# Patient Record
Sex: Male | Born: 1940 | Race: White | Hispanic: No | Marital: Married | Smoking: Former smoker
Health system: Southern US, Community
[De-identification: ages and names within clinical notes are randomized; demographics above are authoritative.]

## PROBLEM LIST (undated history)

## (undated) DIAGNOSIS — I5042 Chronic combined systolic (congestive) and diastolic (congestive) heart failure: Secondary | ICD-10-CM

## (undated) DIAGNOSIS — I447 Left bundle-branch block, unspecified: Secondary | ICD-10-CM

## (undated) DIAGNOSIS — Z95810 Presence of automatic (implantable) cardiac defibrillator: Secondary | ICD-10-CM

## (undated) DIAGNOSIS — I428 Other cardiomyopathies: Secondary | ICD-10-CM

## (undated) DIAGNOSIS — E119 Type 2 diabetes mellitus without complications: Secondary | ICD-10-CM

---

## 2000-11-13 ENCOUNTER — Other Ambulatory Visit: Admission: RE | Admit: 2000-11-13 | Discharge: 2000-11-13 | Payer: Self-pay | Admitting: Internal Medicine

## 2003-11-23 ENCOUNTER — Ambulatory Visit (HOSPITAL_COMMUNITY): Admission: RE | Admit: 2003-11-23 | Discharge: 2003-11-23 | Payer: Self-pay | Admitting: Internal Medicine

## 2006-07-07 ENCOUNTER — Ambulatory Visit: Payer: Self-pay | Admitting: Gastroenterology

## 2006-07-31 ENCOUNTER — Ambulatory Visit: Payer: Self-pay | Admitting: Gastroenterology

## 2006-07-31 ENCOUNTER — Encounter: Payer: Self-pay | Admitting: Gastroenterology

## 2006-07-31 ENCOUNTER — Ambulatory Visit (HOSPITAL_COMMUNITY): Admission: RE | Admit: 2006-07-31 | Discharge: 2006-07-31 | Payer: Self-pay | Admitting: Gastroenterology

## 2009-08-23 ENCOUNTER — Encounter (INDEPENDENT_AMBULATORY_CARE_PROVIDER_SITE_OTHER): Payer: Self-pay

## 2010-04-02 NOTE — Letter (Signed)
Summary: Recall Colonoscopy/Endoscopy, Change to Office Visit  [facility]  [address]   [place], [place] [address]   Phone: [phone]  Fax: [phone]      August 23, 2009   Richard Martin [address] [place], [place]  [address] 14-Apr-1940   Dear [name],   According to our records, it is time for you to schedule a Colonoscopy/Endoscopy. However, after reviewing your medical record, we recommend an office visit in order to determine your need for a repeat procedure.  Please call [phone] at your convenience to schedule an office visit. If you have any questions or concerns, please feel free to contact our office.   Sincerely,   Cloria Spring LPN  [facility] Ph: [phone]   Fax: [phone]

## 2010-06-20 ENCOUNTER — Other Ambulatory Visit: Payer: Self-pay | Admitting: Dermatology

## 2010-07-16 NOTE — Op Note (Signed)
NAMEGERROD, MAULE              ACCOUNT NO.:  [id number]   MEDICAL RECORD NO.:  [id number]          PATIENT TYPE:  AMB   LOCATION:  DAY                           FACILITY:  [facility]   PHYSICIAN:  Kassie Mends, M.D.      DATE OF BIRTH:  1941-02-23   DATE OF PROCEDURE:  07/31/2006  DATE OF DISCHARGE:                               OPERATIVE REPORT   PROCEDURE:  Colonoscopy with cold forceps colonoscopy, cold snare  polypectomy and snare cautery polypectomy.   INDICATIONS FOR EXAM:  Richard Martin is a 70 year old male who presented  with rectal bleeding. He has never had a colonoscopy.   FINDINGS:  1. 3 mm rectal polyp removed via cold forceps.  2. 6 mm sessile rectal polyp removed via snare cautery.  3. 8 mm transverse colon polyp removed via snare cautery but not      retrieved.  4. 6 mm pedunculated ascending colon polyp removed via snare cautery.  5. 6 mm sessile ascending colon polyp removed via snare cautery.  6. 8 mm sessile descending colon polyp removed via snare cautery.  7. 1.5 cm sessile rectal polyp removed via snare cautery. This polyp      is the likely source of his rectal bleeding. The surrounding mucosa      was discolored and biopsies of the surrounding mucosa were obtained      via cold forceps.  The underlying mucosa was tattooed with 3 mL of      [place] ink.  8. Two additional 3-4 mm rectal polyps removed via cold snare.  No      internal hemorrhoids.   RECOMMENDATIONS:  1. Low residue diet for seven days, then high fiber diet.  [name]      was given information on low residue diet, high fiber diet and      polyps.  2. Will call Mr. Perfect with his biopsy results.  3. Screening colonoscopy in three years.  4. No aspirin, NSAIDs, or anticoagulation for seven days.   MEDICATIONS:  1. Demerol 50 mg IV.  2. Versed 3 mg IV.   PROCEDURE TECHNIQUE:  Physical exam was performed and informed consent  was obtained from the patient after explaining the benefits,  risks and  alternatives to the procedure. The patient was connected to the monitor  and placed in left lateral position. Continuous oxygen was provided via  nasal cannula and IV medicines administered through an indwelling  cannula.  After administration of sedation and rectal exam, the  patient's rectum was intubated  and the scope was advanced under direct visualization to the cecum. The  scope was subsequently removed slowly by carefully examining the color,  texture, anatomy and integrity of the mucosa on the way out.  The  patient was recovered from endoscopy and discharged home in satisfactory  condition.      Kassie Mends, M.D.  Electronically Signed     SM/MEDQ  D:  07/31/2006  T:  07/31/2006  Job:  [id number]   cc:   Catalina Pizza, M.D.  Fax: [phone]

## 2010-07-16 NOTE — Consult Note (Signed)
Richard Martin, Richard Martin              ACCOUNT NO.:  [id number]   MEDICAL RECORD NO.:  [id number]         PATIENT TYPE:  AMB   LOCATION:  DAY                           FACILITY:  [facility]   PHYSICIAN:  Kassie Mends, M.D.      DATE OF BIRTH:  10/22/1940   DATE OF CONSULTATION:  07/07/2006  DATE OF DISCHARGE:                                 CONSULTATION   REFERRING PHYSICIAN:  Catalina Pizza, M.D.   REASON FOR CONSULTATION:  Rectal bleeding.   HISTORY OF PRESENT ILLNESS:  Richard Martin is a 70 year old male who has a  history of hemorrhoids.  He complains of rare rectal bleeding which he  attributes to his hemorrhoids over the years.  He has never had a  colonoscopy.  He denies any abdominal pain, problems swallowing,  heartburn, indigestion, change in his bowel habits, or black tarry  stools.   PAST MEDICAL HISTORY:  1. Diabetes controlled with diet.  2. Hyperlipidemia.   PAST SURGICAL HISTORY:  Tonsillectomy.   ALLERGIES:  CRAB.   MEDICATIONS:  1. Crestor 5 mg daily.  2. Aspirin 81 mg daily.  3. Multivitamin.   FAMILY HISTORY:  He has no family history of colon cancer or colon  polyps.   SOCIAL HISTORY:  He is married and retired since age 1.  He used to  work for a tobacco company.  He used to smoke but not now.  He  occasionally uses alcohol.   REVIEW OF SYSTEMS:  Per the HPI otherwise all systems are negative.   PHYSICAL EXAM:  VITAL SIGNS:  Weight 155 pounds, height 5 feet 10  inches, temperature 97.7, blood pressure was 122/80, pulse 61.  GENERAL:  He is in no apparent distress, alert and oriented x4.  HEENT:  Exam is  atraumatic, normocephalic.  Pupils equal, react to light.  Mouth no oral  lesions.  Posterior pharynx without erythema or exudate.  NECK:  Full range of motion.  No lymphadenopathy.  LUNGS:  Clear to  auscultation bilaterally.  CARDIOVASCULAR:  Regular rhythm, no murmur,  normal S1 and S2.  ABDOMEN:  Bowel sounds present, soft, nontender, nondistended.  No  rebound or guarding.  EXTREMITIES:  Without cyanosis, clubbing or edema.  NEURO:  He is in no focal neurologic deficits.   LABS:  Labs were drawn April2008: Hemoglobin 14.5, platelets 261. BUN  15, creatinine 0.65, albumin 4.3.   ASSESSMENT:  Richard Martin is 70 year old male who has rare rectal  bleeding which likely secondary to hemorrhoids but the differential  diagnosis includes colorectal polyp and a low likelihood of colorectal  malignancy.  Thank you for allowing me to see Richard Martin in  consultation.  My recommendations follow.   RECOMMENDATIONS:  1. Richard Martin will be scheduled for colonoscopy after May19 with a      HalfLytely bowel prep.  2. He may follow up with me as needed.      Kassie Mends, M.D.  Electronically Signed     SM/MEDQ  D:  07/07/2006  T:  07/07/2006  Job:  [id number]   cc:   Catalina Pizza, M.D.  Fax: [phone]

## 2011-06-05 DIAGNOSIS — L570 Actinic keratosis: Secondary | ICD-10-CM | POA: Diagnosis not present

## 2011-07-29 DIAGNOSIS — Z87891 Personal history of nicotine dependence: Secondary | ICD-10-CM | POA: Diagnosis not present

## 2011-07-29 DIAGNOSIS — Z136 Encounter for screening for cardiovascular disorders: Secondary | ICD-10-CM | POA: Diagnosis not present

## 2011-07-29 DIAGNOSIS — E119 Type 2 diabetes mellitus without complications: Secondary | ICD-10-CM | POA: Diagnosis not present

## 2011-07-29 DIAGNOSIS — E785 Hyperlipidemia, unspecified: Secondary | ICD-10-CM | POA: Diagnosis not present

## 2011-07-29 DIAGNOSIS — I10 Essential (primary) hypertension: Secondary | ICD-10-CM | POA: Diagnosis not present

## 2011-09-03 DIAGNOSIS — S81809A Unspecified open wound, unspecified lower leg, initial encounter: Secondary | ICD-10-CM | POA: Diagnosis not present

## 2011-09-03 DIAGNOSIS — B361 Tinea nigra: Secondary | ICD-10-CM | POA: Diagnosis not present

## 2011-12-05 ENCOUNTER — Other Ambulatory Visit: Payer: Self-pay | Admitting: Dermatology

## 2011-12-05 DIAGNOSIS — D485 Neoplasm of uncertain behavior of skin: Secondary | ICD-10-CM | POA: Diagnosis not present

## 2011-12-05 DIAGNOSIS — Z85828 Personal history of other malignant neoplasm of skin: Secondary | ICD-10-CM | POA: Diagnosis not present

## 2011-12-05 DIAGNOSIS — D239 Other benign neoplasm of skin, unspecified: Secondary | ICD-10-CM | POA: Diagnosis not present

## 2011-12-05 DIAGNOSIS — L570 Actinic keratosis: Secondary | ICD-10-CM | POA: Diagnosis not present

## 2011-12-05 DIAGNOSIS — L919 Hypertrophic disorder of the skin, unspecified: Secondary | ICD-10-CM | POA: Diagnosis not present

## 2011-12-05 DIAGNOSIS — L909 Atrophic disorder of skin, unspecified: Secondary | ICD-10-CM | POA: Diagnosis not present

## 2011-12-05 DIAGNOSIS — L821 Other seborrheic keratosis: Secondary | ICD-10-CM | POA: Diagnosis not present

## 2011-12-31 DIAGNOSIS — Z23 Encounter for immunization: Secondary | ICD-10-CM | POA: Diagnosis not present

## 2012-01-27 DIAGNOSIS — G609 Hereditary and idiopathic neuropathy, unspecified: Secondary | ICD-10-CM | POA: Diagnosis not present

## 2012-01-27 DIAGNOSIS — G589 Mononeuropathy, unspecified: Secondary | ICD-10-CM | POA: Diagnosis not present

## 2012-01-27 DIAGNOSIS — I10 Essential (primary) hypertension: Secondary | ICD-10-CM | POA: Diagnosis not present

## 2012-01-27 DIAGNOSIS — E119 Type 2 diabetes mellitus without complications: Secondary | ICD-10-CM | POA: Diagnosis not present

## 2012-01-27 DIAGNOSIS — Z125 Encounter for screening for malignant neoplasm of prostate: Secondary | ICD-10-CM | POA: Diagnosis not present

## 2012-01-27 DIAGNOSIS — E782 Mixed hyperlipidemia: Secondary | ICD-10-CM | POA: Diagnosis not present

## 2012-09-17 DIAGNOSIS — E119 Type 2 diabetes mellitus without complications: Secondary | ICD-10-CM | POA: Diagnosis not present

## 2012-09-17 DIAGNOSIS — I10 Essential (primary) hypertension: Secondary | ICD-10-CM | POA: Diagnosis not present

## 2012-09-17 DIAGNOSIS — H35379 Puckering of macula, unspecified eye: Secondary | ICD-10-CM | POA: Diagnosis not present

## 2012-09-17 DIAGNOSIS — E785 Hyperlipidemia, unspecified: Secondary | ICD-10-CM | POA: Diagnosis not present

## 2012-09-29 DIAGNOSIS — H35379 Puckering of macula, unspecified eye: Secondary | ICD-10-CM | POA: Diagnosis not present

## 2012-09-29 DIAGNOSIS — H524 Presbyopia: Secondary | ICD-10-CM | POA: Diagnosis not present

## 2012-12-08 ENCOUNTER — Other Ambulatory Visit: Payer: Self-pay | Admitting: Dermatology

## 2012-12-08 DIAGNOSIS — L820 Inflamed seborrheic keratosis: Secondary | ICD-10-CM | POA: Diagnosis not present

## 2012-12-08 DIAGNOSIS — Z85828 Personal history of other malignant neoplasm of skin: Secondary | ICD-10-CM | POA: Diagnosis not present

## 2012-12-08 DIAGNOSIS — L570 Actinic keratosis: Secondary | ICD-10-CM | POA: Diagnosis not present

## 2012-12-08 DIAGNOSIS — D692 Other nonthrombocytopenic purpura: Secondary | ICD-10-CM | POA: Diagnosis not present

## 2012-12-08 DIAGNOSIS — D485 Neoplasm of uncertain behavior of skin: Secondary | ICD-10-CM | POA: Diagnosis not present

## 2012-12-08 DIAGNOSIS — C44320 Squamous cell carcinoma of skin of unspecified parts of face: Secondary | ICD-10-CM | POA: Diagnosis not present

## 2012-12-08 DIAGNOSIS — L821 Other seborrheic keratosis: Secondary | ICD-10-CM | POA: Diagnosis not present

## 2012-12-20 DIAGNOSIS — Z85828 Personal history of other malignant neoplasm of skin: Secondary | ICD-10-CM | POA: Diagnosis not present

## 2012-12-20 DIAGNOSIS — C44320 Squamous cell carcinoma of skin of unspecified parts of face: Secondary | ICD-10-CM | POA: Diagnosis not present

## 2013-02-16 DIAGNOSIS — Z23 Encounter for immunization: Secondary | ICD-10-CM | POA: Diagnosis not present

## 2013-03-23 DIAGNOSIS — E782 Mixed hyperlipidemia: Secondary | ICD-10-CM | POA: Diagnosis not present

## 2013-03-23 DIAGNOSIS — Z125 Encounter for screening for malignant neoplasm of prostate: Secondary | ICD-10-CM | POA: Diagnosis not present

## 2013-03-23 DIAGNOSIS — E119 Type 2 diabetes mellitus without complications: Secondary | ICD-10-CM | POA: Diagnosis not present

## 2013-03-30 DIAGNOSIS — E782 Mixed hyperlipidemia: Secondary | ICD-10-CM | POA: Diagnosis not present

## 2013-03-30 DIAGNOSIS — E119 Type 2 diabetes mellitus without complications: Secondary | ICD-10-CM | POA: Diagnosis not present

## 2013-09-05 DIAGNOSIS — E782 Mixed hyperlipidemia: Secondary | ICD-10-CM | POA: Diagnosis not present

## 2013-09-05 DIAGNOSIS — E119 Type 2 diabetes mellitus without complications: Secondary | ICD-10-CM | POA: Diagnosis not present

## 2013-09-07 DIAGNOSIS — E782 Mixed hyperlipidemia: Secondary | ICD-10-CM | POA: Diagnosis not present

## 2013-09-07 DIAGNOSIS — I10 Essential (primary) hypertension: Secondary | ICD-10-CM | POA: Diagnosis not present

## 2013-09-07 DIAGNOSIS — E119 Type 2 diabetes mellitus without complications: Secondary | ICD-10-CM | POA: Diagnosis not present

## 2013-11-28 DIAGNOSIS — Z23 Encounter for immunization: Secondary | ICD-10-CM | POA: Diagnosis not present

## 2013-11-28 DIAGNOSIS — E119 Type 2 diabetes mellitus without complications: Secondary | ICD-10-CM | POA: Diagnosis not present

## 2013-12-16 DIAGNOSIS — J029 Acute pharyngitis, unspecified: Secondary | ICD-10-CM | POA: Diagnosis not present

## 2013-12-16 DIAGNOSIS — J069 Acute upper respiratory infection, unspecified: Secondary | ICD-10-CM | POA: Diagnosis not present

## 2014-02-02 ENCOUNTER — Other Ambulatory Visit: Payer: Self-pay | Admitting: Dermatology

## 2014-02-02 DIAGNOSIS — D1801 Hemangioma of skin and subcutaneous tissue: Secondary | ICD-10-CM | POA: Diagnosis not present

## 2014-02-02 DIAGNOSIS — D485 Neoplasm of uncertain behavior of skin: Secondary | ICD-10-CM | POA: Diagnosis not present

## 2014-02-02 DIAGNOSIS — Z85828 Personal history of other malignant neoplasm of skin: Secondary | ICD-10-CM | POA: Diagnosis not present

## 2014-02-02 DIAGNOSIS — L821 Other seborrheic keratosis: Secondary | ICD-10-CM | POA: Diagnosis not present

## 2014-02-02 DIAGNOSIS — L820 Inflamed seborrheic keratosis: Secondary | ICD-10-CM | POA: Diagnosis not present

## 2014-02-02 DIAGNOSIS — L570 Actinic keratosis: Secondary | ICD-10-CM | POA: Diagnosis not present

## 2014-03-08 DIAGNOSIS — E119 Type 2 diabetes mellitus without complications: Secondary | ICD-10-CM | POA: Diagnosis not present

## 2014-03-08 DIAGNOSIS — E785 Hyperlipidemia, unspecified: Secondary | ICD-10-CM | POA: Diagnosis not present

## 2014-03-15 DIAGNOSIS — E1165 Type 2 diabetes mellitus with hyperglycemia: Secondary | ICD-10-CM | POA: Diagnosis not present

## 2014-03-15 DIAGNOSIS — E782 Mixed hyperlipidemia: Secondary | ICD-10-CM | POA: Diagnosis not present

## 2014-06-21 DIAGNOSIS — E1165 Type 2 diabetes mellitus with hyperglycemia: Secondary | ICD-10-CM | POA: Diagnosis not present

## 2014-06-21 DIAGNOSIS — E782 Mixed hyperlipidemia: Secondary | ICD-10-CM | POA: Diagnosis not present

## 2014-06-28 DIAGNOSIS — E1165 Type 2 diabetes mellitus with hyperglycemia: Secondary | ICD-10-CM | POA: Diagnosis not present

## 2014-06-28 DIAGNOSIS — E782 Mixed hyperlipidemia: Secondary | ICD-10-CM | POA: Diagnosis not present

## 2014-10-25 DIAGNOSIS — E1165 Type 2 diabetes mellitus with hyperglycemia: Secondary | ICD-10-CM | POA: Diagnosis not present

## 2014-10-25 DIAGNOSIS — E782 Mixed hyperlipidemia: Secondary | ICD-10-CM | POA: Diagnosis not present

## 2014-10-25 DIAGNOSIS — E559 Vitamin D deficiency, unspecified: Secondary | ICD-10-CM | POA: Diagnosis not present

## 2014-11-01 DIAGNOSIS — E119 Type 2 diabetes mellitus without complications: Secondary | ICD-10-CM | POA: Diagnosis not present

## 2014-11-01 DIAGNOSIS — E782 Mixed hyperlipidemia: Secondary | ICD-10-CM | POA: Diagnosis not present

## 2014-12-04 ENCOUNTER — Emergency Department (HOSPITAL_COMMUNITY)
Admission: EM | Admit: 2014-12-04 | Discharge: 2014-12-04 | Disposition: A | Discharge status: Home / Self Care | Payer: Medicare Other | Attending: Emergency Medicine | Admitting: Emergency Medicine

## 2014-12-04 ENCOUNTER — Emergency Department (HOSPITAL_COMMUNITY)
Admission: EM | Admit: 2014-12-04 | Discharge: 2014-12-04 | Discharge status: Absent without leave | Payer: Self-pay | Source: Home / Self Care

## 2014-12-04 ENCOUNTER — Encounter (HOSPITAL_COMMUNITY): Payer: Self-pay | Admitting: Emergency Medicine

## 2014-12-04 DIAGNOSIS — Z87891 Personal history of nicotine dependence: Secondary | ICD-10-CM | POA: Insufficient documentation

## 2014-12-04 DIAGNOSIS — W228XXA Striking against or struck by other objects, initial encounter: Secondary | ICD-10-CM | POA: Insufficient documentation

## 2014-12-04 DIAGNOSIS — Y998 Other external cause status: Secondary | ICD-10-CM | POA: Diagnosis not present

## 2014-12-04 DIAGNOSIS — Y9289 Other specified places as the place of occurrence of the external cause: Secondary | ICD-10-CM | POA: Insufficient documentation

## 2014-12-04 DIAGNOSIS — S0591XA Unspecified injury of right eye and orbit, initial encounter: Secondary | ICD-10-CM | POA: Diagnosis present

## 2014-12-04 DIAGNOSIS — E119 Type 2 diabetes mellitus without complications: Secondary | ICD-10-CM | POA: Insufficient documentation

## 2014-12-04 DIAGNOSIS — Y9389 Activity, other specified: Secondary | ICD-10-CM | POA: Insufficient documentation

## 2014-12-04 DIAGNOSIS — Z23 Encounter for immunization: Secondary | ICD-10-CM | POA: Diagnosis not present

## 2014-12-04 DIAGNOSIS — H1131 Conjunctival hemorrhage, right eye: Secondary | ICD-10-CM | POA: Diagnosis not present

## 2014-12-04 DIAGNOSIS — Z791 Long term (current) use of non-steroidal anti-inflammatories (NSAID): Secondary | ICD-10-CM | POA: Insufficient documentation

## 2014-12-04 HISTORY — DX: Type 2 diabetes mellitus without complications: E11.9

## 2014-12-04 MED ORDER — TETANUS-DIPHTH-ACELL PERTUSSIS 5-2.5-18.5 LF-MCG/0.5 IM SUSP
0.5000 mL | Freq: Once | INTRAMUSCULAR | Status: AC
  Administered 2014-12-04: 0.5 mL via INTRAMUSCULAR
  Filled 2014-12-04: qty 0.5

## 2014-12-04 MED ORDER — FLUORESCEIN SODIUM 1 MG OP STRP
1.0000 | ORAL_STRIP | Freq: Once | OPHTHALMIC | Status: AC
  Administered 2014-12-04: 1 via OPHTHALMIC
  Filled 2014-12-04: qty 1

## 2014-12-04 MED ORDER — FLUORESCEIN SODIUM 1 MG OP STRP
ORAL_STRIP | OPHTHALMIC | Status: AC
  Filled 2014-12-04: qty 1

## 2014-12-04 MED ORDER — MOXIFLOXACIN HCL 0.5 % OP SOLN: 1.0000 [drp] | Freq: Three times a day (TID) | OPHTHALMIC | Status: DC

## 2014-12-04 NOTE — ED Provider Notes (Signed)
CSN: [id number]     Arrival date & time 12/04/14  1810 History  By signing my name below, I, Erling Conte, attest that this documentation has been prepared under the direction and in the presence of Ottie Glazier PA-C Electronically Signed: Erling Conte, ED Scribe. 12/04/2014. 9:55 PM.    Chief Complaint  Patient presents with  . Eye Injury    The history is provided by the patient. No language interpreter was used.    HPI Comments: Richard Martin is a 74 y.o. male with a h/o DM who presents to the Emergency Department complaining of sudden onset right eye injury onset just PTA. He states he hit his eye with with a possible branch or thorn from a plant that he was picking up. He reports associated eye redness. Pt does not wear glasses or contacts on a regular basis. He has not had any meds PTA. He endorses that the pain is exacerbated with eye movement. Pt states nothing makes the pain better. He denies any sensation of foreign body in right eye. Pt's tetanus status is not UTD. Pt takes 81 mg of aspirin daily. He denies any vision changes or facial swelling.  Past Medical History  Diagnosis Date  . Diabetes mellitus without complication ([place])    History reviewed. No pertinent past surgical history. No family history on file. Social History  Substance Use Topics  . Smoking status: Former Smoker    Types: Cigarettes  . Smokeless tobacco: None  . Alcohol Use: No    Review of Systems  HENT: Negative for facial swelling.   Eyes: Positive for pain and redness. Negative for visual disturbance.      Allergies  Crab  Home Medications   Prior to Admission medications   Medication Sig Start Date End Date Taking? Authorizing Provider  moxifloxacin (VIGAMOX) 0.5 % ophthalmic solution Place 1 drop into the right eye 3 (three) times daily. 12/04/14   Ottie Glazier, PA-C   Triage Vitals: BP 122/73 mmHg  Pulse 77  Temp(Src) 97.7 F (36.5 C) (Oral)  Resp 18  Ht 5\' 10"   (1.778 m)  Wt 140 lb 6.4 oz (63.685 kg)  BMI 20.15 kg/m2  SpO2 99%  Physical Exam  Constitutional: He is oriented to person, place, and time. He appears well-developed and well-nourished. No distress.  HENT:  Head: Normocephalic and atraumatic.  Eyes: EOM and lids are normal. Pupils are equal, round, and reactive to light. Right eye exhibits no discharge. No foreign body present in the right eye. Right conjunctiva has a hemorrhage. Left conjunctiva has no hemorrhage. Right eye exhibits normal extraocular motion. Left eye exhibits normal extraocular motion. Right pupil is round and reactive. Left pupil is round and reactive. Pupils are equal.  Slit lamp exam:      The right eye shows no hyphema.  Right eye: Conjunctiva is erythematous with hematoma on the lateral side but no corneal or pupil involvement. No hyphema. No globe injury. No foreign body could be seen.  The eye was stained with fluorescein after tetracaine was applied. There was no corneal abrasion or uptake noted.    Neck: Neck supple. No tracheal deviation present.  Cardiovascular: Normal rate.   Pulmonary/Chest: Effort normal. No respiratory distress.  Musculoskeletal: Normal range of motion.  Neurological: He is alert and oriented to person, place, and time.  Skin: Skin is warm and dry.  Psychiatric: He has a normal mood and affect. His behavior is normal.  Nursing note and vitals reviewed.  ED Course  Procedures (including critical care time) Labs Review Labs Reviewed - No data to display  Imaging Review No results found.   EKG Interpretation None       Visual Acuity  Right Eye Distance: 20/30 Left Eye Distance: 20/30 Bilateral Distance:    Right Eye Near:   Left Eye Near:    Bilateral Near:     MDM   Final diagnoses:  Conjunctival hemorrhage of right eye  Patient present for right injury while lifting plants today. There is no obvious globe injury or hyphema. He has a subconjunctival hemorrhage  with possible foreign body. I discussed this patient with Dr. Mingo Amber who has seen and evaluated the patient. The patient is not in pain now. He has no vision changes. He has no corneal or iris involvement. I spoke to Dr. Ellie Lunch, with ophthalmology, who recommended putting the patient on moxifloxacin and having him follow-up in the office tomorrow at 8:30 AM. I discussed the plan with the patient and he verbally agrees. I personally performed the services described in this documentation, which was scribed in my presence. The recorded information has been reviewed and is accurate.   Ottie Glazier, PA-C 12/04/14 2155  Evelina Bucy, MD 12/05/14 [phone]

## 2014-12-04 NOTE — ED Notes (Signed)
Pt left with all belongings and ambulated out of treatment area with wife.

## 2014-12-04 NOTE — ED Notes (Signed)
Pt reports he hit his eye with a stick. Sclera is dark red and inflamed. Pt is diabetic. Denies vision changes.

## 2014-12-04 NOTE — Discharge Instructions (Signed)
Subconjunctival Hemorrhage Follow-up with Dr. Ellie Lunch at 8:30 AM tomorrow morning. Take anabiotic says prescribed. Return for vision changes. A subconjunctival hemorrhage is a bright red patch covering a portion of the white of the eye. The white part of the eye is called the sclera, and it is covered by a thin membrane called the conjunctiva. This membrane is clear, except for tiny blood vessels that you can see with the naked eye. When your eye is irritated or inflamed and becomes red, it is because the vessels in the conjunctiva are swollen. Sometimes, a blood vessel in the conjunctiva can break and bleed. When this occurs, the blood builds up between the conjunctiva and the sclera, and spreads out to create a red area. The red spot may be very small at first. It may then spread to cover a larger part of the surface of the eye, or even all of the visible white part of the eye. In almost all cases, the blood will go away and the eye will become white again. Before completely dissolving, however, the red area may spread. It may also become brownish-yellow in color before going away. If a lot of blood collects under the conjunctiva, it may look like a bulge on the surface of the eye. This looks scary, but it will also eventually flatten out and go away. Subconjunctival hemorrhages do not cause pain, but if swollen, may cause a feeling of irritation. There is no effect on vision.  CAUSES   The most common cause is mild trauma (rubbing the eye, irritation).  Subconjunctival hemorrhages can happen because of coughing or straining (lifting heavy objects), vomiting, or sneezing.  In some cases, your doctor may want to check your blood pressure. High blood pressure can also cause a subconjunctival hemorrhage.  Severe trauma or blunt injuries.  Diseases that affect blood clotting (hemophilia, leukemia).  Abnormalities of blood vessels behind the eye (carotid cavernous sinus fistula).  Tumors behind the  eye.  Certain drugs (aspirin, Coumadin, heparin).  Recent eye surgery. HOME CARE INSTRUCTIONS   Do not worry about the appearance of your eye. You may continue your usual activities.  Often, follow-up is not necessary. SEEK MEDICAL CARE IF:   Your eye becomes painful.  The bleeding does not disappear within 3 weeks.  Bleeding occurs elsewhere, for example, under the skin, in the mouth, or in the other eye.  You have recurring subconjunctival hemorrhages. SEEK IMMEDIATE MEDICAL CARE IF:   Your vision changes or you have difficulty seeing.  You develop a severe headache, persistent vomiting, confusion, or abnormal drowsiness (lethargy).  Your eye seems to bulge or protrude from the eye socket.  You notice the sudden appearance of bruises or have spontaneous bleeding elsewhere on your body. Document Released: 02/17/2005 Document Revised: 07/04/2013 Document Reviewed: 01/15/2009 [facility] Patient Information 2015 [place], [place]. This information is not intended to replace advice given to you by your health care provider. Make sure you discuss any questions you have with your health care provider.

## 2014-12-05 DIAGNOSIS — T1501XA Foreign body in cornea, right eye, initial encounter: Secondary | ICD-10-CM | POA: Diagnosis not present

## 2014-12-05 DIAGNOSIS — H1131 Conjunctival hemorrhage, right eye: Secondary | ICD-10-CM | POA: Diagnosis not present

## 2014-12-05 DIAGNOSIS — S0501XA Injury of conjunctiva and corneal abrasion without foreign body, right eye, initial encounter: Secondary | ICD-10-CM | POA: Diagnosis not present

## 2014-12-08 DIAGNOSIS — S0501XD Injury of conjunctiva and corneal abrasion without foreign body, right eye, subsequent encounter: Secondary | ICD-10-CM | POA: Diagnosis not present

## 2014-12-12 DIAGNOSIS — S0501XD Injury of conjunctiva and corneal abrasion without foreign body, right eye, subsequent encounter: Secondary | ICD-10-CM | POA: Diagnosis not present

## 2014-12-15 DIAGNOSIS — S0501XD Injury of conjunctiva and corneal abrasion without foreign body, right eye, subsequent encounter: Secondary | ICD-10-CM | POA: Diagnosis not present

## 2014-12-15 DIAGNOSIS — H1131 Conjunctival hemorrhage, right eye: Secondary | ICD-10-CM | POA: Diagnosis not present

## 2014-12-22 DIAGNOSIS — S0501XD Injury of conjunctiva and corneal abrasion without foreign body, right eye, subsequent encounter: Secondary | ICD-10-CM | POA: Diagnosis not present

## 2015-02-07 DIAGNOSIS — Z85828 Personal history of other malignant neoplasm of skin: Secondary | ICD-10-CM | POA: Diagnosis not present

## 2015-02-07 DIAGNOSIS — L570 Actinic keratosis: Secondary | ICD-10-CM | POA: Diagnosis not present

## 2015-02-07 DIAGNOSIS — D1801 Hemangioma of skin and subcutaneous tissue: Secondary | ICD-10-CM | POA: Diagnosis not present

## 2015-02-07 DIAGNOSIS — L821 Other seborrheic keratosis: Secondary | ICD-10-CM | POA: Diagnosis not present

## 2015-02-07 DIAGNOSIS — L308 Other specified dermatitis: Secondary | ICD-10-CM | POA: Diagnosis not present

## 2015-03-20 DIAGNOSIS — E1165 Type 2 diabetes mellitus with hyperglycemia: Secondary | ICD-10-CM | POA: Diagnosis not present

## 2015-03-20 DIAGNOSIS — E782 Mixed hyperlipidemia: Secondary | ICD-10-CM | POA: Diagnosis not present

## 2015-03-22 DIAGNOSIS — E782 Mixed hyperlipidemia: Secondary | ICD-10-CM | POA: Diagnosis not present

## 2015-03-22 DIAGNOSIS — E119 Type 2 diabetes mellitus without complications: Secondary | ICD-10-CM | POA: Diagnosis not present

## 2015-04-05 DIAGNOSIS — J029 Acute pharyngitis, unspecified: Secondary | ICD-10-CM | POA: Diagnosis not present

## 2015-04-05 DIAGNOSIS — J40 Bronchitis, not specified as acute or chronic: Secondary | ICD-10-CM | POA: Diagnosis not present

## 2015-07-25 DIAGNOSIS — E119 Type 2 diabetes mellitus without complications: Secondary | ICD-10-CM | POA: Diagnosis not present

## 2015-07-25 DIAGNOSIS — E782 Mixed hyperlipidemia: Secondary | ICD-10-CM | POA: Diagnosis not present

## 2015-07-25 DIAGNOSIS — E559 Vitamin D deficiency, unspecified: Secondary | ICD-10-CM | POA: Diagnosis not present

## 2015-08-01 DIAGNOSIS — E782 Mixed hyperlipidemia: Secondary | ICD-10-CM | POA: Diagnosis not present

## 2015-08-01 DIAGNOSIS — E119 Type 2 diabetes mellitus without complications: Secondary | ICD-10-CM | POA: Diagnosis not present

## 2015-10-26 ENCOUNTER — Other Ambulatory Visit: Payer: Self-pay

## 2015-12-27 DIAGNOSIS — Z23 Encounter for immunization: Secondary | ICD-10-CM | POA: Diagnosis not present

## 2015-12-27 DIAGNOSIS — E782 Mixed hyperlipidemia: Secondary | ICD-10-CM | POA: Diagnosis not present

## 2015-12-27 DIAGNOSIS — E039 Hypothyroidism, unspecified: Secondary | ICD-10-CM | POA: Diagnosis not present

## 2015-12-27 DIAGNOSIS — E119 Type 2 diabetes mellitus without complications: Secondary | ICD-10-CM | POA: Diagnosis not present

## 2015-12-27 DIAGNOSIS — E559 Vitamin D deficiency, unspecified: Secondary | ICD-10-CM | POA: Diagnosis not present

## 2015-12-27 DIAGNOSIS — E785 Hyperlipidemia, unspecified: Secondary | ICD-10-CM | POA: Diagnosis not present

## 2015-12-27 DIAGNOSIS — N529 Male erectile dysfunction, unspecified: Secondary | ICD-10-CM | POA: Diagnosis not present

## 2015-12-27 DIAGNOSIS — R7301 Impaired fasting glucose: Secondary | ICD-10-CM | POA: Diagnosis not present

## 2015-12-27 DIAGNOSIS — I10 Essential (primary) hypertension: Secondary | ICD-10-CM | POA: Diagnosis not present

## 2016-01-01 DIAGNOSIS — E782 Mixed hyperlipidemia: Secondary | ICD-10-CM | POA: Diagnosis not present

## 2016-01-01 DIAGNOSIS — Z6820 Body mass index (BMI) 20.0-20.9, adult: Secondary | ICD-10-CM | POA: Diagnosis not present

## 2016-01-01 DIAGNOSIS — E119 Type 2 diabetes mellitus without complications: Secondary | ICD-10-CM | POA: Diagnosis not present

## 2016-01-16 DIAGNOSIS — T161XXA Foreign body in right ear, initial encounter: Secondary | ICD-10-CM | POA: Diagnosis not present

## 2016-03-05 DIAGNOSIS — J069 Acute upper respiratory infection, unspecified: Secondary | ICD-10-CM | POA: Diagnosis not present

## 2016-03-05 DIAGNOSIS — J329 Chronic sinusitis, unspecified: Secondary | ICD-10-CM | POA: Diagnosis not present

## 2016-04-16 DIAGNOSIS — H9209 Otalgia, unspecified ear: Secondary | ICD-10-CM | POA: Diagnosis not present

## 2016-04-23 DIAGNOSIS — L820 Inflamed seborrheic keratosis: Secondary | ICD-10-CM | POA: Diagnosis not present

## 2016-04-23 DIAGNOSIS — L821 Other seborrheic keratosis: Secondary | ICD-10-CM | POA: Diagnosis not present

## 2016-04-23 DIAGNOSIS — D1801 Hemangioma of skin and subcutaneous tissue: Secondary | ICD-10-CM | POA: Diagnosis not present

## 2016-04-23 DIAGNOSIS — L570 Actinic keratosis: Secondary | ICD-10-CM | POA: Diagnosis not present

## 2016-04-23 DIAGNOSIS — Z85828 Personal history of other malignant neoplasm of skin: Secondary | ICD-10-CM | POA: Diagnosis not present

## 2016-07-09 DIAGNOSIS — E119 Type 2 diabetes mellitus without complications: Secondary | ICD-10-CM | POA: Diagnosis not present

## 2016-07-09 DIAGNOSIS — E559 Vitamin D deficiency, unspecified: Secondary | ICD-10-CM | POA: Diagnosis not present

## 2016-07-09 DIAGNOSIS — I10 Essential (primary) hypertension: Secondary | ICD-10-CM | POA: Diagnosis not present

## 2016-07-11 DIAGNOSIS — Z6820 Body mass index (BMI) 20.0-20.9, adult: Secondary | ICD-10-CM | POA: Diagnosis not present

## 2016-07-11 DIAGNOSIS — Z0000 Encounter for general adult medical examination without abnormal findings: Secondary | ICD-10-CM | POA: Diagnosis not present

## 2016-07-11 DIAGNOSIS — E782 Mixed hyperlipidemia: Secondary | ICD-10-CM | POA: Diagnosis not present

## 2016-07-11 DIAGNOSIS — E119 Type 2 diabetes mellitus without complications: Secondary | ICD-10-CM | POA: Diagnosis not present

## 2016-08-29 DIAGNOSIS — H2513 Age-related nuclear cataract, bilateral: Secondary | ICD-10-CM | POA: Diagnosis not present

## 2016-08-29 DIAGNOSIS — E119 Type 2 diabetes mellitus without complications: Secondary | ICD-10-CM | POA: Diagnosis not present

## 2017-01-16 DIAGNOSIS — E782 Mixed hyperlipidemia: Secondary | ICD-10-CM | POA: Diagnosis not present

## 2017-01-16 DIAGNOSIS — E119 Type 2 diabetes mellitus without complications: Secondary | ICD-10-CM | POA: Diagnosis not present

## 2017-01-16 DIAGNOSIS — I10 Essential (primary) hypertension: Secondary | ICD-10-CM | POA: Diagnosis not present

## 2017-01-20 DIAGNOSIS — E782 Mixed hyperlipidemia: Secondary | ICD-10-CM | POA: Diagnosis not present

## 2017-01-20 DIAGNOSIS — Z23 Encounter for immunization: Secondary | ICD-10-CM | POA: Diagnosis not present

## 2017-01-20 DIAGNOSIS — E119 Type 2 diabetes mellitus without complications: Secondary | ICD-10-CM | POA: Diagnosis not present

## 2017-01-20 DIAGNOSIS — Z0000 Encounter for general adult medical examination without abnormal findings: Secondary | ICD-10-CM | POA: Diagnosis not present

## 2017-01-20 DIAGNOSIS — Z6820 Body mass index (BMI) 20.0-20.9, adult: Secondary | ICD-10-CM | POA: Diagnosis not present

## 2017-03-26 DIAGNOSIS — L821 Other seborrheic keratosis: Secondary | ICD-10-CM | POA: Diagnosis not present

## 2017-03-26 DIAGNOSIS — D485 Neoplasm of uncertain behavior of skin: Secondary | ICD-10-CM | POA: Diagnosis not present

## 2017-03-26 DIAGNOSIS — L570 Actinic keratosis: Secondary | ICD-10-CM | POA: Diagnosis not present

## 2017-03-26 DIAGNOSIS — Z85828 Personal history of other malignant neoplasm of skin: Secondary | ICD-10-CM | POA: Diagnosis not present

## 2017-07-24 DIAGNOSIS — E119 Type 2 diabetes mellitus without complications: Secondary | ICD-10-CM | POA: Diagnosis not present

## 2017-07-28 DIAGNOSIS — G589 Mononeuropathy, unspecified: Secondary | ICD-10-CM | POA: Diagnosis not present

## 2017-07-28 DIAGNOSIS — I10 Essential (primary) hypertension: Secondary | ICD-10-CM | POA: Diagnosis not present

## 2017-07-28 DIAGNOSIS — Z6820 Body mass index (BMI) 20.0-20.9, adult: Secondary | ICD-10-CM | POA: Diagnosis not present

## 2017-07-28 DIAGNOSIS — E119 Type 2 diabetes mellitus without complications: Secondary | ICD-10-CM | POA: Diagnosis not present

## 2017-07-28 DIAGNOSIS — E782 Mixed hyperlipidemia: Secondary | ICD-10-CM | POA: Diagnosis not present

## 2017-07-28 DIAGNOSIS — E559 Vitamin D deficiency, unspecified: Secondary | ICD-10-CM | POA: Diagnosis not present

## 2017-10-01 ENCOUNTER — Other Ambulatory Visit: Payer: Self-pay

## 2017-12-09 DIAGNOSIS — D2239 Melanocytic nevi of other parts of face: Secondary | ICD-10-CM | POA: Diagnosis not present

## 2017-12-09 DIAGNOSIS — Z85828 Personal history of other malignant neoplasm of skin: Secondary | ICD-10-CM | POA: Diagnosis not present

## 2017-12-09 DIAGNOSIS — L570 Actinic keratosis: Secondary | ICD-10-CM | POA: Diagnosis not present

## 2017-12-25 DIAGNOSIS — Z23 Encounter for immunization: Secondary | ICD-10-CM | POA: Diagnosis not present

## 2018-01-11 DIAGNOSIS — J069 Acute upper respiratory infection, unspecified: Secondary | ICD-10-CM | POA: Diagnosis not present

## 2018-01-11 DIAGNOSIS — J329 Chronic sinusitis, unspecified: Secondary | ICD-10-CM | POA: Diagnosis not present

## 2018-01-20 ENCOUNTER — Other Ambulatory Visit: Payer: Self-pay

## 2018-01-26 DIAGNOSIS — E119 Type 2 diabetes mellitus without complications: Secondary | ICD-10-CM | POA: Diagnosis not present

## 2018-01-26 DIAGNOSIS — I10 Essential (primary) hypertension: Secondary | ICD-10-CM | POA: Diagnosis not present

## 2018-01-26 DIAGNOSIS — E782 Mixed hyperlipidemia: Secondary | ICD-10-CM | POA: Diagnosis not present

## 2018-01-26 DIAGNOSIS — E559 Vitamin D deficiency, unspecified: Secondary | ICD-10-CM | POA: Diagnosis not present

## 2018-02-05 DIAGNOSIS — E559 Vitamin D deficiency, unspecified: Secondary | ICD-10-CM | POA: Diagnosis not present

## 2018-02-05 DIAGNOSIS — E782 Mixed hyperlipidemia: Secondary | ICD-10-CM | POA: Diagnosis not present

## 2018-02-05 DIAGNOSIS — E109 Type 1 diabetes mellitus without complications: Secondary | ICD-10-CM | POA: Diagnosis not present

## 2018-02-05 DIAGNOSIS — Z0000 Encounter for general adult medical examination without abnormal findings: Secondary | ICD-10-CM | POA: Diagnosis not present

## 2018-04-14 DIAGNOSIS — L570 Actinic keratosis: Secondary | ICD-10-CM | POA: Diagnosis not present

## 2018-04-14 DIAGNOSIS — Z85828 Personal history of other malignant neoplasm of skin: Secondary | ICD-10-CM | POA: Diagnosis not present

## 2018-08-03 DIAGNOSIS — E119 Type 2 diabetes mellitus without complications: Secondary | ICD-10-CM | POA: Diagnosis not present

## 2018-08-03 DIAGNOSIS — E782 Mixed hyperlipidemia: Secondary | ICD-10-CM | POA: Diagnosis not present

## 2018-08-03 DIAGNOSIS — I10 Essential (primary) hypertension: Secondary | ICD-10-CM | POA: Diagnosis not present

## 2018-08-03 DIAGNOSIS — E559 Vitamin D deficiency, unspecified: Secondary | ICD-10-CM | POA: Diagnosis not present

## 2018-08-10 DIAGNOSIS — E119 Type 2 diabetes mellitus without complications: Secondary | ICD-10-CM | POA: Diagnosis not present

## 2018-08-10 DIAGNOSIS — E782 Mixed hyperlipidemia: Secondary | ICD-10-CM | POA: Diagnosis not present

## 2018-08-10 DIAGNOSIS — E559 Vitamin D deficiency, unspecified: Secondary | ICD-10-CM | POA: Diagnosis not present

## 2018-10-04 ENCOUNTER — Other Ambulatory Visit: Payer: Self-pay

## 2018-12-28 DIAGNOSIS — E119 Type 2 diabetes mellitus without complications: Secondary | ICD-10-CM | POA: Diagnosis not present

## 2018-12-28 DIAGNOSIS — R9431 Abnormal electrocardiogram [ECG] [EKG]: Secondary | ICD-10-CM | POA: Diagnosis not present

## 2018-12-28 DIAGNOSIS — R55 Syncope and collapse: Secondary | ICD-10-CM | POA: Diagnosis not present

## 2018-12-31 ENCOUNTER — Ambulatory Visit (INDEPENDENT_AMBULATORY_CARE_PROVIDER_SITE_OTHER): Payer: Medicare Other | Admitting: Cardiology

## 2018-12-31 ENCOUNTER — Other Ambulatory Visit: Payer: Self-pay

## 2018-12-31 ENCOUNTER — Encounter: Payer: Self-pay | Admitting: Cardiology

## 2018-12-31 VITALS — BP 136/77 | HR 71 | Temp 97.1°F | Ht 70.0 in | Wt 138.0 lb

## 2018-12-31 DIAGNOSIS — E119 Type 2 diabetes mellitus without complications: Secondary | ICD-10-CM | POA: Diagnosis not present

## 2018-12-31 DIAGNOSIS — R55 Syncope and collapse: Secondary | ICD-10-CM | POA: Diagnosis not present

## 2018-12-31 DIAGNOSIS — E785 Hyperlipidemia, unspecified: Secondary | ICD-10-CM | POA: Diagnosis not present

## 2018-12-31 NOTE — Progress Notes (Signed)
Cardiology Office Note:    Date:  12/31/2018   ID:  [name], DOB 24-Jan-1941, MRN [id number]  PCP:  Celene Squibb, MD  Cardiologist:  No primary care provider on file.  Electrophysiologist:  None   Referring MD: Celene Squibb, MD   Chief Complaint  Patient presents with  . Loss of Consciousness    History of Present Illness:    Richard Martin is a 78 y.o. male with a hx of diabetes who is referred by Dr. Nevada Crane for initial evaluation of abnormal EKG and syncopal episode.  He reports that he was walking to his kitchen when he had sudden loss of consciousness.  States that he fell to the ground, was only unconscious for a few seconds.  No confusion afterwards.  Had been sitting in his chair prior to walking to the kitchen, states that position change happened about 30 seconds prior to syncopal episode.  He checked his blood pressure and it was elevated (SBP 170s), and pulse was normal (70s).  He did not report any other symptoms prior to to losing consciousness.  Denies any chest pain, dyspnea, or palpitations.  Has not had any episodes of syncope before or since the single episode.  He is on metformin for diabetes.  A1c 6.4%.  Takes rosuvastatin 10 mg daily.  Checks BP at home, reports has been 130s/60s.   Smoked for 40 years, quit 20 years ago.  Past Medical History:  Diagnosis Date  . Diabetes mellitus without complication ([place])     No past surgical history on file.  Current Medications: Current Meds  Medication Sig  . aspirin EC 81 MG tablet Take by mouth.  . cholecalciferol (VITAMIN D3) 25 MCG (1000 UT) tablet Take 1,000 Units by mouth daily.  . metFORMIN (GLUCOPHAGE) 500 MG tablet Take 500 mg by mouth 2 (two) times daily.  Marland Kitchen moxifloxacin (VIGAMOX) 0.5 % ophthalmic solution Place 1 drop into the right eye 3 (three) times daily.  . rosuvastatin (CRESTOR) 10 MG tablet   . zinc gluconate 50 MG tablet Take 50 mg by mouth daily.     Allergies:   Otho Darner allergy]    Social History   Socioeconomic History  . Marital status: Married    Spouse name: Not on file  . Number of children: Not on file  . Years of education: Not on file  . Highest education level: Not on file  Occupational History  . Not on file  Social Needs  . Financial resource strain: Not on file  . Food insecurity    Worry: Not on file    Inability: Not on file  . Transportation needs    Medical: Not on file    Non-medical: Not on file  Tobacco Use  . Smoking status: Former Smoker    Types: Cigarettes  . Smokeless tobacco: Never Used  Substance and Sexual Activity  . Alcohol use: No  . Drug use: No  . Sexual activity: Not on file  Lifestyle  . Physical activity    Days per week: Not on file    Minutes per session: Not on file  . Stress: Not on file  Relationships  . Social Herbalist on phone: Not on file    Gets together: Not on file    Attends religious service: Not on file    Active member of club or organization: Not on file    Attends meetings of clubs or organizations: Not on file  Relationship status: Not on file  Other Topics Concern  . Not on file  Social History Narrative  . Not on file     Family History: Father died of MI at age 27  ROS:   Please see the history of present illness.    All other systems reviewed and are negative.  EKGs/Labs/Other Studies Reviewed:    The following studies were reviewed today:   EKG:  EKG is ordered today.  The ekg ordered today demonstrates normal sinus rhythm, rate 80 bpm, right bundle branch block, left anterior fascicular block.  Rhythm strip demonstrates periods of 2:1 AV block  Recent Labs: No results found for requested labs within last 8760 hours.  Recent Lipid Panel No results found for: CHOL, TRIG, HDL, CHOLHDL, VLDL, LDLCALC, LDLDIRECT  Physical Exam:    VS:  BP 136/77 (BP Location: Left Wrist, Patient Position: Standing, Cuff Size: Normal)   Pulse 71   Temp (!) 97.1 F (36.2 C)    Ht 5\' 10"  (1.778 m)   Wt 138 lb (62.6 kg)   SpO2 96%   BMI 19.80 kg/m     Wt Readings from Last 3 Encounters:  12/31/18 138 lb (62.6 kg)  12/04/14 140 lb 6.4 oz (63.7 kg)     GEN:  Well nourished, well developed in no acute distress HEENT: Normal NECK: No JVD LYMPHATICS: No lymphadenopathy CARDIAC: irregular, bradycardic, no murmurs, rubs, gallops RESPIRATORY:  Clear to auscultation without rales, wheezing or rhonchi  ABDOMEN: Soft, non-tender, non-distended MUSCULOSKELETAL:  No edema; No deformity  SKIN: Warm and dry NEUROLOGIC:  Alert and oriented x 3 PSYCHIATRIC:  Normal affect   ASSESSMENT:    1. Syncope and collapse   2. Hyperlipidemia, unspecified hyperlipidemia type   3. Type 2 diabetes mellitus without complication, without long-term current use of insulin (HCC)    PLAN:    In order of problems listed above:  Syncope: Given sudden loss of consciousness with no prodrome, concerning for arrhythmia.  Normal orthostatics in clinic today.  Rhythm strip today demonstrates that he is having periods of 2:1 AV block.  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  On Metformin  RTC in 6 weeks   Medication Adjustments/Labs and Tests Ordered: Current medicines are reviewed at length with the patient today.  Concerns regarding medicines are outlined above.  Orders Placed This Encounter  Procedures  . LONG TERM MONITOR-LIVE TELEMETRY (3-14 DAYS)  . EKG 12-Lead  . ECHOCARDIOGRAM COMPLETE   No orders of the defined types were placed in this encounter.   Patient Instructions  Medication Instructions:  Continue same medications   Lab Work: None ordered   Testing/Procedures: Schedule Echo Schedule 14 day Live Zio Monitor  Follow-Up: At  [facility], you and your health needs are our priority.  As part of our continuing mission to provide you with exceptional heart care, we have created designated Provider Care Teams.  These Care Teams include your primary Cardiologist (physician) and Advanced Practice Providers (APPs -  Physician Assistants and Nurse Practitioners) who all work together to provide you with the care you need, when you need it.  Your next appointment:  6weeks   The format for your next appointment:  Office   Provider:  Dr.Alysse Rathe  Schedule appointment with EP for possible pacemaker     Signed, Donato Heinz, MD  12/31/2018 11:03 AM    [place]

## 2018-12-31 NOTE — Patient Instructions (Signed)
Medication Instructions:  Continue same medications   Lab Work: None ordered   Testing/Procedures: Schedule Echo Schedule 14 day Live Zio Monitor  Follow-Up: At [facility], you and your health needs are our priority.  As part of our continuing mission to provide you with exceptional heart care, we have created designated Provider Care Teams.  These Care Teams include your primary Cardiologist (physician) and Advanced Practice Providers (APPs -  Physician Assistants and Nurse Practitioners) who all work together to provide you with the care you need, when you need it.  Your next appointment:  6weeks   The format for your next appointment:  Office   Provider:  Dr.Schumann   Schedule appointment with EP for possible pacemaker

## 2019-01-01 DIAGNOSIS — Z23 Encounter for immunization: Secondary | ICD-10-CM | POA: Diagnosis not present

## 2019-01-04 ENCOUNTER — Other Ambulatory Visit: Payer: Self-pay

## 2019-01-04 ENCOUNTER — Ambulatory Visit (HOSPITAL_COMMUNITY): Payer: Medicare Other | Attending: Cardiology

## 2019-01-04 DIAGNOSIS — R55 Syncope and collapse: Secondary | ICD-10-CM | POA: Insufficient documentation

## 2019-01-06 ENCOUNTER — Other Ambulatory Visit: Payer: Self-pay

## 2019-01-06 ENCOUNTER — Ambulatory Visit (INDEPENDENT_AMBULATORY_CARE_PROVIDER_SITE_OTHER): Payer: Medicare Other | Admitting: Cardiology

## 2019-01-06 ENCOUNTER — Telehealth: Payer: Self-pay | Admitting: *Deleted

## 2019-01-06 VITALS — BP 148/84 | HR 84 | Ht 70.0 in | Wt 136.0 lb

## 2019-01-06 DIAGNOSIS — R55 Syncope and collapse: Secondary | ICD-10-CM

## 2019-01-06 DIAGNOSIS — I5021 Acute systolic (congestive) heart failure: Secondary | ICD-10-CM | POA: Diagnosis not present

## 2019-01-06 DIAGNOSIS — E785 Hyperlipidemia, unspecified: Secondary | ICD-10-CM | POA: Diagnosis not present

## 2019-01-06 MED ORDER — SODIUM CHLORIDE 0.9% FLUSH: 3.0000 mL | Freq: Two times a day (BID) | INTRAVENOUS | Status: DC

## 2019-01-06 MED ORDER — LISINOPRIL 5 MG PO TABS: 5.0000 mg | ORAL_TABLET | Freq: Every day | ORAL | 3 refills | Status: DC

## 2019-01-06 NOTE — Telephone Encounter (Signed)
14 day ZIO AT long term monitor -Live Telemetry mailed to the patients home.  Instructions reviewed briefly as they are included in the monitor kit.

## 2019-01-06 NOTE — H&P (View-Only) (Signed)
Cardiology Office Note:    Date:  01/06/2019   ID:  Richard Martin, DOB 03-Sep-1940, MRN [id number]  PCP:  Celene Squibb, MD  Cardiologist:  No primary care provider on file.  Electrophysiologist:  None   Referring MD: Celene Squibb, MD   Chief Complaint  Patient presents with  . Congestive Heart Failure    History of Present Illness:    Richard Martin is a 77 y.o. 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  He was initially seen on 12/31/2018 after being referred by Dr. Nevada Crane for evaluation of EKG abnormalities and syncopal episode.  He had a episode of syncope where he was walking to his kitchen and had sudden loss of consciousness.  Feels he was only unconscious for a few seconds and had no confusion afterwards.  Had been sitting in his chair prior to walk to the kitchen, states the position change happened about 30 seconds prior to syncopal episode.  He checked his blood pressure and it was elevated after the episode.  He did not report any symptoms prior to losing consciousness.  He was seen in clinic on 12/31/2018, and orthostatics were checked which were normal.  Rhythm strip demonstrated intermittent 2:1 AV block, with a wide QRS.  He was referred to EP for evaluation for pacemaker, and an echocardiogram was ordered.  TTE was done on 01/04/2019, which showed EF 40 to 45%, with aneurysmal basal inferior segment.  TTE was also notable for grade 2 diastolic dysfunction, normal RV function, no significant valvular disease.  Patient reports that he continues to remain very active.  States that he can walk for miles without stopping.  He denies any chest pain or dyspnea.    Past Medical History:  Diagnosis Date  . Diabetes mellitus without complication ([place])     No past surgical history on file.  Current Medications: Current Meds  Medication Sig  . aspirin EC 81 MG tablet Take 81 mg by mouth daily.   .  cholecalciferol (VITAMIN D3) 25 MCG (1000 UT) tablet Take 1,000 Units by mouth daily.  . metFORMIN (GLUCOPHAGE) 500 MG tablet Take 500 mg by mouth 2 (two) times daily.  . rosuvastatin (CRESTOR) 10 MG tablet Take 10 mg by mouth daily.   Marland Kitchen zinc gluconate 50 MG tablet Take 50 mg by mouth daily.  . [DISCONTINUED] moxifloxacin (VIGAMOX) 0.5 % ophthalmic solution Place 1 drop into the right eye 3 (three) times daily.   Current Facility-Administered Medications for the 01/06/19 encounter (Office Visit) with Donato Heinz, MD  Medication  . sodium chloride flush (NS) 0.9 % injection 3 mL     Allergies:   Crab [shellfish allergy]   Social History   Socioeconomic History  . Marital status: Married    Spouse name: Not on file  . Number of children: Not on file  . Years of education: Not on file  . Highest education level: Not on file  Occupational History  . Not on file  Social Needs  . Financial resource strain: Not on file  . Food insecurity    Worry: Not on file    Inability: Not on file  . Transportation needs    Medical: Not on file    Non-medical: Not on file  Tobacco Use  . Smoking status: Former Smoker    Types: Cigarettes  . Smokeless tobacco: Never Used  Substance and Sexual Activity  . Alcohol  use: No  . Drug use: No  . Sexual activity: Not on file  Lifestyle  . Physical activity    Days per week: Not on file    Minutes per session: Not on file  . Stress: Not on file  Relationships  . Social Herbalist on phone: Not on file    Gets together: Not on file    Attends religious service: Not on file    Active member of club or organization: Not on file    Attends meetings of clubs or organizations: Not on file    Relationship status: Not on file  Other Topics Concern  . Not on file  Social History Narrative  . Not on file     Family History: Father died of MI at age 64  ROS:   Please see the history of present illness.    All other systems  reviewed and are negative.  EKGs/Labs/Other Studies Reviewed:    The following studies were reviewed today:   EKG:  EKG is not ordered today.  The ekg ordered 12/31/18 demonstrates normal sinus rhythm, rate 80 bpm, right bundle branch block, left anterior fascicular block.  Rhythm strip demonstrates periods of 2:1 AV block.  Rhythm strip:   Recent Labs: No results found for requested labs within last 8760 hours.  Recent Lipid Panel No results found for: CHOL, TRIG, HDL, CHOLHDL, VLDL, LDLCALC, LDLDIRECT  Physical Exam:    VS:  BP (!) 148/84   Pulse 84   Ht 5\' 10"  (1.778 m)   Wt 136 lb (61.7 kg)   SpO2 99%   BMI 19.51 kg/m     Wt Readings from Last 3 Encounters:  01/06/19 136 lb (61.7 kg)  12/31/18 138 lb (62.6 kg)  12/04/14 140 lb 6.4 oz (63.7 kg)     GEN:  Well nourished, well developed in no acute distress HEENT: Normal NECK: No JVD LYMPHATICS: No lymphadenopathy CARDIAC: RRR, no murmurs, rubs, gallops RESPIRATORY:  Clear to auscultation without rales, wheezing or rhonchi  ABDOMEN: Soft, non-tender, non-distended MUSCULOSKELETAL:  No edema SKIN: Warm and dry NEUROLOGIC:  Alert and oriented x 3 PSYCHIATRIC:  Normal affect   ASSESSMENT:    1. Syncope and collapse   2. Hyperlipidemia, unspecified hyperlipidemia type   3. Acute systolic heart failure (HCC)    PLAN:     Systolic heart failure: new diagnosis.  EF 40-45%, with inferior akinesis and aneurysmal basal inferior segment.  Class I symptoms.  Denies any anginal symptoms. - Given new heart failure with regional wall motion abnormalities, will evaluate with coronary angiography - Likely not an entresto candidate given class I symptoms.  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  Normal orthostatics in clinic 10/30.  Rhythm strip demonstrated  that he is having periods of 2:1 AV block.  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  No recent lipids, will check lipid panel  Type 2 diabetes: Well-controlled, reports A1c 6.4%.  On Metformin  RTC in 1 month   Medication Adjustments/Labs and Tests Ordered: Current medicines are reviewed at length with the patient today.  Concerns regarding medicines are outlined above.  Orders Placed This Encounter  Procedures  . Basic metabolic panel  . CBC w/Diff/Platelet  . PT and PTT  . Lipid Profile   Meds ordered this encounter  Medications  . lisinopril (ZESTRIL) 5 MG tablet    Sig: Take 1 tablet (5 mg total) by mouth daily.    Dispense:  90 tablet    Refill:  3  . sodium chloride flush (NS) 0.9 % injection 3 mL    Patient Instructions  Medication Instructions:   Start taking Lisinopril 5 mg daily  Continue other medications  *If you need a refill on your cardiac medications before your next appointment, please call your pharmacy*  Lab Work: Bmet,cbc,pt,lipid panel to be done Monday 01/10/19 at office [place] enclosed   Testing/Procedures: Cardiac Cath scheduled Thursday 01/13/19   Follow instructions below  Follow-Up: At [facility], you and your health needs are our priority.  As part of our continuing mission to provide you with exceptional heart care, we have created designated Provider Care Teams.  These Care Teams include your primary Cardiologist (physician) and Advanced Practice Providers (APPs -  Physician Assistants and Nurse Practitioners) who all work together to provide you with the care you need, when you need it.  Your next appointment: Keep appointment already scheduled Monday 02/14/19 at 3:00 pm    The format for your next appointment:  Office  Provider:  [place] [address] Dept: [phone] Loc: [place]  01/06/2019  You are scheduled for a Cardiac Cath on Thursday 01/13/19, with Dr.Jordan.  1. Please arrive at the [facility] (Main Entrance A) at [facility]: [address] [place], [place] [address] at 8:30 am (This time is two hours before your procedure to ensure your preparation). Free valet parking service is available.   Special note: Every effort is made to have your procedure done on time. Please understand that emergencies sometimes delay scheduled procedures.  2. Diet: Do not eat solid foods after midnight.  The patient may have clear liquids until 5am upon the day of the procedure.  3. Labs: You will need to have blood drawn on  Monday 01/10/19 at Dr.Maha Fischel's office [facility] 10:30 or 11:00 am. You do not need to be fasting.Then you will need Covid test Monday 11/9 at 11:55 am.After Covid test you will need to quarantine until after Cath.  4. Medication instructions in preparation for your procedure:      Hold Metformin day before cath and morning of and Hold 2 days after Cath.       On the morning of your procedure, take your Aspirin 81 mg and any morning medicines NOT listed above.  You may use sips of water.  5. Plan for one night stay--bring personal belongings. 6. Bring a current list of your medications and current insurance cards. 7. You MUST have a responsible person to drive you home. 8. Someone MUST be with you the first 24 hours after you arrive home or your discharge will be delayed. 9. Please wear clothes that are easy to get on and off and wear slip-on shoes.  Thank you for allowing [place] to care for you!   -- [facility] Invasive Cardiovascular services      Signed, Donato Heinz, MD  01/06/2019 8:59 PM    [place]  [facility]

## 2019-01-06 NOTE — Progress Notes (Signed)
Cardiology Office Note:    Date:  01/06/2019   ID:  Richard Martin, DOB [birth year], MRN [id number]  PCP:  Celene Squibb, MD  Cardiologist:  No primary care provider on file.  Electrophysiologist:  None   Referring MD: Celene Squibb, MD   Chief Complaint  Patient presents with  . Congestive Heart Failure    History of Present Illness:    Richard Martin is a 78 y.o. 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  He was initially seen on 12/31/2018 after being referred by Dr. Nevada Crane for evaluation of EKG abnormalities and syncopal episode.  He had a episode of syncope where he was walking to his kitchen and had sudden loss of consciousness.  Feels he was only unconscious for a few seconds and had no confusion afterwards.  Had been sitting in his chair prior to walk to the kitchen, states the position change happened about 30 seconds prior to syncopal episode.  He checked his blood pressure and it was elevated after the episode.  He did not report any symptoms prior to losing consciousness.  He was seen in clinic on 12/31/2018, and orthostatics were checked which were normal.  Rhythm strip demonstrated intermittent 2:1 AV block, with a wide QRS.  He was referred to EP for evaluation for pacemaker, and an echocardiogram was ordered.  TTE was done on 01/04/2019, which showed EF 40 to 45%, with aneurysmal basal inferior segment.  TTE was also notable for grade 2 diastolic dysfunction, normal RV function, no significant valvular disease.  Patient reports that he continues to remain very active.  States that he can walk for miles without stopping.  He denies any chest pain or dyspnea.    Past Medical History:  Diagnosis Date  . Diabetes mellitus without complication ([place])     No past surgical history on file.  Current Medications: Current Meds  Medication Sig  . aspirin EC 81 MG tablet Take 81 mg by mouth daily.   .  cholecalciferol (VITAMIN D3) 25 MCG (1000 UT) tablet Take 1,000 Units by mouth daily.  . metFORMIN (GLUCOPHAGE) 500 MG tablet Take 500 mg by mouth 2 (two) times daily.  . rosuvastatin (CRESTOR) 10 MG tablet Take 10 mg by mouth daily.   Marland Kitchen zinc gluconate 50 MG tablet Take 50 mg by mouth daily.  . [DISCONTINUED] moxifloxacin (VIGAMOX) 0.5 % ophthalmic solution Place 1 drop into the right eye 3 (three) times daily.   Current Facility-Administered Medications for the 01/06/19 encounter (Office Visit) with Donato Heinz, MD  Medication  . sodium chloride flush (NS) 0.9 % injection 3 mL     Allergies:   Crab [shellfish allergy]   Social History   Socioeconomic History  . Marital status: Married    Spouse name: Not on file  . Number of children: Not on file  . Years of education: Not on file  . Highest education level: Not on file  Occupational History  . Not on file  Social Needs  . Financial resource strain: Not on file  . Food insecurity    Worry: Not on file    Inability: Not on file  . Transportation needs    Medical: Not on file    Non-medical: Not on file  Tobacco Use  . Smoking status: Former Smoker    Types: Cigarettes  . Smokeless tobacco: Never Used  Substance and Sexual Activity  . Alcohol  use: No  . Drug use: No  . Sexual activity: Not on file  Lifestyle  . Physical activity    Days per week: Not on file    Minutes per session: Not on file  . Stress: Not on file  Relationships  . Social Herbalist on phone: Not on file    Gets together: Not on file    Attends religious service: Not on file    Active member of club or organization: Not on file    Attends meetings of clubs or organizations: Not on file    Relationship status: Not on file  Other Topics Concern  . Not on file  Social History Narrative  . Not on file     Family History: Father died of MI at age 88  ROS:   Please see the history of present illness.    All other systems  reviewed and are negative.  EKGs/Labs/Other Studies Reviewed:    The following studies were reviewed today:   EKG:  EKG is not ordered today.  The ekg ordered 12/31/18 demonstrates normal sinus rhythm, rate 80 bpm, right bundle branch block, left anterior fascicular block.  Rhythm strip demonstrates periods of 2:1 AV block.  Rhythm strip:   Recent Labs: No results found for requested labs within last 8760 hours.  Recent Lipid Panel No results found for: CHOL, TRIG, HDL, CHOLHDL, VLDL, LDLCALC, LDLDIRECT  Physical Exam:    VS:  BP (!) 148/84   Pulse 84   Ht 5\' 10"  (1.778 m)   Wt 136 lb (61.7 kg)   SpO2 99%   BMI 19.51 kg/m     Wt Readings from Last 3 Encounters:  01/06/19 136 lb (61.7 kg)  12/31/18 138 lb (62.6 kg)  12/04/14 140 lb 6.4 oz (63.7 kg)     GEN:  Well nourished, well developed in no acute distress HEENT: Normal NECK: No JVD LYMPHATICS: No lymphadenopathy CARDIAC: RRR, no murmurs, rubs, gallops RESPIRATORY:  Clear to auscultation without rales, wheezing or rhonchi  ABDOMEN: Soft, non-tender, non-distended MUSCULOSKELETAL:  No edema SKIN: Warm and dry NEUROLOGIC:  Alert and oriented x 3 PSYCHIATRIC:  Normal affect   ASSESSMENT:    1. Syncope and collapse   2. Hyperlipidemia, unspecified hyperlipidemia type   3. Acute systolic heart failure (HCC)    PLAN:     Systolic heart failure: new diagnosis.  EF 40-45%, with inferior akinesis and aneurysmal basal inferior segment.  Class I symptoms.  Denies any anginal symptoms. - Given new heart failure with regional wall motion abnormalities, will evaluate with coronary angiography - Likely not an entresto candidate given class I symptoms.  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  Normal orthostatics in clinic 10/30.  Rhythm strip demonstrated  that he is having periods of 2:1 AV block.  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  No recent lipids, will check lipid panel  Type 2 diabetes: Well-controlled, reports A1c 6.4%.  On Metformin  RTC in 1 month   Medication Adjustments/Labs and Tests Ordered: Current medicines are reviewed at length with the patient today.  Concerns regarding medicines are outlined above.  Orders Placed This Encounter  Procedures  . Basic metabolic panel  . CBC w/Diff/Platelet  . PT and PTT  . Lipid Profile   Meds ordered this encounter  Medications  . lisinopril (ZESTRIL) 5 MG tablet    Sig: Take 1 tablet (5 mg total) by mouth daily.    Dispense:  90 tablet    Refill:  3  . sodium chloride flush (NS) 0.9 % injection 3 mL    Patient Instructions  Medication Instructions:   Start taking Lisinopril 5 mg daily  Continue other medications  *If you need a refill on your cardiac medications before your next appointment, please call your pharmacy*  Lab Work: Bmet,cbc,pt,lipid panel to be done Monday 01/10/19 at office [place] enclosed   Testing/Procedures: Cardiac Cath scheduled Thursday 01/13/19   Follow instructions below  Follow-Up: At [facility], you and your health needs are our priority.  As part of our continuing mission to provide you with exceptional heart care, we have created designated Provider Care Teams.  These Care Teams include your primary Cardiologist (physician) and Advanced Practice Providers (APPs -  Physician Assistants and Nurse Practitioners) who all work together to provide you with the care you need, when you need it.  Your next appointment: Keep appointment already scheduled Monday 02/14/19 at 3:00 pm    The format for your next appointment:  Office  Provider:  [place] [address] Dept: [phone] Loc: [place]  01/06/2019  You are scheduled for a Cardiac Cath on Thursday 01/13/19, with Dr.Jordan.  1. Please arrive at the [facility] (Main Entrance A) at [facility]: [address] [place], [place] [address] at 8:30 am (This time is two hours before your procedure to ensure your preparation). Free valet parking service is available.   Special note: Every effort is made to have your procedure done on time. Please understand that emergencies sometimes delay scheduled procedures.  2. Diet: Do not eat solid foods after midnight.  The patient may have clear liquids until 5am upon the day of the procedure.  3. Labs: You will need to have blood drawn on  Monday 01/10/19 at Dr.Tenessa Marsee's office [facility] 10:30 or 11:00 am. You do not need to be fasting.Then you will need Covid test Monday 11/9 at 11:55 am.After Covid test you will need to quarantine until after Cath.  4. Medication instructions in preparation for your procedure:      Hold Metformin day before cath and morning of and Hold 2 days after Cath.       On the morning of your procedure, take your Aspirin 81 mg and any morning medicines NOT listed above.  You may use sips of water.  5. Plan for one night stay--bring personal belongings. 6. Bring a current list of your medications and current insurance cards. 7. You MUST have a responsible person to drive you home. 8. Someone MUST be with you the first 24 hours after you arrive home or your discharge will be delayed. 9. Please wear clothes that are easy to get on and off and wear slip-on shoes.  Thank you for allowing [place] to care for you!   -- [facility] Invasive Cardiovascular services      Signed, Donato Heinz, MD  01/06/2019 8:59 PM    [place]  [facility]

## 2019-01-06 NOTE — Patient Instructions (Addendum)
Medication Instructions:   Start taking Lisinopril 5 mg daily  Continue other medications  *If you need a refill on your cardiac medications before your next appointment, please call your pharmacy*  Lab Work: Bmet,cbc,pt,lipid panel to be done Monday 01/10/19 at office [place] enclosed   Testing/Procedures: Cardiac Cath scheduled Thursday 01/13/19   Follow instructions below  Follow-Up: At [facility], you and your health needs are our priority.  As part of our continuing mission to provide you with exceptional heart care, we have created designated Provider Care Teams.  These Care Teams include your primary Cardiologist (physician) and Advanced Practice Providers (APPs -  Physician Assistants and Nurse Practitioners) who all work together to provide you with the care you need, when you need it.  Your next appointment: Keep appointment already scheduled Monday 02/14/19 at 3:00 pm    The format for your next appointment:  Office  Provider:  [place] [address] Dept: [phone] Loc: [place]  01/06/2019  You are scheduled for a Cardiac Cath on Thursday 01/13/19, with Dr.Jordan.  1. Please arrive at the [facility] (Main Entrance A) at [facility]: [address] [place], [place] [address] at 8:30 am (This time is two hours before your procedure to ensure your preparation). Free valet parking service is available.   Special note: Every effort is made to have your procedure done on time. Please understand that emergencies sometimes delay scheduled procedures.  2. Diet: Do not eat solid foods after midnight.  The patient may have clear liquids until 5am upon the day of the procedure.  3. Labs: You will need to have blood drawn on  Monday 01/10/19 at Dr.Schumann's office [facility] 10:30 or 11:00 am. You  do not need to be fasting.Then you will need Covid test Monday 11/9 at 11:55 am.After Covid test you will need to quarantine until after Cath.  4. Medication instructions in preparation for your procedure:      Hold Metformin day before cath and morning of and Hold 2 days after Cath.       On the morning of your procedure, take your Aspirin 81 mg and any morning medicines NOT listed above.  You may use sips of water.  5. Plan for one night stay--bring personal belongings. 6. Bring a current list of your medications and current insurance cards. 7. You MUST have a responsible person to drive you home. 8. Someone MUST be with you the first 24 hours after you arrive home or your discharge will be delayed. 9. Please wear clothes that are easy to get on and off and wear slip-on shoes.  Thank you for allowing [place] to care for you!   -- [place]  Invasive Cardiovascular services

## 2019-01-10 ENCOUNTER — Other Ambulatory Visit (HOSPITAL_COMMUNITY)
Admission: RE | Admit: 2019-01-10 | Discharge: 2019-01-10 | Disposition: A | Discharge status: Home / Self Care | Payer: Medicare Other | Source: Ambulatory Visit | Attending: Cardiology | Admitting: Cardiology

## 2019-01-10 DIAGNOSIS — Z01812 Encounter for preprocedural laboratory examination: Secondary | ICD-10-CM | POA: Diagnosis not present

## 2019-01-10 DIAGNOSIS — I5021 Acute systolic (congestive) heart failure: Secondary | ICD-10-CM | POA: Diagnosis not present

## 2019-01-10 DIAGNOSIS — Z20828 Contact with and (suspected) exposure to other viral communicable diseases: Secondary | ICD-10-CM | POA: Diagnosis not present

## 2019-01-10 DIAGNOSIS — R55 Syncope and collapse: Secondary | ICD-10-CM | POA: Diagnosis not present

## 2019-01-10 DIAGNOSIS — E785 Hyperlipidemia, unspecified: Secondary | ICD-10-CM | POA: Diagnosis not present

## 2019-01-11 LAB — CBC WITH DIFFERENTIAL/PLATELET
Basophils Absolute: 0.1 10*3/uL (ref 0.0–0.2)
Basos: 1 %
EOS (ABSOLUTE): 0 10*3/uL (ref 0.0–0.4)
Eos: 1 %
Hematocrit: 41.9 % (ref 37.5–51.0)
Hemoglobin: 14.4 g/dL (ref 13.0–17.7)
Immature Grans (Abs): 0 10*3/uL (ref 0.0–0.1)
Immature Granulocytes: 0 %
Lymphocytes Absolute: 1 10*3/uL (ref 0.7–3.1)
Lymphs: 21 %
MCH: 31.2 pg (ref 26.6–33.0)
MCHC: 34.4 g/dL (ref 31.5–35.7)
MCV: 91 fL (ref 79–97)
Monocytes Absolute: 0.4 10*3/uL (ref 0.1–0.9)
Monocytes: 9 %
Neutrophils Absolute: 3.2 10*3/uL (ref 1.4–7.0)
Neutrophils: 68 %
Platelets: 254 10*3/uL (ref 150–450)
RBC: 4.62 x10E6/uL (ref 4.14–5.80)
RDW: 12.1 % (ref 11.6–15.4)
WBC: 4.8 10*3/uL (ref 3.4–10.8)

## 2019-01-11 LAB — PT AND PTT
INR: 1 (ref 0.9–1.2)
Prothrombin Time: 10.8 s (ref 9.1–12.0)
aPTT: 28 s (ref 24–33)

## 2019-01-11 LAB — BASIC METABOLIC PANEL
BUN/Creatinine Ratio: 29 — ABNORMAL HIGH (ref 10–24)
BUN: 18 mg/dL (ref 8–27)
CO2: 26 mmol/L (ref 20–29)
Calcium: 9.9 mg/dL (ref 8.6–10.2)
Chloride: 98 mmol/L (ref 96–106)
Creatinine, Ser: 0.63 mg/dL — ABNORMAL LOW (ref 0.76–1.27)
GFR calc Af Amer: 110 mL/min/{1.73_m2} (ref >59)
GFR calc non Af Amer: 95 mL/min/{1.73_m2} (ref >59)
Glucose: 130 mg/dL — ABNORMAL HIGH (ref 65–99)
Potassium: 4.8 mmol/L (ref 3.5–5.2)
Sodium: 138 mmol/L (ref 134–144)

## 2019-01-11 LAB — LIPID PANEL
Chol/HDL Ratio: 2.4 ratio (ref 0.0–5.0)
Cholesterol, Total: 123 mg/dL (ref 100–199)
HDL: 52 mg/dL (ref >39)
LDL Chol Calc (NIH): 57 mg/dL (ref 0–99)
Triglycerides: 66 mg/dL (ref 0–149)
VLDL Cholesterol Cal: 14 mg/dL (ref 5–40)

## 2019-01-11 LAB — NOVEL CORONAVIRUS, NAA (HOSP ORDER, SEND-OUT TO REF LAB; TAT 18-24 HRS): SARS-CoV-2, NAA: NOT DETECTED

## 2019-01-12 ENCOUNTER — Telehealth: Payer: Self-pay | Admitting: *Deleted

## 2019-01-12 NOTE — Telephone Encounter (Signed)
Pt contacted pre-catheterization scheduled at [facility] for: Thursday January 13, 2019 10:30 AM Verified arrival time and place: [place] [facility]) at: 8:30 AM    No solid food after midnight prior to cath, clear liquids until 5 AM day of procedure. Contrast allergy:-pt denies contrast allergy  Hold: Metformin-day of procedure and 48 hours post procedure.  Except hold medications AM meds can be  taken pre-cath with sip of water including: ASA 81 mg   Confirmed patient has responsible adult to drive home post procedure and observe 24 hours after arriving home: yes  Currently, due to Covid-19 pandemic, only one support person will be allowed with patient. Must be the same support person for that patient's entire stay, will be screened and required to wear a mask. They will be asked to wait in the waiting room for the duration of the patient's stay.  Patients are required to wear a mask when they enter the hospital.     COVID-19 Pre-Screening Questions:  . In the past 7 to 10 days have you had a cough,  shortness of breath, headache, congestion, fever (100 or greater) body aches, chills, sore throat, or sudden loss of taste or sense of smell? Shortness of breath-not new . Have you been around anyone with known Covid 19? no . Have you been around anyone who is awaiting Covid 19 test results in the past 7 to 10 days? no . Have you been around anyone who has been exposed to Covid 19, or has mentioned symptoms of Covid 19 within the past 7 to 10 days? no   I reviewed procedure/mask/visitor instructions, Covid-19 screening questions with patient, he verbalized understanding, thanked me for call.

## 2019-01-13 ENCOUNTER — Ambulatory Visit (HOSPITAL_COMMUNITY)
Admission: RE | Admit: 2019-01-13 | Discharge: 2019-01-13 | Disposition: A | Discharge status: Home / Self Care | Payer: Medicare Other | Attending: Cardiology | Admitting: Cardiology

## 2019-01-13 ENCOUNTER — Encounter (HOSPITAL_COMMUNITY): Payer: Self-pay | Admitting: Cardiology

## 2019-01-13 ENCOUNTER — Encounter (HOSPITAL_COMMUNITY)
Admission: RE | Disposition: A | Discharge status: Home / Self Care | Payer: Self-pay | Source: Home / Self Care | Attending: Cardiology

## 2019-01-13 ENCOUNTER — Other Ambulatory Visit: Payer: Self-pay

## 2019-01-13 DIAGNOSIS — Z7984 Long term (current) use of oral hypoglycemic drugs: Secondary | ICD-10-CM | POA: Insufficient documentation

## 2019-01-13 DIAGNOSIS — Z79899 Other long term (current) drug therapy: Secondary | ICD-10-CM | POA: Diagnosis not present

## 2019-01-13 DIAGNOSIS — E118 Type 2 diabetes mellitus with unspecified complications: Secondary | ICD-10-CM | POA: Diagnosis present

## 2019-01-13 DIAGNOSIS — E119 Type 2 diabetes mellitus without complications: Secondary | ICD-10-CM | POA: Insufficient documentation

## 2019-01-13 DIAGNOSIS — Z7982 Long term (current) use of aspirin: Secondary | ICD-10-CM | POA: Insufficient documentation

## 2019-01-13 DIAGNOSIS — R55 Syncope and collapse: Secondary | ICD-10-CM | POA: Diagnosis not present

## 2019-01-13 DIAGNOSIS — Z87891 Personal history of nicotine dependence: Secondary | ICD-10-CM | POA: Diagnosis not present

## 2019-01-13 DIAGNOSIS — E785 Hyperlipidemia, unspecified: Secondary | ICD-10-CM | POA: Insufficient documentation

## 2019-01-13 DIAGNOSIS — I5021 Acute systolic (congestive) heart failure: Secondary | ICD-10-CM | POA: Diagnosis not present

## 2019-01-13 DIAGNOSIS — I255 Ischemic cardiomyopathy: Secondary | ICD-10-CM | POA: Diagnosis present

## 2019-01-13 HISTORY — PX: LEFT HEART CATH AND CORONARY ANGIOGRAPHY: CATH118249

## 2019-01-13 LAB — GLUCOSE, CAPILLARY: Glucose-Capillary: 122 mg/dL — ABNORMAL HIGH (ref 70–99)

## 2019-01-13 SURGERY — LEFT HEART CATH AND CORONARY ANGIOGRAPHY
Anesthesia: LOCAL

## 2019-01-13 MED ORDER — VERAPAMIL HCL 2.5 MG/ML IV SOLN
INTRAVENOUS | Status: DC | PRN
  Administered 2019-01-13: 10:00:00 10 mL via INTRA_ARTERIAL

## 2019-01-13 MED ORDER — HEPARIN SODIUM (PORCINE) 1000 UNIT/ML IJ SOLN
INTRAMUSCULAR | Status: AC
  Filled 2019-01-13: qty 1

## 2019-01-13 MED ORDER — LIDOCAINE HCL (PF) 1 % IJ SOLN
INTRAMUSCULAR | Status: DC | PRN
  Administered 2019-01-13: 2 mL

## 2019-01-13 MED ORDER — MIDAZOLAM HCL 2 MG/2ML IJ SOLN
INTRAMUSCULAR | Status: DC | PRN
  Administered 2019-01-13: 1 mg via INTRAVENOUS

## 2019-01-13 MED ORDER — MIDAZOLAM HCL 2 MG/2ML IJ SOLN
INTRAMUSCULAR | Status: AC
  Filled 2019-01-13: qty 2

## 2019-01-13 MED ORDER — LIDOCAINE HCL (PF) 1 % IJ SOLN
INTRAMUSCULAR | Status: AC
  Filled 2019-01-13: qty 30

## 2019-01-13 MED ORDER — SODIUM CHLORIDE 0.9 % IV SOLN: 250.0000 mL | INTRAVENOUS | Status: DC | PRN

## 2019-01-13 MED ORDER — METFORMIN HCL 500 MG PO TABS: 500.0000 mg | ORAL_TABLET | Freq: Two times a day (BID) | ORAL | Status: AC

## 2019-01-13 MED ORDER — FENTANYL CITRATE (PF) 100 MCG/2ML IJ SOLN
INTRAMUSCULAR | Status: AC
  Filled 2019-01-13: qty 2

## 2019-01-13 MED ORDER — SODIUM CHLORIDE 0.9 % IV SOLN
INTRAVENOUS | Status: DC
  Administered 2019-01-13: 09:00:00 via INTRAVENOUS

## 2019-01-13 MED ORDER — SODIUM CHLORIDE 0.9 % WEIGHT BASED INFUSION: 1.0000 mL/kg/h | INTRAVENOUS | Status: DC

## 2019-01-13 MED ORDER — ONDANSETRON HCL 4 MG/2ML IJ SOLN: 4.0000 mg | Freq: Four times a day (QID) | INTRAMUSCULAR | Status: DC | PRN

## 2019-01-13 MED ORDER — ACETAMINOPHEN 325 MG PO TABS: 650.0000 mg | ORAL_TABLET | ORAL | Status: DC | PRN

## 2019-01-13 MED ORDER — HEPARIN (PORCINE) IN NACL 1000-0.9 UT/500ML-% IV SOLN
INTRAVENOUS | Status: AC
  Filled 2019-01-13: qty 1000

## 2019-01-13 MED ORDER — FENTANYL CITRATE (PF) 100 MCG/2ML IJ SOLN
INTRAMUSCULAR | Status: DC | PRN
  Administered 2019-01-13: 25 ug via INTRAVENOUS

## 2019-01-13 MED ORDER — IOHEXOL 350 MG/ML SOLN
INTRAVENOUS | Status: DC | PRN
  Administered 2019-01-13: 11:00:00 50 mL

## 2019-01-13 MED ORDER — HEPARIN SODIUM (PORCINE) 1000 UNIT/ML IJ SOLN
INTRAMUSCULAR | Status: DC | PRN
  Administered 2019-01-13: 3000 [IU] via INTRAVENOUS

## 2019-01-13 MED ORDER — SODIUM CHLORIDE 0.9% FLUSH: 3.0000 mL | Freq: Two times a day (BID) | INTRAVENOUS | Status: DC

## 2019-01-13 MED ORDER — VERAPAMIL HCL 2.5 MG/ML IV SOLN
INTRAVENOUS | Status: AC
  Filled 2019-01-13: qty 2

## 2019-01-13 MED ORDER — HEPARIN (PORCINE) IN NACL 1000-0.9 UT/500ML-% IV SOLN
INTRAVENOUS | Status: DC | PRN
  Administered 2019-01-13 (×2): 500 mL

## 2019-01-13 MED ORDER — SODIUM CHLORIDE 0.9% FLUSH: 3.0000 mL | INTRAVENOUS | Status: DC | PRN

## 2019-01-13 SURGICAL SUPPLY — 10 items
CATH 5FR JL3.5 JR4 ANG PIG MP (CATHETERS) ×2 IMPLANT
DEVICE RAD COMP TR BAND LRG (VASCULAR PRODUCTS) ×2 IMPLANT
GLIDESHEATH SLEND SS 6F .021 (SHEATH) ×2 IMPLANT
GUIDEWIRE INQWIRE 1.5J.035X260 (WIRE) ×1 IMPLANT
INQWIRE 1.5J .035X260CM (WIRE) ×2
KIT HEART LEFT (KITS) ×2 IMPLANT
PACK CARDIAC CATHETERIZATION (CUSTOM PROCEDURE TRAY) ×2 IMPLANT
SYR MEDRAD MARK 7 150ML (SYRINGE) ×2 IMPLANT
TRANSDUCER W/STOPCOCK (MISCELLANEOUS) ×2 IMPLANT
TUBING CIL FLEX 10 FLL-RA (TUBING) ×2 IMPLANT

## 2019-01-13 NOTE — Discharge Instructions (Signed)
Radial  Radial Site Care  This sheet gives you information about how to care for yourself after your procedure. Your health care provider may also give you more specific instructions. If you have problems or questions, contact your health care provider. What can I expect after the procedure? After the procedure, it is common to have:  Bruising and tenderness at the catheter insertion area. Follow these instructions at home: Medicines  Take over-the-counter and prescription medicines only as told by your health care provider. Insertion site care  Follow instructions from your health care provider about how to take care of your insertion site. Make sure you: ? Wash your hands with soap and water before you change your bandage (dressing). If soap and water are not available, use hand sanitizer. ? Change your dressing as told by your health care provider. ? Leave stitches (sutures), skin glue, or adhesive strips in place. These skin closures may need to stay in place for 2 weeks or longer. If adhesive strip edges start to loosen and curl up, you may trim the loose edges. Do not remove adhesive strips completely unless your health care provider tells you to do that.  Check your insertion site every day for signs of infection. Check for: ? Redness, swelling, or pain. ? Fluid or blood. ? Pus or a bad smell. ? Warmth.  Do not take baths, swim, or use a hot tub until your health care provider approves.  You may shower 24-48 hours after the procedure, or as directed by your health care provider. ? Remove the dressing and gently wash the site with plain soap and water. ? Pat the area dry with a clean towel. ? Do not rub the site. That could cause bleeding.  Do not apply powder or lotion to the site. Activity   For 24 hours after the procedure, or as directed by your health care provider: ? Do not flex or bend the affected arm. ? Do not push or pull heavy objects with the affected  arm. ? Do not drive yourself home from the hospital or clinic. You may drive 24 hours after the procedure unless your health care provider tells you not to. ? Do not operate machinery or power tools.  Do not lift anything that is heavier than 10 lb (4.5 kg), or the limit that you are told, until your health care provider says that it is safe.  Ask your health care provider when it is okay to: ? Return to work or school. ? Resume usual physical activities or sports. ? Resume sexual activity. General instructions  If the catheter site starts to bleed, raise your arm and put firm pressure on the site. If the bleeding does not stop, get help right away. This is a medical emergency.  If you went home on the same day as your procedure, a responsible adult should be with you for the first 24 hours after you arrive home.  Keep all follow-up visits as told by your health care provider. This is important. Contact a health care provider if:  You have a fever.  You have redness, swelling, or yellow drainage around your insertion site. Get help right away if:  You have unusual pain at the radial site.  The catheter insertion area swells very fast.  The insertion area is bleeding, and the bleeding does not stop when you hold steady pressure on the area.  Your arm or hand becomes pale, cool, tingly, or numb. These symptoms may represent a  serious problem that is an emergency. Do not wait to see if the symptoms will go away. Get medical help right away. Call your local emergency services (911 in the [place] Do not drive yourself to the hospital. Summary  After the procedure, it is common to have bruising and tenderness at the site.  Follow instructions from your health care provider about how to take care of your radial site wound. Check the wound every day for signs of infection.  Do not lift anything that is heavier than 10 lb (4.5 kg), or the limit that you are told, until your health care  provider says that it is safe. This information is not intended to replace advice given to you by your health care provider. Make sure you discuss any questions you have with your health care provider. Document Released: 03/22/2010 Document Revised: 03/25/2017 Document Reviewed: 03/25/2017 Elsevier Patient Education  2020 [employer].

## 2019-01-13 NOTE — Interval H&P Note (Signed)
History and Physical Interval Note:  01/13/2019 10:07 AM  Richard Martin  has presented today for surgery, with the diagnosis of heart failure.  The various methods of treatment have been discussed with the patient and family. After consideration of risks, benefits and other options for treatment, the patient has consented to  Procedure(s): LEFT HEART CATH AND CORONARY ANGIOGRAPHY (N/A) as a surgical intervention.  The patient's history has been reviewed, patient examined, no change in status, stable for surgery.  I have reviewed the patient's chart and labs.  Questions were answered to the patient's satisfaction.    Cath Lab Visit (complete for each Cath Lab visit)  Clinical Evaluation Leading to the Procedure:   ACS: No.  Non-ACS:    Anginal Classification: CCS II  Anti-ischemic medical therapy: No Therapy  Non-Invasive Test Results: No non-invasive testing performed  Prior CABG: No previous CABG       [name] [facility] 01/13/2019 10:07 AM

## 2019-01-14 ENCOUNTER — Ambulatory Visit (INDEPENDENT_AMBULATORY_CARE_PROVIDER_SITE_OTHER): Payer: Medicare Other

## 2019-01-14 ENCOUNTER — Telehealth: Payer: Self-pay | Admitting: Cardiology

## 2019-01-14 ENCOUNTER — Encounter (HOSPITAL_COMMUNITY): Payer: Self-pay | Admitting: Cardiology

## 2019-01-14 DIAGNOSIS — R55 Syncope and collapse: Secondary | ICD-10-CM | POA: Diagnosis not present

## 2019-01-14 NOTE — Telephone Encounter (Signed)
Spoke with patient.  Discussed cath results showed no significant coronary artery disease.  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  Recommend cardiac MRI for further evaluation.

## 2019-01-16 NOTE — Progress Notes (Signed)
Electrophysiology Office Note   Date:  01/17/2019   ID:  Benoit, Pociask 20-Feb-1941, MRN [id number]  PCP:  Celene Squibb, MD  Cardiologist: Gardiner Rhyme Primary Electrophysiologist:   [name], MD    Chief Complaint: bradycardia   History of Present Illness: Richard Martin is a 78 y.o. male who is being seen today for the evaluation of 2:1 AV block at the request of Celene Squibb, MD. Presenting today for electrophysiology evaluation.  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  He had a syncopal episode when he was walking in his kitchen.  He had sudden loss of consciousness.  He was only unconscious for a few seconds.  He checks his blood pressure which was elevated after the episode.  He had no symptoms prior to losing consciousness.  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  TTE showed an ejection fraction of 40 to 45% with an aneurysmal basal inferior segment.  Left heart catheterization showed no significant coronary artery disease.  Today, he denies symptoms of palpitations, chest pain, shortness of breath, orthopnea, PND, lower extremity edema, claudication, dizziness, presyncope, syncope, bleeding, or neurologic sequela. The patient is tolerating medications without difficulties.  He says that his episode of syncope occurred when he stood up.  He has not had any further episodes.  Since being started on lisinopril, he has felt much improved with less shortness of breath.   Past Medical History:  Diagnosis Date  . Diabetes mellitus without complication [facility])    Past Surgical History:  Procedure Laterality Date  . LEFT HEART CATH AND CORONARY ANGIOGRAPHY N/A 01/13/2019   Procedure: LEFT HEART CATH AND CORONARY ANGIOGRAPHY;  Surgeon: Martinique, Peter M, MD;  Location: [place] [facility];  Service: Cardiovascular;  Laterality: N/A;     Current Outpatient Medications  Medication Sig Dispense Refill  .  aspirin EC 81 MG tablet Take 81 mg by mouth daily.     . cholecalciferol (VITAMIN D3) 25 MCG (1000 UT) tablet Take 1,000 Units by mouth daily.    Marland Kitchen lisinopril (ZESTRIL) 5 MG tablet Take 1 tablet (5 mg total) by mouth daily. 90 tablet 3  . metFORMIN (GLUCOPHAGE) 500 MG tablet Take 1 tablet (500 mg total) by mouth 2 (two) times daily.    . rosuvastatin (CRESTOR) 10 MG tablet Take 10 mg by mouth daily.     Marland Kitchen zinc gluconate 50 MG tablet Take 50 mg by mouth daily.     Current Facility-Administered Medications  Medication Dose Route Frequency Provider Last Rate Last Dose  . sodium chloride flush (NS) 0.9 % injection 3 mL  3 mL Intravenous Q12H Donato Heinz, MD        Allergies:   Otho Darner allergy]   Social History:  The patient  reports that he has quit smoking. His smoking use included cigarettes. He has never used smokeless tobacco. He reports that he does not drink alcohol or use drugs.   Family History:  The patient's family history includes Heart attack in his father.    ROS:  Please see the history of present illness.   Otherwise, review of systems is positive for none.   All other systems are reviewed and negative.    PHYSICAL EXAM: VS:  BP 128/66   Pulse 71   Ht 5\' 10"  (1.778 m)   Wt 138 lb (62.6 kg)   SpO2 98%   BMI 19.80 kg/m  ,  BMI Body mass index is 19.8 kg/m. GEN: Well nourished, well developed, in no acute distress  HEENT: normal  Neck: no JVD, carotid bruits, or masses Cardiac: RRR; no murmurs, rubs, or gallops,no edema  Respiratory:  clear to auscultation bilaterally, normal work of breathing GI: soft, nontender, nondistended, + BS MS: no deformity or atrophy  Skin: warm and dry Neuro:  Strength and sensation are intact Psych: euthymic mood, full affect  EKG:  EKG is ordered today. Personal review of the ekg ordered shows sinus rhythm, right bundle branch block, left anterior fascicular block, rate 71  Recent Labs: 01/10/2019: BUN 18;  Creatinine, Ser 0.63; Hemoglobin 14.4; Platelets 254; Potassium 4.8; Sodium 138    Lipid Panel     Component Value Date/Time   CHOL 123 01/10/2019 1027   TRIG 66 01/10/2019 1027   HDL 52 01/10/2019 1027   CHOLHDL 2.4 01/10/2019 1027   LDLCALC 57 01/10/2019 1027     Wt Readings from Last 3 Encounters:  01/17/19 138 lb (62.6 kg)  01/13/19 137 lb (62.1 kg)  01/06/19 136 lb (61.7 kg)      Other studies Reviewed: Additional studies/ records that were reviewed today include: TTE 01/04/19  Review of the above records today demonstrates:   1. Left ventricular ejection fraction, by visual estimation, is 40 to 45%. The left ventricle has normal function. There is no left ventricular hypertrophy.  2. Basal inferior segment is abnormal.  3. Left ventricular diastolic parameters are consistent with Grade II diastolic dysfunction (pseudonormalization).  4. Global right ventricle has normal systolic function.The right ventricular size is mildly enlarged. No increase in right ventricular wall thickness.  5. Left atrial size was normal.  6. Right atrial size was mildly dilated.  7. The mitral valve is normal in structure. No evidence of mitral valve regurgitation. No evidence of mitral stenosis.  8. The tricuspid valve is normal in structure. Tricuspid valve regurgitation moderate.  9. The aortic valve is normal in structure. Aortic valve regurgitation is not visualized. No evidence of aortic valve sclerosis or stenosis. 10. The pulmonic valve was normal in structure. Pulmonic valve regurgitation is trivial. 11. Mildly elevated pulmonary artery systolic pressure. 12. The inferior vena cava is normal in size with greater than 50% respiratory variability, suggesting right atrial pressure of 3 mmHg. 13. Focal basal inferior aneursymal region/infarct. 14. There is redundancy of the interatrial septum.  LHC 01/13/19 1. Normal coronary anatomy 2. Moderate LV dysfunction 3. inferobasal aneurysm 4.  Normal LVEDP  ASSESSMENT AND PLAN:  1.  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  He does have systolic heart failure with an ejection fraction of 40 to 45% and thus would likely benefit from CRT.  Risks and benefits were discussed which include bleeding, tamponade, infection, pneumothorax.  He is currently wearing a monitor.  We  continue to assess his rhythm abnormalities via the monitor to determine if he needs a pacemaker.  2.  Systolic heart failure: New diagnosis.  Coronary arteries without abnormality.  Has class I symptoms.  Currently on lisinopril.  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    Current medicines are reviewed at length with the patient today.   The patient does not have concerns regarding his medicines.  The following changes were made today:  none  Labs/ tests ordered today include:  Orders Placed This Encounter  Procedures  . EKG 12-Lead   Case discussed with referring cardiologist  Disposition:  FU with   6 months  Signed,  [name], MD  01/17/2019 10:21 AM     [facility] [address] [place] [address] [phone] (office) [phone] (fax)

## 2019-01-17 ENCOUNTER — Other Ambulatory Visit: Payer: Self-pay

## 2019-01-17 ENCOUNTER — Encounter: Payer: Self-pay | Admitting: Cardiology

## 2019-01-17 ENCOUNTER — Ambulatory Visit (INDEPENDENT_AMBULATORY_CARE_PROVIDER_SITE_OTHER): Payer: Medicare Other | Admitting: Cardiology

## 2019-01-17 VITALS — BP 128/66 | HR 71 | Ht 70.0 in | Wt 138.0 lb

## 2019-01-17 DIAGNOSIS — I428 Other cardiomyopathies: Secondary | ICD-10-CM

## 2019-01-17 NOTE — Patient Instructions (Signed)
Medication Instructions:  Your physician recommends that you continue on your current medications as directed. Please refer to the Current Medication list given to you today.  * If you need a refill on your cardiac medications before your next appointment, please call your pharmacy.   Labwork: None ordered  Testing/Procedures: None ordered  Follow-Up: To be determined after monitor completed and reviewed.   Thank you for choosing [facility]!!   Trinidad Curet, RN [phone]  Any Other Special Instructions Will Be Listed Below (If Applicable).

## 2019-02-02 DIAGNOSIS — L570 Actinic keratosis: Secondary | ICD-10-CM | POA: Diagnosis not present

## 2019-02-02 DIAGNOSIS — D485 Neoplasm of uncertain behavior of skin: Secondary | ICD-10-CM | POA: Diagnosis not present

## 2019-02-02 DIAGNOSIS — Z85828 Personal history of other malignant neoplasm of skin: Secondary | ICD-10-CM | POA: Diagnosis not present

## 2019-02-02 DIAGNOSIS — L218 Other seborrheic dermatitis: Secondary | ICD-10-CM | POA: Diagnosis not present

## 2019-02-02 DIAGNOSIS — D0422 Carcinoma in situ of skin of left ear and external auricular canal: Secondary | ICD-10-CM | POA: Diagnosis not present

## 2019-02-02 DIAGNOSIS — C44519 Basal cell carcinoma of skin of other part of trunk: Secondary | ICD-10-CM | POA: Diagnosis not present

## 2019-02-04 ENCOUNTER — Encounter: Payer: Self-pay | Admitting: Cardiology

## 2019-02-04 ENCOUNTER — Telehealth: Payer: Self-pay | Admitting: *Deleted

## 2019-02-04 NOTE — Telephone Encounter (Signed)
Spoke with patient regarding Cardiac MRI appointment scheduled 03/02/19 at 11:00 am at [facility]---arrival time 10:15 am 1st floor admissions office----information will be mailed to patient and it is also in My Chart

## 2019-02-08 ENCOUNTER — Telehealth: Payer: Self-pay | Admitting: Cardiology

## 2019-02-08 DIAGNOSIS — E109 Type 1 diabetes mellitus without complications: Secondary | ICD-10-CM | POA: Diagnosis not present

## 2019-02-08 DIAGNOSIS — E119 Type 2 diabetes mellitus without complications: Secondary | ICD-10-CM | POA: Diagnosis not present

## 2019-02-08 DIAGNOSIS — I10 Essential (primary) hypertension: Secondary | ICD-10-CM | POA: Diagnosis not present

## 2019-02-08 DIAGNOSIS — E559 Vitamin D deficiency, unspecified: Secondary | ICD-10-CM | POA: Diagnosis not present

## 2019-02-08 DIAGNOSIS — E782 Mixed hyperlipidemia: Secondary | ICD-10-CM | POA: Diagnosis not present

## 2019-02-08 NOTE — Telephone Encounter (Signed)
[facility] Final report printed and provided to Dr. Gardiner Rhyme for review along with 10/30 and 11/5 progress notes and Dr. Curt Bears 11/16 progress notes

## 2019-02-08 NOTE — Telephone Encounter (Signed)
New Message  Clair Gulling from [facility] is calling in to report the findings of 3 episodes of second degree AV blocks and symptomatic bradycardia that were identified during the final report. States that results will be posted soon.

## 2019-02-09 NOTE — Telephone Encounter (Addendum)
I spoke with patient, he has not had any more syncopal episodes and reports that his dizziness is improved.  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  His MRI unfortunately is not scheduled until 12/30.  I will call to see if we can get that moved up to this week and can then f/u with Dr Curt Bears for device

## 2019-02-10 ENCOUNTER — Ambulatory Visit (HOSPITAL_COMMUNITY)
Admission: RE | Admit: 2019-02-10 | Discharge: 2019-02-10 | Disposition: A | Discharge status: Home / Self Care | Payer: Medicare Other | Source: Ambulatory Visit | Attending: Cardiology | Admitting: Cardiology

## 2019-02-10 ENCOUNTER — Other Ambulatory Visit: Payer: Self-pay

## 2019-02-10 DIAGNOSIS — R55 Syncope and collapse: Secondary | ICD-10-CM | POA: Diagnosis not present

## 2019-02-10 MED ORDER — GADOBUTROL 1 MMOL/ML IV SOLN
6.0000 mL | Freq: Once | INTRAVENOUS | Status: AC | PRN
  Administered 2019-02-10: 6 mL via INTRAVENOUS

## 2019-02-13 NOTE — Progress Notes (Signed)
Cardiology Office Note:    Date:  02/14/2019   ID:  [name], DOB March 23, 1940, MRN [id number]  PCP:  Celene Squibb, MD  Cardiologist:  No primary care provider on file.  Electrophysiologist:  None   Referring MD: Celene Squibb, MD   Chief Complaint  Patient presents with  . Congestive Heart Failure    History of Present Illness:    Richard Martin is a 78 y.o. male with a hx of diabetes, newly diagnosed systolic heart failure and second-degree Mobitz 2 AV block presents for follow-up.  He was initially seen on 12/31/2018 after being referred by Dr. Nevada Crane for evaluation of EKG abnormalities and syncopal episode.  He had a episode of syncope where he was walking to his kitchen and had sudden loss of consciousness.  Feels he was only unconscious for a few seconds and had no confusion afterwards.  Had been sitting in his chair prior to walk to the kitchen, states the position change happened about 30 seconds prior to syncopal episode.  He checked his blood pressure and it was elevated after the episode.  He did not report any symptoms prior to losing consciousness.  He was seen in clinic on 12/31/2018, and orthostatics were checked which were normal.  Rhythm strip demonstrated intermittent 2:1 AV block, with a wide QRS.  He was referred to EP for evaluation for pacemaker, and an echocardiogram was ordered.  TTE was done on 01/04/2019, which showed EF 40 to 45%, with aneurysmal basal inferior segment.  TTE was also notable for grade 2 diastolic dysfunction, normal RV function, no significant valvular disease.  Given new systolic dysfunction, cardiac catheterization was done on 01/13/2019, which showed normal coronary arteries.  Cardiac MRI was done on 02/11/2019, which showed EF 35%, basal inferior aneurysm, transmural basal inferior LGE, basal inferoseptal mid wall LGE.  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  Zio patch x14 days was completed, which showed frequent second-degree Mobitz 2 AV  block, with symptoms of dizziness corresponding to Mobitz 2 AV block.  Patient denies any further syncopal episodes.  States that dizziness has improved.  No lower extremity edema went for 1 mile walk this week, denies any exertional chest pain/dyspnea.  Reports BP has been 130-140s/70s.    Past Medical History:  Diagnosis Date  . Diabetes mellitus without complication [facility])     Past Surgical History:  Procedure Laterality Date  . LEFT HEART CATH AND CORONARY ANGIOGRAPHY N/A 01/13/2019   Procedure: LEFT HEART CATH AND CORONARY ANGIOGRAPHY;  Surgeon: Martinique, Peter M, MD;  Location: [place] [facility];  Service: Cardiovascular;  Laterality: N/A;    Current Medications: Current Meds  Medication Sig  . aspirin EC 81 MG tablet Take 81 mg by mouth daily.   . cholecalciferol (VITAMIN D3) 25 MCG (1000 UT) tablet Take 1,000 Units by mouth daily.  . metFORMIN (GLUCOPHAGE) 500 MG tablet Take 1 tablet (500 mg total) by mouth 2 (two) times daily.  . rosuvastatin (CRESTOR) 10 MG tablet Take 10 mg by mouth daily.   Marland Kitchen zinc gluconate 50 MG tablet Take 50 mg by mouth daily.  . [DISCONTINUED] lisinopril (ZESTRIL) 5 MG tablet Take 1 tablet (5 mg total) by mouth daily.   Current Facility-Administered Medications for the 02/14/19 encounter (Office Visit) with Donato Heinz, MD  Medication  . sodium chloride flush (NS) 0.9 % injection 3 mL     Allergies:   Crab [shellfish allergy]   Social History   Socioeconomic History  .  Marital status: Married    Spouse name: Not on file  . Number of children: Not on file  . Years of education: Not on file  . Highest education level: Not on file  Occupational History  . Not on file  Tobacco Use  . Smoking status: Former Smoker    Types: Cigarettes  . Smokeless tobacco: Never Used  Substance and Sexual Activity  . Alcohol use: No  . Drug use: No  . Sexual activity: Not on file  Other Topics Concern  . Not on file  Social History Narrative    . Not on file   Social Determinants of Health   Financial Resource Strain:   . Difficulty of Paying Living Expenses: Not on file  Food Insecurity:   . Worried About Charity fundraiser in the Last Year: Not on file  . Ran Out of Food in the Last Year: Not on file  Transportation Needs:   . Lack of Transportation (Medical): Not on file  . Lack of Transportation (Non-Medical): Not on file  Physical Activity:   . Days of Exercise per Week: Not on file  . Minutes of Exercise per Session: Not on file  Stress:   . Feeling of Stress : Not on file  Social Connections:   . Frequency of Communication with Friends and Family: Not on file  . Frequency of Social Gatherings with Friends and Family: Not on file  . Attends Religious Services: Not on file  . Active Member of Clubs or Organizations: Not on file  . Attends Archivist Meetings: Not on file  . Marital Status: Not on file     Family History: Father died of MI at age 7  ROS:   Please see the history of present illness.    All other systems reviewed and are negative.  EKGs/Labs/Other Studies Reviewed:    The following studies were reviewed today:   EKG:  EKG is not ordered today.  The ekg ordered 12/31/18 demonstrates normal sinus rhythm, rate 80 bpm, right bundle branch block, left anterior fascicular block.  Rhythm strip demonstrates periods of 2:1 AV block.  TTE 01/04/19:  1. Left ventricular ejection fraction, by visual estimation, is 40 to 45%. The left ventricle has normal function. There is no left ventricular hypertrophy.  2. Basal inferior segment is abnormal.  3. Left ventricular diastolic parameters are consistent with Grade II diastolic dysfunction (pseudonormalization).  4. Global right ventricle has normal systolic function.The right ventricular size is mildly enlarged. No increase in right ventricular wall thickness.  5. Left atrial size was normal.  6. Right atrial size was mildly dilated.  7. The  mitral valve is normal in structure. No evidence of mitral valve regurgitation. No evidence of mitral stenosis.  8. The tricuspid valve is normal in structure. Tricuspid valve regurgitation moderate.  9. The aortic valve is normal in structure. Aortic valve regurgitation is not visualized. No evidence of aortic valve sclerosis or stenosis. 10. The pulmonic valve was normal in structure. Pulmonic valve regurgitation is trivial. 11. Mildly elevated pulmonary artery systolic pressure. 12. The inferior vena cava is normal in size with greater than 50% respiratory variability, suggesting right atrial pressure of 3 mmHg. 13. Focal basal inferior aneursymal region/infarct. 14. There is redundancy of the interatrial septum.  LHC 01/13/19:  There is moderate left ventricular systolic dysfunction.  LV end diastolic pressure is normal.  The left ventricular ejection fraction is 35-45% by visual estimate.   1. Normal coronary  anatomy 2. Moderate LV dysfunction 3. inferobasal aneurysm 4. Normal LVEDP  CMR 02/11/19: 1. 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 While transmural LGE suggests ischemic etiology, it is not in a coronary distribution and patient had normal coronaries on recent cath. 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 2. Mild LV dilatation with moderate systolic dysfunction ([id number]). Basal inferior aneurysm 3.  Normal RV size and systolic function (EF [id number])  Cardiac monitor 02/13/19:  Frequent second degree Mobitz II AV block. Symptoms of dizziness corresponded to Mobitz II AV block   14 days of data recorded on Zio monitor. Patient had a min HR of 29 bpm, max HR of 123 bpm, and avg HR of 69 bpm. Predominant underlying rhythm was Sinus Rhythm.  No VT, SVT, atrial fibrillation, or pauses noted. Isolated atrial and ventricular ectopy  was rare (<1%).  8523 episodes of second degree Mobitz II AV block.  Patient triggered events, with symptoms reported of dizziness, corresponded to Mobitz II AV block.     Recent Labs: 01/10/2019: BUN 18; Creatinine, Ser 0.63; Hemoglobin 14.4; Platelets 254; Potassium 4.8; Sodium 138  Recent Lipid Panel    Component Value Date/Time   CHOL 123 01/10/2019 1027   TRIG 66 01/10/2019 1027   HDL 52 01/10/2019 1027   CHOLHDL 2.4 01/10/2019 1027   LDLCALC 57 01/10/2019 1027    Physical Exam:    VS:  BP 130/78   Pulse 77   Temp 97.6 F (36.4 C)   Ht 5\' 10"  (1.778 m)   Wt 138 lb 9.6 oz (62.9 kg)   BMI 19.89 kg/m     Wt Readings from Last 3 Encounters:  02/14/19 138 lb 9.6 oz (62.9 kg)  01/17/19 138 lb (62.6 kg)  01/13/19 137 lb (62.1 kg)     GEN:  Well nourished, well developed in no acute distress HEENT: Normal NECK: No JVD LYMPHATICS: No lymphadenopathy CARDIAC: RRR, no murmurs, rubs, gallops RESPIRATORY:  Clear to auscultation without rales, wheezing or rhonchi  ABDOMEN: Soft, non-tender, non-distended MUSCULOSKELETAL:  No edema SKIN: Warm and dry NEUROLOGIC:  Alert and oriented x 3 PSYCHIATRIC:  Normal affect   ASSESSMENT:    1. Ischemic cardiomyopathy   2. Acute systolic heart failure ([place])   3. Second degree Mobitz II AV block   4. Hyperlipidemia, unspecified hyperlipidemia type    PLAN:     Systolic heart failure: new diagnosis.  Normal coronary arteries on cath.  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  Class I symptoms.   - Likely not an entresto candidate given class I symptoms.  Will increase lisinopril to 10 mg daily.  Check BMET in 1 week - Not a candidate for beta-blocker given Mobitz 2 heart block as below - Given CMR concerning for sarcoid, will send for cardiac PET at [facility] to evaluate for evidence of active myocardial inflammation   Second-degree Mobitz 2 AV block: Presented with syncope.  Zio  patch confirmed frequent episodes of second-degree Mobitz 2 AV block with symptoms of dizziness corresponding to periods of Mobitz 2. -Seen by Dr. Curt Bears for consideration of pacemaker.  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  LDL 57 on 01/10/2019  Type 2 diabetes: Well-controlled, reports A1c 6.4%.  On  Metformin  RTC in 1 month   Medication Adjustments/Labs and Tests Ordered: Current medicines are reviewed at length with the patient today.  Concerns regarding medicines are outlined above.  Orders Placed This Encounter  Procedures  . Basic metabolic panel   Meds ordered this encounter  Medications  . lisinopril (ZESTRIL) 10 MG tablet    Sig: Take 1 tablet (10 mg total) by mouth daily.    Dispense:  90 tablet    Refill:  3    Patient Instructions  Medication Instructions:  Increase Lisinopril to 10 mg daily   Lab Work: Bmet in 1 week Lab order enclosed   Testing/Procedures: Cardiac Pet Scan to be done at [facility]   We will call back with appointment   Follow-Up: At [facility], you and your health needs are our priority.  As part of our continuing mission to provide you with exceptional heart care, we have created designated Provider Care Teams.  These Care Teams include your primary Cardiologist (physician) and Advanced Practice Providers (APPs -  Physician Assistants and Nurse Practitioners) who all work together to provide you with the care you need, when you need it.  Your next appointment:  1 month  Thursday 03/17/19 at 9:40 am  The format for your next appointment:  Office   Provider: Dr.Blu Mcglaun       Signed, Donato Heinz, MD  02/14/2019 9:13 PM    [place]

## 2019-02-14 ENCOUNTER — Ambulatory Visit (INDEPENDENT_AMBULATORY_CARE_PROVIDER_SITE_OTHER): Payer: Medicare Other | Admitting: Cardiology

## 2019-02-14 ENCOUNTER — Other Ambulatory Visit: Payer: Self-pay

## 2019-02-14 VITALS — BP 130/78 | HR 77 | Temp 97.6°F | Ht 70.0 in | Wt 138.6 lb

## 2019-02-14 DIAGNOSIS — E785 Hyperlipidemia, unspecified: Secondary | ICD-10-CM | POA: Diagnosis not present

## 2019-02-14 DIAGNOSIS — I441 Atrioventricular block, second degree: Secondary | ICD-10-CM | POA: Diagnosis not present

## 2019-02-14 DIAGNOSIS — I429 Cardiomyopathy, unspecified: Secondary | ICD-10-CM

## 2019-02-14 DIAGNOSIS — I5021 Acute systolic (congestive) heart failure: Secondary | ICD-10-CM | POA: Diagnosis not present

## 2019-02-14 MED ORDER — LISINOPRIL 10 MG PO TABS: 10.0000 mg | ORAL_TABLET | Freq: Every day | ORAL | 3 refills | Status: DC

## 2019-02-14 NOTE — Patient Instructions (Addendum)
Medication Instructions:  Increase Lisinopril to 10 mg daily   Lab Work: Bmet in 1 week Lab order enclosed   Testing/Procedures: Cardiac Pet Scan to be done at [facility]   We will call back with appointment   Follow-Up: At [facility], you and your health needs are our priority.  As part of our continuing mission to provide you with exceptional heart care, we have created designated Provider Care Teams.  These Care Teams include your primary Cardiologist (physician) and Advanced Practice Providers (APPs -  Physician Assistants and Nurse Practitioners) who all work together to provide you with the care you need, when you need it.  Your next appointment:  1 month  Thursday 03/17/19 at 9:40 am  The format for your next appointment:  Office   Provider: Dr.Schumann

## 2019-02-15 DIAGNOSIS — I429 Cardiomyopathy, unspecified: Secondary | ICD-10-CM | POA: Diagnosis not present

## 2019-02-15 DIAGNOSIS — E559 Vitamin D deficiency, unspecified: Secondary | ICD-10-CM | POA: Diagnosis not present

## 2019-02-15 DIAGNOSIS — Z0001 Encounter for general adult medical examination with abnormal findings: Secondary | ICD-10-CM | POA: Diagnosis not present

## 2019-02-15 DIAGNOSIS — E782 Mixed hyperlipidemia: Secondary | ICD-10-CM | POA: Diagnosis not present

## 2019-02-15 DIAGNOSIS — I253 Aneurysm of heart: Secondary | ICD-10-CM | POA: Diagnosis not present

## 2019-02-15 DIAGNOSIS — I5021 Acute systolic (congestive) heart failure: Secondary | ICD-10-CM | POA: Diagnosis not present

## 2019-02-15 DIAGNOSIS — I441 Atrioventricular block, second degree: Secondary | ICD-10-CM | POA: Diagnosis not present

## 2019-02-15 DIAGNOSIS — E119 Type 2 diabetes mellitus without complications: Secondary | ICD-10-CM | POA: Diagnosis not present

## 2019-02-22 LAB — BASIC METABOLIC PANEL
BUN/Creatinine Ratio: 22 (ref 10–24)
BUN: 15 mg/dL (ref 8–27)
CO2: 25 mmol/L (ref 20–29)
Calcium: 9.8 mg/dL (ref 8.6–10.2)
Chloride: 99 mmol/L (ref 96–106)
Creatinine, Ser: 0.68 mg/dL — ABNORMAL LOW (ref 0.76–1.27)
GFR calc Af Amer: 106 mL/min/{1.73_m2} (ref >59)
GFR calc non Af Amer: 92 mL/min/{1.73_m2} (ref >59)
Glucose: 139 mg/dL — ABNORMAL HIGH (ref 65–99)
Potassium: 5.1 mmol/L (ref 3.5–5.2)
Sodium: 138 mmol/L (ref 134–144)

## 2019-03-02 ENCOUNTER — Ambulatory Visit (HOSPITAL_COMMUNITY): Admission: RE | Admit: 2019-03-02 | Payer: Medicare Other | Source: Ambulatory Visit

## 2019-03-14 ENCOUNTER — Ambulatory Visit (INDEPENDENT_AMBULATORY_CARE_PROVIDER_SITE_OTHER): Payer: Medicare Other | Admitting: Cardiology

## 2019-03-14 ENCOUNTER — Encounter: Payer: Self-pay | Admitting: Cardiology

## 2019-03-14 ENCOUNTER — Other Ambulatory Visit: Payer: Self-pay

## 2019-03-14 VITALS — BP 148/72 | HR 72 | Ht 70.0 in | Wt 138.6 lb

## 2019-03-14 DIAGNOSIS — I428 Other cardiomyopathies: Secondary | ICD-10-CM

## 2019-03-14 NOTE — Progress Notes (Signed)
Electrophysiology Office Note   Date:  03/14/2019   ID:  Richard Martin, Richard Martin 1940/04/02, MRN [id number]  PCP:  Celene Squibb, MD  Cardiologist: Gardiner Rhyme Primary Electrophysiologist:  Markeisha Mancias [name], MD    Chief Complaint: bradycardia   History of Present Illness: Richard Martin is a 79 y.o. male who is being seen today for the evaluation of 2:1 AV block at the request of Celene Squibb, MD. Presenting today for electrophysiology evaluation.  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  He had a syncopal episode when he was walking in his kitchen.  He had sudden loss of consciousness.  He was only unconscious for a few seconds.  He checks his blood pressure which was elevated after the episode.  He had no symptoms prior to losing consciousness.  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  TTE showed an ejection fraction of 40 to 45% with an aneurysmal basal inferior segment.  Left heart catheterization showed no significant coronary artery disease.  Today, denies symptoms of palpitations, chest pain, shortness of breath, orthopnea, PND, lower extremity edema, claudication, dizziness, presyncope, syncope, bleeding, or neurologic sequela. The patient is tolerating medications without difficulties.  Overall he is doing well.  He has no chest pain or shortness of breath.  He has slowed down a little bit over the last few months but has no major acute complaints today.   Past Medical History:  Diagnosis Date  . Diabetes mellitus without complication [facility])    Past Surgical History:  Procedure Laterality Date  . LEFT HEART CATH AND CORONARY ANGIOGRAPHY N/A 01/13/2019   Procedure: LEFT HEART CATH AND CORONARY ANGIOGRAPHY;  Surgeon: Martinique, Peter M, MD;  Location: [place] [facility];  Service: Cardiovascular;  Laterality: N/A;     Current Outpatient Medications  Medication Sig Dispense Refill  . aspirin EC 81 MG tablet  Take 81 mg by mouth daily.     . cholecalciferol (VITAMIN D3) 25 MCG (1000 UT) tablet Take 1,000 Units by mouth daily.    Marland Kitchen lisinopril (ZESTRIL) 10 MG tablet Take 1 tablet (10 mg total) by mouth daily. 90 tablet 3  . metFORMIN (GLUCOPHAGE) 500 MG tablet Take 1 tablet (500 mg total) by mouth 2 (two) times daily.    . rosuvastatin (CRESTOR) 10 MG tablet Take 10 mg by mouth daily.     Marland Kitchen zinc gluconate 50 MG tablet Take 50 mg by mouth daily.     Current Facility-Administered Medications  Medication Dose Route Frequency Provider Last Rate Last Admin  . sodium chloride flush (NS) 0.9 % injection 3 mL  3 mL Intravenous Q12H Donato Heinz, MD        Allergies:   Otho Darner allergy]   Social History:  The patient  reports that he has quit smoking. His smoking use included cigarettes. He has never used smokeless tobacco. He reports that he does not drink alcohol or use drugs.   Family History:  The patient's family history includes Aneurysm in his mother; Heart attack in his father.   ROS:  Please see the history of present illness.   Otherwise, review of systems is positive for none.   All other systems are reviewed and negative.   PHYSICAL EXAM: VS:  BP (!) 148/72   Pulse 72   Ht 5\' 10"  (1.778 m)   Wt 138 lb 9.6 oz (62.9 kg)   BMI 19.89 kg/m  ,  BMI Body mass index is 19.89 kg/m. GEN: Well nourished, well developed, in no acute distress  HEENT: normal  Neck: no JVD, carotid bruits, or masses Cardiac: RRR; no murmurs, rubs, or gallops,no edema  Respiratory:  clear to auscultation bilaterally, normal work of breathing GI: soft, nontender, nondistended, + BS MS: no deformity or atrophy  Skin: warm and dry Neuro:  Strength and sensation are intact Psych: euthymic mood, full affect  EKG:  EKG is ordered today. Personal review of the ekg ordered shows sinus rhythm, left bundle branch block, rate 72  Recent Labs: 01/10/2019: Hemoglobin 14.4; Platelets 254 02/21/2019: BUN 15;  Creatinine, Ser 0.68; Potassium 5.1; Sodium 138    Lipid Panel     Component Value Date/Time   CHOL 123 01/10/2019 1027   TRIG 66 01/10/2019 1027   HDL 52 01/10/2019 1027   CHOLHDL 2.4 01/10/2019 1027   LDLCALC 57 01/10/2019 1027     Wt Readings from Last 3 Encounters:  03/14/19 138 lb 9.6 oz (62.9 kg)  02/14/19 138 lb 9.6 oz (62.9 kg)  01/17/19 138 lb (62.6 kg)      Other studies Reviewed: Additional studies/ records that were reviewed today include: CMRI 02/10/19  Review of the above records today demonstrates:  1. 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 While transmural LGE suggests ischemic etiology, it is not in a coronary distribution and patient had normal coronaries on recent cath. 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  2. Mild LV dilatation with moderate systolic dysfunction (EF [id number]). Basal inferior aneurysm  3.  Normal RV size and systolic function (EF [id number]) LHC 01/13/19 1. Normal coronary anatomy 2. Moderate LV dysfunction 3. inferobasal aneurysm 4. Normal LVEDP  ASSESSMENT AND PLAN:  1.  Intermittent 2-1 AV block as well as syncope: We Ashten Prats plan for CRT-D.  He does have a low ejection fraction and thus would benefit.  He also has left bundle branch block.  Risks and benefits were discussed include bleeding, tamponade, infection, pneumothorax.  He understand these risks and is agreed to the procedure.  2.  Systolic heart failure: Currently on lisinopril.  Holding beta-blocker due to 2-1 AV block.  We Kestrel Mis plan for CRT-D as above.   Current medicines are reviewed at length with the patient today.   The patient does not have concerns regarding his medicines.  The following changes were made today: None  Labs/ tests ordered today include:  No orders of the defined types were placed in this  encounter.   Disposition:   FU with Naria Abbey 3 months  Signed, Darivs Lunden [name], MD  03/14/2019 3:44 PM     [place] [address] [place] [address] [phone] (office) [phone] (fax)

## 2019-03-17 ENCOUNTER — Encounter: Payer: Self-pay | Admitting: Cardiology

## 2019-03-17 ENCOUNTER — Ambulatory Visit (INDEPENDENT_AMBULATORY_CARE_PROVIDER_SITE_OTHER): Payer: Medicare Other | Admitting: Cardiology

## 2019-03-17 ENCOUNTER — Other Ambulatory Visit: Payer: Self-pay

## 2019-03-17 VITALS — BP 138/72 | HR 67 | Temp 97.0°F | Ht 70.0 in | Wt 140.0 lb

## 2019-03-17 DIAGNOSIS — I441 Atrioventricular block, second degree: Secondary | ICD-10-CM | POA: Diagnosis not present

## 2019-03-17 DIAGNOSIS — E785 Hyperlipidemia, unspecified: Secondary | ICD-10-CM

## 2019-03-17 DIAGNOSIS — I5021 Acute systolic (congestive) heart failure: Secondary | ICD-10-CM | POA: Diagnosis not present

## 2019-03-17 DIAGNOSIS — R55 Syncope and collapse: Secondary | ICD-10-CM | POA: Diagnosis not present

## 2019-03-17 DIAGNOSIS — I428 Other cardiomyopathies: Secondary | ICD-10-CM

## 2019-03-17 NOTE — Patient Instructions (Addendum)
Medication Instructions:  Continue same medications *If you need a refill on your cardiac medications before your next appointment, please call your pharmacy*  Lab Work: None ordered   Testing/Procedures: None ordered  Follow-Up: At [facility], you and your health needs are our priority.  As part of our continuing mission to provide you with exceptional heart care, we have created designated Provider Care Teams.  These Care Teams include your primary Cardiologist (physician) and Advanced Practice Providers (APPs -  Physician Assistants and Nurse Practitioners) who all work together to provide you with the care you need, when you need it.  Your next appointment:  1 month  The format for your next appointment: Office     Provider:  Dr.Schumann

## 2019-03-17 NOTE — Progress Notes (Signed)
Cardiology Office Note:    Date:  03/17/2019   ID:  [name], DOB 1940/08/29, MRN [id number]  PCP:  Celene Squibb, MD  Cardiologist:  No primary care provider on file.  Electrophysiologist:  None   Referring MD: Celene Squibb, MD   Chief Complaint  Patient presents with  . Follow-up    History of Present Illness:    Richard Martin is a 79 y.o. male with a hx of diabetes, newly diagnosed systolic heart failure and second-degree Mobitz 2 AV block presents for follow-up.  He was initially seen on 12/31/2018 after being referred by Dr. Nevada Crane for evaluation of EKG abnormalities and syncopal episode.  He had a episode of syncope where he was walking to his kitchen and had sudden loss of consciousness.  Feels he was only unconscious for a few seconds and had no confusion afterwards.  Had been sitting in his chair prior to walk to the kitchen, states the position change happened about 30 seconds prior to syncopal episode.  He checked his blood pressure and it was elevated after the episode.  He did not report any symptoms prior to losing consciousness.  He was seen in clinic on 12/31/2018, and orthostatics were checked which were normal.  Rhythm strip demonstrated intermittent 2:1 AV block, with a wide QRS.  He was referred to EP for evaluation for pacemaker, and an echocardiogram was ordered.  TTE was done on 01/04/2019, which showed EF 40 to 45%, with aneurysmal basal inferior segment.  TTE was also notable for grade 2 diastolic dysfunction, normal RV function, no significant valvular disease.  Given new systolic dysfunction, cardiac catheterization was done on 01/13/2019, which showed normal coronary arteries.  Cardiac MRI was done on 02/11/2019, which showed EF 35%, basal inferior aneurysm, transmural basal inferior LGE, basal inferoseptal mid wall LGE.  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  Zio patch x14 days was completed, which showed frequent second-degree Mobitz 2 AV block, with  symptoms of dizziness corresponding to Mobitz 2 AV block.  Since last clinic visit he reports that he is feeling more fatigued.  No further syncopal episodes.  Occasional chest tightness on left side.  Went for 0.5 mile walk last week, felt more tired than usual.  Reports BP has been 130/70s.     Past Medical History:  Diagnosis Date  . Diabetes mellitus without complication [facility])     Past Surgical History:  Procedure Laterality Date  . LEFT HEART CATH AND CORONARY ANGIOGRAPHY N/A 01/13/2019   Procedure: LEFT HEART CATH AND CORONARY ANGIOGRAPHY;  Surgeon: Martinique, Peter M, MD;  Location: [place] [facility];  Service: Cardiovascular;  Laterality: N/A;    Current Medications: Current Meds  Medication Sig  . aspirin EC 81 MG tablet Take 81 mg by mouth daily.   . cholecalciferol (VITAMIN D3) 25 MCG (1000 UT) tablet Take 1,000 Units by mouth daily.  Marland Kitchen lisinopril (ZESTRIL) 10 MG tablet Take 1 tablet (10 mg total) by mouth daily.  . metFORMIN (GLUCOPHAGE) 500 MG tablet Take 1 tablet (500 mg total) by mouth 2 (two) times daily.  . rosuvastatin (CRESTOR) 10 MG tablet Take 10 mg by mouth daily.   Marland Kitchen zinc gluconate 50 MG tablet Take 50 mg by mouth daily.   Current Facility-Administered Medications for the 03/17/19 encounter (Office Visit) with Donato Heinz, MD  Medication  . sodium chloride flush (NS) 0.9 % injection 3 mL     Allergies:   Crab [shellfish allergy]  Social History   Socioeconomic History  . Marital status: Married    Spouse name: Not on file  . Number of children: Not on file  . Years of education: Not on file  . Highest education level: Not on file  Occupational History  . Not on file  Tobacco Use  . Smoking status: Former Smoker    Types: Cigarettes  . Smokeless tobacco: Never Used  Substance and Sexual Activity  . Alcohol use: No  . Drug use: No  . Sexual activity: Not on file  Other Topics Concern  . Not on file  Social History Narrative  . Not  on file   Social Determinants of Health   Financial Resource Strain:   . Difficulty of Paying Living Expenses: Not on file  Food Insecurity:   . Worried About Charity fundraiser in the Last Year: Not on file  . Ran Out of Food in the Last Year: Not on file  Transportation Needs:   . Lack of Transportation (Medical): Not on file  . Lack of Transportation (Non-Medical): Not on file  Physical Activity:   . Days of Exercise per Week: Not on file  . Minutes of Exercise per Session: Not on file  Stress:   . Feeling of Stress : Not on file  Social Connections:   . Frequency of Communication with Friends and Family: Not on file  . Frequency of Social Gatherings with Friends and Family: Not on file  . Attends Religious Services: Not on file  . Active Member of Clubs or Organizations: Not on file  . Attends Archivist Meetings: Not on file  . Marital Status: Not on file     Family History: Father died of MI at age 63  ROS:   Please see the history of present illness.    All other systems reviewed and are negative.  EKGs/Labs/Other Studies Reviewed:    The following studies were reviewed today:   EKG:  EKG is not ordered today.  The ekg ordered 12/31/18 demonstrates normal sinus rhythm, rate 80 bpm, right bundle branch block, left anterior fascicular block.  Rhythm strip demonstrates periods of 2:1 AV block.  TTE 01/04/19:  1. Left ventricular ejection fraction, by visual estimation, is 40 to 45%. The left ventricle has normal function. There is no left ventricular hypertrophy.  2. Basal inferior segment is abnormal.  3. Left ventricular diastolic parameters are consistent with Grade II diastolic dysfunction (pseudonormalization).  4. Global right ventricle has normal systolic function.The right ventricular size is mildly enlarged. No increase in right ventricular wall thickness.  5. Left atrial size was normal.  6. Right atrial size was mildly dilated.  7. The mitral  valve is normal in structure. No evidence of mitral valve regurgitation. No evidence of mitral stenosis.  8. The tricuspid valve is normal in structure. Tricuspid valve regurgitation moderate.  9. The aortic valve is normal in structure. Aortic valve regurgitation is not visualized. No evidence of aortic valve sclerosis or stenosis. 10. The pulmonic valve was normal in structure. Pulmonic valve regurgitation is trivial. 11. Mildly elevated pulmonary artery systolic pressure. 12. The inferior vena cava is normal in size with greater than 50% respiratory variability, suggesting right atrial pressure of 3 mmHg. 13. Focal basal inferior aneursymal region/infarct. 14. There is redundancy of the interatrial septum.  LHC 01/13/19:  There is moderate left ventricular systolic dysfunction.  LV end diastolic pressure is normal.  The left ventricular ejection fraction is 35-45% by  visual estimate.   1. Normal coronary anatomy 2. Moderate LV dysfunction 3. inferobasal aneurysm 4. Normal LVEDP  CMR 02/11/19: 1. 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 While transmural LGE suggests ischemic etiology, it is not in a coronary distribution and patient had normal coronaries on recent cath. 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 2. Mild LV dilatation with moderate systolic dysfunction (EF [id number]). Basal inferior aneurysm 3.  Normal RV size and systolic function (EF [id number])  Cardiac monitor 02/13/19:  Frequent second degree Mobitz II AV block. Symptoms of dizziness corresponded to Mobitz II AV block   14 days of data recorded on Zio monitor. Patient had a min HR of 29 bpm, max HR of 123 bpm, and avg HR of 69 bpm. Predominant underlying rhythm was Sinus Rhythm.  No VT, SVT, atrial fibrillation, or pauses noted. Isolated atrial and ventricular ectopy was rare  (<1%).  8523 episodes of second degree Mobitz II AV block.  Patient triggered events, with symptoms reported of dizziness, corresponded to Mobitz II AV block.     Recent Labs: 01/10/2019: Hemoglobin 14.4; Platelets 254 02/21/2019: BUN 15; Creatinine, Ser 0.68; Potassium 5.1; Sodium 138  Recent Lipid Panel    Component Value Date/Time   CHOL 123 01/10/2019 1027   TRIG 66 01/10/2019 1027   HDL 52 01/10/2019 1027   CHOLHDL 2.4 01/10/2019 1027   LDLCALC 57 01/10/2019 1027    Physical Exam:    VS:  BP 138/72   Pulse 67   Temp (!) 97 F (36.1 C)   Ht 5\' 10"  (1.778 m)   Wt 140 lb (63.5 kg)   SpO2 99%   BMI 20.09 kg/m     Wt Readings from Last 3 Encounters:  03/17/19 140 lb (63.5 kg)  03/14/19 138 lb 9.6 oz (62.9 kg)  02/14/19 138 lb 9.6 oz (62.9 kg)     GEN:  Well nourished, well developed in no acute distress HEENT: Normal NECK: No JVD LYMPHATICS: No lymphadenopathy CARDIAC: RRR, no murmurs, rubs, gallops RESPIRATORY:  Clear to auscultation without rales, wheezing or rhonchi  ABDOMEN: Soft, non-tender, non-distended MUSCULOSKELETAL:  No edema SKIN: Warm and dry NEUROLOGIC:  Alert and oriented x 3 PSYCHIATRIC:  Normal affect   ASSESSMENT:    1. Acute systolic heart failure ([place])   2. Second degree Mobitz II AV block   3. Hyperlipidemia, unspecified hyperlipidemia type   4. Syncope and collapse   5. Nonischemic cardiomyopathy (HCC)    PLAN:     Systolic heart failure: new diagnosis.  Normal coronary arteries on cath.  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   - Continue lisinopril 10 mg daily - Not a candidate for beta-blocker given Mobitz 2 heart block as below - Given CMR concerning for sarcoid, will send for cardiac PET at [facility] to evaluate for evidence of active myocardial inflammation.  PET scheduled for 03/31/19   Second-degree Mobitz 2 AV block: Presented with syncope.  Zio patch confirmed frequent  episodes of second-degree Mobitz 2 AV block with symptoms of dizziness corresponding to periods of Mobitz 2. -Seen by Dr. Curt Bears, given EF 35% and LBBB, planning for CRT-D  Hyperlipidemia: On rosuvastatin 10 mg daily.  LDL 57 on 01/10/2019  Type 2 diabetes: Well-controlled, reports A1c 6.4%.  On Metformin  RTC in 1 month following PET at [facility]   Medication Adjustments/Labs  and Tests Ordered: Current medicines are reviewed at length with the patient today.  Concerns regarding medicines are outlined above.  No orders of the defined types were placed in this encounter.  No orders of the defined types were placed in this encounter.   Patient Instructions  Medication Instructions:  Continue same medications *If you need a refill on your cardiac medications before your next appointment, please call your pharmacy*  Lab Work: None ordered   Testing/Procedures: None ordered  Follow-Up: At [facility], you and your health needs are our priority.  As part of our continuing mission to provide you with exceptional heart care, we have created designated Provider Care Teams.  These Care Teams include your primary Cardiologist (physician) and Advanced Practice Providers (APPs -  Physician Assistants and Nurse Practitioners) who all work together to provide you with the care you need, when you need it.  Your next appointment:  1 month  The format for your next appointment: Office     Provider:  Dr.Shumann        Signed, Donato Heinz, MD  03/17/2019 10:20 AM    [place]

## 2019-03-18 ENCOUNTER — Telehealth: Payer: Self-pay

## 2019-03-18 NOTE — Telephone Encounter (Signed)
Called [facility] PET facility to confirm address for pt. Was informed a scheduler will contact pt 1-2 day prior to test to go over instructions and provide address.  Pt updated and verbalized understanding.

## 2019-03-29 ENCOUNTER — Telehealth: Payer: Self-pay | Admitting: Cardiology

## 2019-03-29 NOTE — Telephone Encounter (Signed)
Received call from patient stating his HR has been consistently running in the 20s-30s for 2 days, occasionally increasing into the 60s with exertion.   He states he noticed this starting on Sunday.  Usually HR is in the 60s per patient report.   He has 1 blood pressure cuff that is reading 140/80 HR 28 and his pulse ox is also reading HR 28.   Patient took pulse manually while on phone with nurse, HR 30.    Patient denies dizziness, SOB, CP, lightheadedness.   Patient asymptomatic.    Per chart review hx of Mobitz II AV block,  Monitor 01/14/19- HR min 45m with frequent AV block.  Sees Dr. Curt Bears for bradycardia, AV block and CM.   Last OV 03/14/19:  1.  Intermittent 2-1 AV block as well as syncope: We will plan for CRT-D.  He does have a low ejection fraction and thus would benefit.  He also has left bundle branch block.  Risks and benefits were discussed include bleeding, tamponade, infection, pneumothorax.  He understand these risks and is agreed to the procedure.   Advised as long as he is asymptomatic, continue to monitor and would route to MD for review/recommendations.   Advised if he becomes symptomatic to proceed to ER for evaluation.  Patient verbalized understanding.

## 2019-03-29 NOTE — Telephone Encounter (Signed)
Hayley -- I just want to clarify..... did the pt say they wanted to proceed?  At that [place] w/ Camnitz my instructions, per [facility], were to call pt in one month to see if he wants to proceed w/ implant.  Due to report below, does pt want to go ahead and proceed or is this only an FYI from the patient?

## 2019-03-29 NOTE — Telephone Encounter (Signed)
STAT if HR is under 50 or over 120 (normal HR is 60-100 beats per minute)  1) What is your heart rate? 29-30 now, 62 earlier  2) Do you have a log of your heart rate readings (document readings)? Yes.   3) Do you have any other symptoms? Mild weakness

## 2019-03-29 NOTE — Telephone Encounter (Signed)
Spoke to patient-he is willing to proceed with "whatever is needed".   He still continues to have a lot of questions in regard to this procedure but is willing to have it done if this is what is recommended.      Will make nurse aware.

## 2019-03-30 ENCOUNTER — Ambulatory Visit: Payer: Medicare Other

## 2019-03-31 DIAGNOSIS — R55 Syncope and collapse: Secondary | ICD-10-CM | POA: Diagnosis not present

## 2019-03-31 DIAGNOSIS — I441 Atrioventricular block, second degree: Secondary | ICD-10-CM | POA: Diagnosis not present

## 2019-03-31 DIAGNOSIS — R911 Solitary pulmonary nodule: Secondary | ICD-10-CM | POA: Diagnosis not present

## 2019-03-31 DIAGNOSIS — I429 Cardiomyopathy, unspecified: Secondary | ICD-10-CM | POA: Diagnosis not present

## 2019-03-31 DIAGNOSIS — D8685 Sarcoid myocarditis: Secondary | ICD-10-CM | POA: Diagnosis not present

## 2019-04-04 NOTE — Telephone Encounter (Signed)
Followed up w/ pt, apologized for the delayed return call. Questions answered. Pt appreciates my return call.  He understands I will follow up soon to discuss if CRT implant needed. Patient verbalized understanding and agreeable to plan.

## 2019-04-08 ENCOUNTER — Ambulatory Visit: Payer: Medicare Other

## 2019-04-09 ENCOUNTER — Ambulatory Visit: Payer: Medicare Other | Attending: Internal Medicine

## 2019-04-09 DIAGNOSIS — Z23 Encounter for immunization: Secondary | ICD-10-CM

## 2019-04-09 NOTE — Progress Notes (Signed)
   Covid-19 Vaccination Clinic  Name:  Richard Martin    MRN: [id number] DOB: 04-Jun-1940  04/09/2019  Richard Martin was observed post Covid-19 immunization for 15 minutes without incidence. He was provided with Vaccine Information Sheet and instruction to access the V-Safe system.   Richard Martin was instructed to call 911 with any severe reactions post vaccine: Marland Kitchen Difficulty breathing  . Swelling of your face and throat  . A fast heartbeat  . A bad rash all over your body  . Dizziness and weakness    Immunizations Administered    Name Date Dose VIS Date Route   Pfizer COVID-19 Vaccine 04/09/2019  9:03 AM 0.3 mL 02/11/2019 Intramuscular   Manufacturer: [place]   Lot: [id number]   [place]: [id number]

## 2019-04-15 ENCOUNTER — Telehealth: Payer: Self-pay | Admitting: *Deleted

## 2019-04-15 NOTE — Telephone Encounter (Signed)
Followed up with pt about scheduling  CRT implant. Pt would like to discuss w/ wife over weekend to decide a date. Dates for March 22 or 29 given. Will follow up next week to arrange a procedure date

## 2019-04-21 NOTE — Telephone Encounter (Signed)
Pt reports he is agreeable to 3/22. Aware I will follow up in next several weeks to review instructions. Pt agreeable to plan.

## 2019-04-23 DIAGNOSIS — I4430 Unspecified atrioventricular block: Secondary | ICD-10-CM | POA: Diagnosis not present

## 2019-04-23 DIAGNOSIS — I4510 Unspecified right bundle-branch block: Secondary | ICD-10-CM | POA: Diagnosis not present

## 2019-04-23 DIAGNOSIS — R0789 Other chest pain: Secondary | ICD-10-CM | POA: Diagnosis not present

## 2019-04-23 DIAGNOSIS — I959 Hypotension, unspecified: Secondary | ICD-10-CM | POA: Diagnosis not present

## 2019-04-23 DIAGNOSIS — R079 Chest pain, unspecified: Secondary | ICD-10-CM | POA: Diagnosis not present

## 2019-04-24 ENCOUNTER — Emergency Department (HOSPITAL_COMMUNITY)
Admission: EM | Admit: 2019-04-24 | Discharge: 2019-04-24 | Disposition: A | Discharge status: Home / Self Care | Payer: Medicare Other | Attending: Emergency Medicine | Admitting: Emergency Medicine

## 2019-04-24 ENCOUNTER — Emergency Department (HOSPITAL_COMMUNITY): Payer: Medicare Other

## 2019-04-24 DIAGNOSIS — Z87891 Personal history of nicotine dependence: Secondary | ICD-10-CM | POA: Diagnosis not present

## 2019-04-24 DIAGNOSIS — J439 Emphysema, unspecified: Secondary | ICD-10-CM | POA: Diagnosis not present

## 2019-04-24 DIAGNOSIS — Z79899 Other long term (current) drug therapy: Secondary | ICD-10-CM | POA: Insufficient documentation

## 2019-04-24 DIAGNOSIS — Z7982 Long term (current) use of aspirin: Secondary | ICD-10-CM | POA: Diagnosis not present

## 2019-04-24 DIAGNOSIS — E119 Type 2 diabetes mellitus without complications: Secondary | ICD-10-CM | POA: Insufficient documentation

## 2019-04-24 DIAGNOSIS — Z7984 Long term (current) use of oral hypoglycemic drugs: Secondary | ICD-10-CM | POA: Insufficient documentation

## 2019-04-24 DIAGNOSIS — R001 Bradycardia, unspecified: Secondary | ICD-10-CM | POA: Diagnosis not present

## 2019-04-24 DIAGNOSIS — I441 Atrioventricular block, second degree: Secondary | ICD-10-CM | POA: Diagnosis not present

## 2019-04-24 DIAGNOSIS — R55 Syncope and collapse: Secondary | ICD-10-CM | POA: Diagnosis present

## 2019-04-24 LAB — TROPONIN I (HIGH SENSITIVITY): Troponin I (High Sensitivity): 6 ng/L (ref <18)

## 2019-04-24 LAB — BASIC METABOLIC PANEL
Anion gap: 13 (ref 5–15)
BUN: 16 mg/dL (ref 8–23)
CO2: 26 mmol/L (ref 22–32)
Calcium: 9.6 mg/dL (ref 8.9–10.3)
Chloride: 100 mmol/L (ref 98–111)
Creatinine, Ser: 0.71 mg/dL (ref 0.61–1.24)
GFR calc Af Amer: >60 mL/min (ref >60)
GFR calc non Af Amer: >60 mL/min (ref >60)
Glucose, Bld: 214 mg/dL — ABNORMAL HIGH (ref 70–99)
Potassium: 4 mmol/L (ref 3.5–5.1)
Sodium: 139 mmol/L (ref 135–145)

## 2019-04-24 LAB — TSH: TSH: 2.319 u[IU]/mL (ref 0.350–4.500)

## 2019-04-24 LAB — CBC
HCT: 43.1 % (ref 39.0–52.0)
Hemoglobin: 14.3 g/dL (ref 13.0–17.0)
MCH: 31.2 pg (ref 26.0–34.0)
MCHC: 33.2 g/dL (ref 30.0–36.0)
MCV: 93.9 fL (ref 80.0–100.0)
Platelets: 233 10*3/uL (ref 150–400)
RBC: 4.59 MIL/uL (ref 4.22–5.81)
RDW: 12 % (ref 11.5–15.5)
WBC: 5.2 10*3/uL (ref 4.0–10.5)
nRBC: 0 % (ref 0.0–0.2)

## 2019-04-24 LAB — MAGNESIUM: Magnesium: 1.9 mg/dL (ref 1.7–2.4)

## 2019-04-24 NOTE — ED Triage Notes (Signed)
Pt came in POV with a erratic pulse waiting for a pacemaker. Pulse dropping yesterday in the 20-30's. Pt was concerned and wanted to get checked out.

## 2019-04-24 NOTE — ED Notes (Signed)
Patient verbalizes understanding of discharge instructions. Opportunity for questioning and answers were provided. Armband removed by staff, pt discharged from ED. Pt. ambulatory and discharged home.  

## 2019-04-24 NOTE — ED Provider Notes (Signed)
[facility] EMERGENCY DEPARTMENT Provider Note   CSN: [id number] Arrival date & time: 04/24/19  2110     History No chief complaint on file.   Richard Martin is a 79 y.o. male.  Pt presents to the ED today with erratic HR.  It's been dropping into the 20s and 30s.  He has a hx of 2nd mobitz AV block and is scheduled to have a CRT implant on 3/22.  Pt said he's had several spells of feeling like he was going to pass out today.  That is when he checks his HR.  Pt feels ok right now sitting in the bed.        Past Medical History:  Diagnosis Date  . Diabetes mellitus without complication [facility])     Patient Active Problem List   Diagnosis Date Noted  . Syncope and collapse 01/13/2019  . Ischemic cardiomyopathy 01/13/2019  . Type 2 diabetes mellitus with complication, without long-term current use of insulin ([place]) 01/13/2019    Past Surgical History:  Procedure Laterality Date  . LEFT HEART CATH AND CORONARY ANGIOGRAPHY N/A 01/13/2019   Procedure: LEFT HEART CATH AND CORONARY ANGIOGRAPHY;  Surgeon: Martinique, Peter M, MD;  Location: [place] [facility];  Service: Cardiovascular;  Laterality: N/A;       Family History  Problem Relation Age of Onset  . Heart attack Father   . Aneurysm Mother     Social History   Tobacco Use  . Smoking status: Former Smoker    Types: Cigarettes  . Smokeless tobacco: Never Used  Substance Use Topics  . Alcohol use: No  . Drug use: No    Home Medications Prior to Admission medications   Medication Sig Start Date End Date Taking? Authorizing Provider  aspirin EC 81 MG tablet Take 81 mg by mouth daily.     [provider]  cholecalciferol (VITAMIN D3) 25 MCG (1000 UT) tablet Take 1,000 Units by mouth daily.    [provider]  lisinopril (ZESTRIL) 10 MG tablet Take 1 tablet (10 mg total) by mouth daily. 02/14/19 05/15/19  Donato Heinz, MD  metFORMIN (GLUCOPHAGE) 500 MG tablet Take 1 tablet  (500 mg total) by mouth 2 (two) times daily. 01/16/19   Martinique, Peter M, MD  rosuvastatin (CRESTOR) 10 MG tablet Take 10 mg by mouth daily.  08/17/12   [provider]  zinc gluconate 50 MG tablet Take 50 mg by mouth daily.    [provider]    Allergies    Otho Darner allergy]  Review of Systems   Review of Systems  Cardiovascular: Positive for palpitations.  Neurological: Positive for syncope.  All other systems reviewed and are negative.   Physical Exam Updated Vital Signs BP (!) 149/96   Pulse 75   Temp 98 F (36.7 C) (Oral)   Resp 10   Ht 5\' 10"  (1.778 m)   Wt 62.6 kg   SpO2 99%   BMI 19.80 kg/m   Physical Exam Vitals and nursing note reviewed.  Constitutional:      Appearance: Normal appearance.  HENT:     Head: Normocephalic and atraumatic.     Right Ear: External ear normal.     Left Ear: External ear normal.     Nose: Nose normal.     Mouth/Throat:     Mouth: Mucous membranes are moist.     Pharynx: Oropharynx is clear.  Eyes:     Extraocular Movements: Extraocular movements  intact.     Conjunctiva/sclera: Conjunctivae normal.     Pupils: Pupils are equal, round, and reactive to light.  Cardiovascular:     Rate and Rhythm: Rhythm irregular.     Comments: Monitor with intermittent episodes of 2nd degree AV block. Musculoskeletal:     Cervical back: Normal range of motion and neck supple.  Neurological:     Mental Status: He is alert.     ED Results / Procedures / Treatments   Labs (all labs ordered are listed, but only abnormal results are displayed) Labs Reviewed  BASIC METABOLIC PANEL - Abnormal; Notable for the following components:      Result Value   Glucose, Bld 214 (*)    All other components within normal limits  CBC  MAGNESIUM  TSH  TROPONIN I (HIGH SENSITIVITY)  TROPONIN I (HIGH SENSITIVITY)    EKG EKG Interpretation  Date/Time:  Sunday April 24 2019 21:37:27 EST Ventricular Rate:  63 PR  Interval:  166 QRS Duration: 155 QT Interval:  431 QTC Calculation: 498 R Axis:   -73 Text Interpretation: Sinus rhythm Sinus pause RBBB and LAFB Left ventricular hypertrophy Inferior infarct, acute Confirmed by Isla Pence [phone]) on 04/24/2019 9:39:29 PM   Radiology DG Chest Port 1 View  Result Date: 04/24/2019 CLINICAL DATA:  Bradycardia. EXAM: PORTABLE CHEST 1 VIEW COMPARISON:  11/23/2003 FINDINGS: Heart size upper limits of normal. Chronic aortic atherosclerosis. Emphysematous change the lungs. Pulmonary vascularity is normal. No edema or effusions. IMPRESSION: Emphysema.  Aortic atherosclerosis.  No active process otherwise. Electronically Signed   By: Nelson Chimes M.D.   On: 04/24/2019 21:49    Procedures Procedures (including critical care time)  Medications Ordered in ED Medications - No data to display  ED Course  I have reviewed the triage vital signs and the nursing notes.  Pertinent labs & imaging results that were available during my care of the patient were reviewed by me and considered in my medical decision making (see chart for details).    MDM Rules/Calculators/A&P                      Pt d/w Dr. Hassell Done (cards) who said this has been going on for several months and it does not look like anything new.  Pt looks good here.  He is stable for d/c.  F/u with Dr. Curt Bears .    Final Clinical Impression(s) / ED Diagnoses Final diagnoses:  2nd degree AV block    Rx / DC Orders ED Discharge Orders    None       Isla Pence, MD 04/24/19 2307

## 2019-04-25 NOTE — Progress Notes (Signed)
Message sent to Dr. Curt Bears and Dr. Gardiner Rhyme to inform them of patient being seen in the ED.   Richard Martin, AGNP-C [facility] 04/25/2019  6:49 AM

## 2019-04-27 NOTE — Telephone Encounter (Signed)
-----   Message from Will [name], MD sent at 04/25/2019  9:38 AM EST ----- Regarding: FW: Pt seen in ED for bradycardia Probably needs a phone call to see if the procedure needs to be moved up. ----- Message ----- From: Daune Perch, NP Sent: 04/25/2019   6:42 AM EST To: Will [name], MD, Stanton Kidney, RN, # Subject: Pt seen in ED for bradycardia                  Pt being followed by Dr. Gardiner Rhyme and Dr Curt Bears for 2nd degree AVB with plan for CRT-D in ?March. Pt was seen in the ED last night.   "Pt presents to the ED today with erratic HR.  It's been dropping into the 20s and 30s.  He has a hx of 2nd mobitz AV block and is scheduled to have a CRT implant on 3/22.  Pt said he's had several spells of feeling like he was going to pass out today.  That is when he checks his HR.  Pt feels ok right now sitting in the bed." See ED note.   Pt felt to be stable. No new findings and discharged from the ED.  Question possible move PPM placement sooner.

## 2019-04-27 NOTE — Telephone Encounter (Signed)
Followed up with pt Pt feeling better today. Pt going to keep appt on Tuesday w/ EP PA. Will discuss more with Camnitz on Monday to determine where pt procedure can be moved up to, if needed. Pt aware I will follow up next week to discuss further. Advised to call office/911 and/or go to ED if dizziness and/or HRs drop again. Patient verbalized understanding and agreeable to plan.

## 2019-04-29 NOTE — Telephone Encounter (Signed)
Will forward to Oda Kilts, [place] who is seeing pt on 3/2 for his FYI - in preparation to move procedure date for PPM implant to a sooner day.

## 2019-05-02 NOTE — Progress Notes (Signed)
PCP:  Celene Squibb, MD Primary Cardiologist: No primary care provider on file. Electrophysiologist: Dr. Kary Kos Richard Martin is a 79 y.o. male with past medical history of 2:1 AV block who presents today for routine electrophysiology followup. They are seen for Dr. Curt Bears.   Seen in the ED 04/24/19 for recurrent symptomatic bradycardia in the setting of 2:1 AV block. He hasn't had any similar episodes since, but still reports poor energy and rapid fatigue with exertion. No syncope.   The patient feels that he is tolerating medications without difficulties and is otherwise without complaint today.   Past Medical History:  Diagnosis Date  . Diabetes mellitus without complication [facility])    Past Surgical History:  Procedure Laterality Date  . LEFT HEART CATH AND CORONARY ANGIOGRAPHY N/A 01/13/2019   Procedure: LEFT HEART CATH AND CORONARY ANGIOGRAPHY;  Surgeon: Martinique, Peter M, MD;  Location: [facility];  Service: Cardiovascular;  Laterality: N/A;    Current Outpatient Medications  Medication Sig Dispense Refill  . aspirin EC 81 MG tablet Take 81 mg by mouth daily.     . cholecalciferol (VITAMIN D3) 25 MCG (1000 UT) tablet Take 1,000 Units by mouth daily.    Marland Kitchen lisinopril (ZESTRIL) 10 MG tablet Take 1 tablet (10 mg total) by mouth daily. 90 tablet 3  . metFORMIN (GLUCOPHAGE) 500 MG tablet Take 1 tablet (500 mg total) by mouth 2 (two) times daily.    . rosuvastatin (CRESTOR) 10 MG tablet Take 10 mg by mouth daily.     Marland Kitchen zinc gluconate 50 MG tablet Take 50 mg by mouth daily.     Current Facility-Administered Medications  Medication Dose Route Frequency Provider Last Rate Last Admin  . sodium chloride flush (NS) 0.9 % injection 3 mL  3 mL Intravenous Q12H Donato Heinz, MD        Allergies  Allergen Reactions  . Crab [Shellfish Allergy] Swelling    Social History   Socioeconomic History  . Marital status: Married    Spouse name: Not on file  . Number of  children: Not on file  . Years of education: Not on file  . Highest education level: Not on file  Occupational History  . Not on file  Tobacco Use  . Smoking status: Former Smoker    Types: Cigarettes  . Smokeless tobacco: Never Used  Substance and Sexual Activity  . Alcohol use: No  . Drug use: No  . Sexual activity: Not on file  Other Topics Concern  . Not on file  Social History Narrative  . Not on file   Social Determinants of Health   Financial Resource Strain:   . Difficulty of Paying Living Expenses: Not on file  Food Insecurity:   . Worried About Charity fundraiser in the Last Year: Not on file  . Ran Out of Food in the Last Year: Not on file  Transportation Needs:   . Lack of Transportation (Medical): Not on file  . Lack of Transportation (Non-Medical): Not on file  Physical Activity:   . Days of Exercise per Week: Not on file  . Minutes of Exercise per Session: Not on file  Stress:   . Feeling of Stress : Not on file  Social Connections:   . Frequency of Communication with Friends and Family: Not on file  . Frequency of Social Gatherings with Friends and Family: Not on file  . Attends Religious Services: Not on file  .  Active Member of Clubs or Organizations: Not on file  . Attends Archivist Meetings: Not on file  . Marital Status: Not on file  Intimate Partner Violence:   . Fear of Current or Ex-Partner: Not on file  . Emotionally Abused: Not on file  . Physically Abused: Not on file  . Sexually Abused: Not on file     Review of Systems: General: No chills, fever, night sweats or weight changes  Cardiovascular:  No chest pain, dyspnea on exertion, edema, orthopnea, palpitations, paroxysmal nocturnal dyspnea Dermatological: No rash, lesions or masses Respiratory: No cough, dyspnea Urologic: No hematuria, dysuria Abdominal: No nausea, vomiting, diarrhea, bright red blood per rectum, melena, or hematemesis Neurologic: No visual changes,  weakness, changes in mental status All other systems reviewed and are otherwise negative except as noted above.  Physical Exam: Vitals:   05/03/19 0843  BP: 136/76  Pulse: 70  SpO2: 99%  Weight: 138 lb 12.8 oz (63 kg)  Height: 5\' 10"  (1.778 m)    GEN- The patient is elderly and well appearing, alert and oriented x 3 today.   HEENT: normocephalic, atraumatic; sclera clear, conjunctiva pink; hearing intact; oropharynx clear; neck supple, no JVP Lymph- no cervical lymphadenopathy Lungs- Clear to ausculation bilaterally, normal work of breathing.  No wheezes, rales, rhonchi Heart- Regular rate and rhythm, no murmurs, rubs or gallops, PMI not laterally displaced GI- soft, non-tender, non-distended, bowel sounds present, no hepatosplenomegaly Extremities- no clubbing, cyanosis, or edema; DP/PT/radial pulses 2+ bilaterally MS- no significant deformity or atrophy Skin- warm and dry, no rash or lesion Psych- euthymic mood, full affect Neuro- strength and sensation are intact  EKG is not ordered. Personal review of EKG from 04/24/19 shows intermittent 2:1 AV block with wide QRS,   Assessment and Plan:  1. 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 Curt Bears. Discussed risks and benefits at length again today including risk of bleeding, infection, pneumothorax (lung damage), effusion (damage to the heart), heart attack, stroke, or death. He verbalizes understanding and agrees to proceed.   2. Chronic systolic CHF Continue lisinopril Will need BB added post CRT-D.   He is agreeable to proceed with CRT as planned. Will schedule for next available time with Dr. Curt Bears. Usual follow up afterwards.   Shirley Friar, PA-C  05/03/19 8:49 AM

## 2019-05-03 ENCOUNTER — Ambulatory Visit: Payer: Medicare Other

## 2019-05-03 ENCOUNTER — Other Ambulatory Visit
Admission: RE | Admit: 2019-05-03 | Discharge: 2019-05-03 | Disposition: A | Discharge status: Home / Self Care | Payer: Medicare Other | Source: Ambulatory Visit | Attending: Cardiology | Admitting: Cardiology

## 2019-05-03 ENCOUNTER — Ambulatory Visit (INDEPENDENT_AMBULATORY_CARE_PROVIDER_SITE_OTHER): Payer: Medicare Other | Admitting: Student

## 2019-05-03 ENCOUNTER — Encounter: Payer: Self-pay | Admitting: Student

## 2019-05-03 ENCOUNTER — Other Ambulatory Visit: Payer: Self-pay

## 2019-05-03 VITALS — BP 136/76 | HR 70 | Ht 70.0 in | Wt 138.8 lb

## 2019-05-03 DIAGNOSIS — Z20822 Contact with and (suspected) exposure to covid-19: Secondary | ICD-10-CM | POA: Diagnosis not present

## 2019-05-03 DIAGNOSIS — Z23 Encounter for immunization: Secondary | ICD-10-CM | POA: Diagnosis not present

## 2019-05-03 DIAGNOSIS — I429 Cardiomyopathy, unspecified: Secondary | ICD-10-CM | POA: Diagnosis not present

## 2019-05-03 DIAGNOSIS — I5021 Acute systolic (congestive) heart failure: Secondary | ICD-10-CM | POA: Diagnosis not present

## 2019-05-03 DIAGNOSIS — I441 Atrioventricular block, second degree: Secondary | ICD-10-CM | POA: Diagnosis not present

## 2019-05-03 DIAGNOSIS — Z01812 Encounter for preprocedural laboratory examination: Secondary | ICD-10-CM | POA: Diagnosis not present

## 2019-05-03 LAB — SARS CORONAVIRUS 2 (TAT 6-24 HRS): SARS Coronavirus 2: NEGATIVE

## 2019-05-03 NOTE — Progress Notes (Signed)
   Covid-19 Vaccination Clinic  Name:  ALOK MEZZANOTTE    MRN: [id number] DOB: 08-18-1940  05/03/2019  Mr. Plyler was observed post Covid-19 immunization for 15 minutes without incident. He was provided with Vaccine Information Sheet and instruction to access the V-Safe system.   Mr. Kilian was instructed to call 911 with any severe reactions post vaccine: Marland Kitchen Difficulty breathing  . Swelling of face and throat  . A fast heartbeat  . A bad rash all over body  . Dizziness and weakness   Immunizations Administered    Name Date Dose VIS Date Route   Pfizer COVID-19 Vaccine 05/03/2019 10:06 AM 0.3 mL 02/11/2019 Intramuscular   Manufacturer: [place]   Lot: [id number]   [place]: [id number]

## 2019-05-03 NOTE — Patient Instructions (Signed)
Medication Instructions:  none *If you need a refill on your cardiac medications before your next appointment, please call your pharmacy*   Lab Work: none If you have labs (blood work) drawn today and your tests are completely normal, you will receive your results only by: Marland Kitchen MyChart Message (if you have MyChart) OR . A paper copy in the mail If you have any lab test that is abnormal or we need to change your treatment, we will call you to review the results.   Testing/Procedures: Your physician has recommended that you have a defibrillator inserted. An implantable cardioverter defibrillator (ICD) is a small device that is placed in your chest or, in rare cases, your abdomen. This device uses electrical pulses or shocks to help control life-threatening, irregular heartbeats that could lead the heart to suddenly stop beating (sudden cardiac arrest). Leads are attached to the ICD that goes into your heart. This is done in the hospital and usually requires an overnight stay. Please see the instruction sheet given to you today for more information.     Follow-Up: 10-14 Days post 05/06/19 Device Clinic (wound check)                      91 Days post 05/06/19 with Dr Curt Bears                       Someone will call you to schedule  At [facility], you and your health needs are our priority.  As part of our continuing mission to provide you with exceptional heart care, we have created designated Provider Care Teams.  These Care Teams include your primary Cardiologist (physician) and Advanced Practice Providers (APPs -  Physician Assistants and Nurse Practitioners) who all work together to provide you with the care you need, when you need it.   Other Instructions   Please arrive at [facility] at 11:30 am 05/06/19 (Friday) Do not eat or drink after midnight the night prior to the procedure Do not take Metformin morning of procedure Plan for one night  stay Will need someone to drive you home at discharge  CoVid Screening:  TODAY:  [facility]                 [place]

## 2019-05-04 ENCOUNTER — Telehealth: Payer: Self-pay

## 2019-05-04 NOTE — Telephone Encounter (Signed)
-----   Message from Will [name], MD sent at 05/04/2019  2:20 PM EST ----- Stable preop labs

## 2019-05-04 NOTE — Telephone Encounter (Signed)
The patient has been notified of the result and verbalized understanding.  All questions (if any) were answered. Wilma Flavin, RN 05/04/2019 3:57 PM

## 2019-05-05 NOTE — Progress Notes (Signed)
Instructed patient on the following items: Arrival time 1030  Nothing to eat or drink after midnight No meds AM of procedure Responsible person to drive you home and stay with you for 24 hrs Wash with special soap night before and morning of procedure 

## 2019-05-06 ENCOUNTER — Ambulatory Visit (HOSPITAL_COMMUNITY)
Admission: RE | Admit: 2019-05-06 | Discharge: 2019-05-07 | Disposition: A | Discharge status: Home / Self Care | Payer: Medicare Other | Attending: Cardiology | Admitting: Cardiology

## 2019-05-06 ENCOUNTER — Ambulatory Visit (HOSPITAL_COMMUNITY)
Admission: RE | Disposition: A | Discharge status: Home / Self Care | Payer: Medicare Other | Source: Home / Self Care | Attending: Cardiology

## 2019-05-06 DIAGNOSIS — I428 Other cardiomyopathies: Secondary | ICD-10-CM

## 2019-05-06 DIAGNOSIS — E118 Type 2 diabetes mellitus with unspecified complications: Secondary | ICD-10-CM | POA: Diagnosis present

## 2019-05-06 DIAGNOSIS — E119 Type 2 diabetes mellitus without complications: Secondary | ICD-10-CM | POA: Diagnosis not present

## 2019-05-06 DIAGNOSIS — I5021 Acute systolic (congestive) heart failure: Secondary | ICD-10-CM

## 2019-05-06 DIAGNOSIS — I447 Left bundle-branch block, unspecified: Secondary | ICD-10-CM | POA: Diagnosis not present

## 2019-05-06 DIAGNOSIS — I5042 Chronic combined systolic (congestive) and diastolic (congestive) heart failure: Secondary | ICD-10-CM

## 2019-05-06 DIAGNOSIS — I5022 Chronic systolic (congestive) heart failure: Secondary | ICD-10-CM | POA: Diagnosis not present

## 2019-05-06 DIAGNOSIS — I441 Atrioventricular block, second degree: Secondary | ICD-10-CM | POA: Insufficient documentation

## 2019-05-06 DIAGNOSIS — Z006 Encounter for examination for normal comparison and control in clinical research program: Secondary | ICD-10-CM | POA: Insufficient documentation

## 2019-05-06 DIAGNOSIS — Z7984 Long term (current) use of oral hypoglycemic drugs: Secondary | ICD-10-CM | POA: Insufficient documentation

## 2019-05-06 DIAGNOSIS — Z7982 Long term (current) use of aspirin: Secondary | ICD-10-CM | POA: Diagnosis not present

## 2019-05-06 DIAGNOSIS — Z79899 Other long term (current) drug therapy: Secondary | ICD-10-CM | POA: Diagnosis not present

## 2019-05-06 DIAGNOSIS — Z95818 Presence of other cardiac implants and grafts: Secondary | ICD-10-CM

## 2019-05-06 HISTORY — DX: Chronic combined systolic (congestive) and diastolic (congestive) heart failure: I50.42

## 2019-05-06 HISTORY — PX: BIV ICD INSERTION CRT-D: EP1195

## 2019-05-06 HISTORY — DX: Left bundle-branch block, unspecified: I44.7

## 2019-05-06 HISTORY — DX: Other cardiomyopathies: I42.8

## 2019-05-06 HISTORY — DX: Presence of automatic (implantable) cardiac defibrillator: Z95.810

## 2019-05-06 LAB — GLUCOSE, CAPILLARY
Glucose-Capillary: 129 mg/dL — ABNORMAL HIGH (ref 70–99)
Glucose-Capillary: 94 mg/dL (ref 70–99)

## 2019-05-06 SURGERY — BIV ICD INSERTION CRT-D
Anesthesia: LOCAL

## 2019-05-06 MED ORDER — METFORMIN HCL 500 MG PO TABS
500.0000 mg | ORAL_TABLET | Freq: Two times a day (BID) | ORAL | Status: DC
  Administered 2019-05-07: 500 mg via ORAL
  Filled 2019-05-06: qty 1

## 2019-05-06 MED ORDER — ONDANSETRON HCL 4 MG/2ML IJ SOLN: 4.0000 mg | Freq: Four times a day (QID) | INTRAMUSCULAR | Status: DC | PRN

## 2019-05-06 MED ORDER — SODIUM CHLORIDE 0.9% FLUSH
3.0000 mL | Freq: Two times a day (BID) | INTRAVENOUS | Status: DC
  Administered 2019-05-06 – 2019-05-07 (×2): 3 mL via INTRAVENOUS

## 2019-05-06 MED ORDER — CEFAZOLIN SODIUM-DEXTROSE 2-4 GM/100ML-% IV SOLN
2.0000 g | INTRAVENOUS | Status: AC
  Administered 2019-05-06: 2 g via INTRAVENOUS
  Filled 2019-05-06: qty 100

## 2019-05-06 MED ORDER — VITAMIN D 25 MCG (1000 UNIT) PO TABS
1000.0000 [IU] | ORAL_TABLET | Freq: Every day | ORAL | Status: DC
  Administered 2019-05-07: 1000 [IU] via ORAL
  Filled 2019-05-06: qty 1

## 2019-05-06 MED ORDER — ZINC SULFATE 220 (50 ZN) MG PO CAPS
220.0000 mg | ORAL_CAPSULE | Freq: Every day | ORAL | Status: DC
  Administered 2019-05-07: 220 mg via ORAL
  Filled 2019-05-06: qty 1

## 2019-05-06 MED ORDER — IOHEXOL 350 MG/ML SOLN
INTRAVENOUS | Status: DC | PRN
  Administered 2019-05-06: 12 mL

## 2019-05-06 MED ORDER — HEPARIN (PORCINE) IN NACL 1000-0.9 UT/500ML-% IV SOLN
INTRAVENOUS | Status: AC
  Filled 2019-05-06: qty 500

## 2019-05-06 MED ORDER — HEPARIN (PORCINE) IN NACL 2-0.9 UNITS/ML
INTRAMUSCULAR | Status: AC | PRN
  Administered 2019-05-06: 500 mL

## 2019-05-06 MED ORDER — LIDOCAINE HCL (PF) 1 % IJ SOLN
INTRAMUSCULAR | Status: DC | PRN
  Administered 2019-05-06: 60 mL

## 2019-05-06 MED ORDER — SODIUM CHLORIDE 0.9 % IV SOLN
INTRAVENOUS | Status: AC
  Filled 2019-05-06: qty 2

## 2019-05-06 MED ORDER — LIDOCAINE HCL 1 % IJ SOLN
INTRAMUSCULAR | Status: AC
  Filled 2019-05-06: qty 60

## 2019-05-06 MED ORDER — MIDAZOLAM HCL 5 MG/5ML IJ SOLN
INTRAMUSCULAR | Status: AC
  Filled 2019-05-06: qty 5

## 2019-05-06 MED ORDER — CEFAZOLIN SODIUM-DEXTROSE 2-4 GM/100ML-% IV SOLN
INTRAVENOUS | Status: AC
  Filled 2019-05-06: qty 100

## 2019-05-06 MED ORDER — CEFAZOLIN SODIUM-DEXTROSE 1-4 GM/50ML-% IV SOLN
1.0000 g | Freq: Four times a day (QID) | INTRAVENOUS | Status: AC
  Administered 2019-05-06 – 2019-05-07 (×3): 1 g via INTRAVENOUS
  Filled 2019-05-06 (×4): qty 50

## 2019-05-06 MED ORDER — ROSUVASTATIN CALCIUM 5 MG PO TABS
10.0000 mg | ORAL_TABLET | Freq: Every day | ORAL | Status: DC
  Administered 2019-05-06 – 2019-05-07 (×2): 10 mg via ORAL
  Filled 2019-05-06 (×2): qty 2

## 2019-05-06 MED ORDER — SODIUM CHLORIDE 0.9 % IV SOLN: INTRAVENOUS | Status: DC

## 2019-05-06 MED ORDER — SODIUM CHLORIDE 0.9 % IV SOLN
80.0000 mg | INTRAVENOUS | Status: AC
  Administered 2019-05-06: 80 mg

## 2019-05-06 MED ORDER — CHLORHEXIDINE GLUCONATE 4 % EX LIQD
4.0000 "application " | Freq: Once | CUTANEOUS | Status: DC
  Filled 2019-05-06: qty 60

## 2019-05-06 MED ORDER — ZINC GLUCONATE 50 MG PO TABS: 50.0000 mg | ORAL_TABLET | Freq: Every day | ORAL | Status: DC

## 2019-05-06 MED ORDER — LISINOPRIL 10 MG PO TABS
10.0000 mg | ORAL_TABLET | Freq: Every day | ORAL | Status: DC
  Administered 2019-05-06 – 2019-05-07 (×2): 10 mg via ORAL
  Filled 2019-05-06 (×2): qty 1

## 2019-05-06 MED ORDER — FENTANYL CITRATE (PF) 100 MCG/2ML IJ SOLN
INTRAMUSCULAR | Status: DC | PRN
  Administered 2019-05-06 (×4): 25 ug via INTRAVENOUS

## 2019-05-06 MED ORDER — ASPIRIN EC 81 MG PO TBEC
81.0000 mg | DELAYED_RELEASE_TABLET | Freq: Every day | ORAL | Status: DC
  Administered 2019-05-07: 81 mg via ORAL
  Filled 2019-05-06: qty 1

## 2019-05-06 MED ORDER — ACETAMINOPHEN 500 MG PO TABS: 500.0000 mg | ORAL_TABLET | Freq: Every day | ORAL | Status: DC | PRN

## 2019-05-06 MED ORDER — ACETAMINOPHEN 325 MG PO TABS
325.0000 mg | ORAL_TABLET | ORAL | Status: DC | PRN
  Administered 2019-05-06 – 2019-05-07 (×2): 650 mg via ORAL
  Filled 2019-05-06 (×2): qty 2

## 2019-05-06 MED ORDER — MIDAZOLAM HCL 5 MG/5ML IJ SOLN
INTRAMUSCULAR | Status: DC | PRN
  Administered 2019-05-06 (×4): 1 mg via INTRAVENOUS

## 2019-05-06 MED ORDER — FENTANYL CITRATE (PF) 100 MCG/2ML IJ SOLN
INTRAMUSCULAR | Status: AC
  Filled 2019-05-06: qty 2

## 2019-05-06 SURGICAL SUPPLY — 17 items
BALLN COR SINUS VENO 6FR 80 (BALLOONS) ×2
BALLOON COR SINUS VENO 6FR 80 (BALLOONS) ×1 IMPLANT
CABLE SURGICAL S-101-97-12 (CABLE) ×2 IMPLANT
CATH CPS DIRECT 135 DS2C020 (CATHETERS) ×2 IMPLANT
CATH HEX JOS 2-5-2 65CM 6F REP (CATHETERS) ×2 IMPLANT
CPS IMPLANT KIT 410190 (MISCELLANEOUS) ×2 IMPLANT
ICD GALLANT HFCRTD CDHFA500Q (ICD Generator) ×2 IMPLANT
KIT ESSENTIALS PG (KITS) ×2 IMPLANT
LEAD DURATA 7122Q-65CM (Lead) ×2 IMPLANT
LEAD QUARTET 1458Q-86CM (Lead) ×2 IMPLANT
LEAD TENDRIL MRI 52CM LPA1200M (Lead) ×2 IMPLANT
PAD PRO RADIOLUCENT 2001M-C (PAD) ×2 IMPLANT
SHEATH 7FR PRELUDE SNAP 13 (SHEATH) ×2 IMPLANT
SHEATH 8FR PRELUDE SNAP 13 (SHEATH) ×2 IMPLANT
TRAY PACEMAKER INSERTION (PACKS) ×2 IMPLANT
WIRE ACUITY WHISPER EDS 4648 (WIRE) ×2 IMPLANT
WIRE HI TORQ VERSACORE-J 145CM (WIRE) ×2 IMPLANT

## 2019-05-06 NOTE — Discharge Instructions (Signed)
° ° ° ° ° ° ° °  Supplemental Discharge Instructions for  Pacemaker/Defibrillator Patients  Activity No heavy lifting or vigorous activity with your left/right arm for 6 to 8 weeks.  Do not raise your left/right arm above your head for one week.  Gradually raise your affected arm as drawn below.              05/10/2019                 05/11/2019               05/12/2019                 05/13/2019  __  NO DRIVING for 1 week  ; you may begin driving on  [id number]   .  WOUND CARE - Keep the wound area clean and dry.  Do not get this area wet, no showers until cleared toa t your wound check visit - The tape/steri-strips on your wound will fall off; do not pull them off.  No bandage is needed on the site.  DO  NOT apply any creams, oils, or ointments to the wound area. - If you notice any drainage or discharge from the wound, any swelling or bruising at the site, or you develop a fever > 101? F after you are discharged home, call the office at once.  Special Instructions - You are still able to use cellular telephones; use the ear opposite the side where you have your pacemaker/defibrillator.  Avoid carrying your cellular phone near your device. - When traveling through airports, show security personnel your identification card to avoid being screened in the metal detectors.  Ask the security personnel to use the hand wand. - Avoid arc welding equipment, MRI testing (magnetic resonance imaging), TENS units (transcutaneous nerve stimulators).  Call the office for questions about other devices. - Avoid electrical appliances that are in poor condition or are not properly grounded. - Microwave ovens are safe to be near or to operate.  Additional information for defibrillator patients should your device go off: - If your device goes off ONCE and you feel fine afterward, notify the device clinic nurses. - If your device goes off ONCE and you do not feel well afterward, call 911. - If your device goes off  TWICE, call 911. - If your device goes off THREE times in one day, call 911.  DO NOT DRIVE YOURSELF OR A FAMILY MEMBER WITH A DEFIBRILLATOR TO THE HOSPITAL--CALL 911.

## 2019-05-06 NOTE — H&P (Signed)
ICD Criteria  Current LVEF:35%. Within 12 months prior to implant: Yes   Heart failure history: Yes, Class II  Cardiomyopathy history: Yes, Non-Ischemic Cardiomyopathy.  Atrial Fibrillation/Atrial Flutter: No.  Ventricular tachycardia history: No.  Cardiac arrest history: No.  History of syndromes with risk of sudden death: No.  Previous ICD: No.  Current ICD indication: Primary  PPM indication: Yes. Pacing type: Ventricular. Greater than 40% RV pacing requirement anticipated. Indication: 2:1 AV Block  Class I or II Bradycardia indication present: Yes  Beta Blocker therapy for 3 or more months: No, medical reason.  Ace Inhibitor/ARB therapy for 3 or more months: Yes, prescribed.    I have seen Richard Martin is a 79 y.o. malepre-procedural and has been referred by [name] for consideration of ICD implant for primary prevention of sudden death.  The patient's chart has been reviewed and they meet criteria for ICD implant.  I have had a thorough discussion with the patient reviewing options.  The patient and their family (if available) have had opportunities to ask questions and have them answered. The patient and I have decided together through the [employer] Tool to implant ICD at this time.  Risks, benefits, alternatives to ICD implantation were discussed in detail with the patient today. The patient  understands that the risks include but are not limited to bleeding, infection, pneumothorax, perforation, tamponade, vascular damage, renal failure, MI, stroke, death, inappropriate shocks, and lead dislodgement and  wishes to proceed.

## 2019-05-06 NOTE — Discharge Summary (Addendum)
ELECTROPHYSIOLOGY PROCEDURE DISCHARGE SUMMARY    Patient ID: Richard Martin,  MRN: [id number], DOB/AGE: 1941-01-20 79 y.o.  Admit date: 05/06/2019 Discharge date: 05/07/2019   Primary Care Physician: Celene Squibb, MD  Primary Cardiologist: Dr. Gardiner Rhyme Electrophysiologist: Dr. Curt Bears  Primary Discharge Diagnosis:  1. NICM 2. 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 LBBB  Secondary Discharge Diagnosis:  1. DM 2. Chronic CHF (systolic)  Allergies  Allergen Reactions  . Crab [Shellfish Allergy] Swelling     Procedures This Admission:  1.  Implantation of a SJM CRT-DD on 05/06/2019 by Dr Curt Bears.  The patient received a [place] HF CRT-D [id number] Number [id number] ) device, Abbott model [id number] (serial # [id number] ) right atrial lead and an Abbott model M950929 (serial number [id number]) right ventricular defibrillator lead, Abbott model 1458- 86 (serial number [id number] ) lead (LV) DFT's were deferred at time of implant.   There were no immediate post procedure complications. 2.  CXR on 05/07/2019 demonstrated no pneumothorax status post device implantation.   Brief HPI: Richard Martin is a 79 y.o. male was referred to electrophysiology in the outpatient setting for NICM, bradycardia, heart block, and consideration of ICD implantation.  Past medical history includes above.  The patient has persistent LV dysfunction, unable to take beta blocker therapy 2/2 baseline bradycardia and heart block.  Risks, benefits, and alternatives to ICD implantation were reviewed with the patient who wished to proceed.   Hospital Course:  The patient was admitted and underwent implantation of a CRT-D with details as outlined above. He was monitored on telemetry overnight which demonstrated appropriate pacing.  Left chest was without hematoma or ecchymosis.  The device was interrogated and found to be functioning normally.  CXR was obtained and demonstrated no pneumothorax status post device  implantation.  Wound care, arm mobility, and restrictions were reviewed with the patient.  The patient feels well this morning, no CP or SOB, he was examined by Dr. Curt Bears and considered stable for discharge to home.   The patient's discharge medications include an ACE/ARB (lisinopril).  Though he was not on a beta-blocker preoperatively, with device in place, he Richard Martin be discharged home on carvedilol 3.125 mg twice daily.  Physical Exam: Vitals:   05/06/19 1558 05/06/19 1649 05/06/19 2134 05/07/19 0613  BP:   138/65 (!) 151/84  Pulse:  73 72 81  Resp:  15 10 (!) 25  Temp:  97.7 F (36.5 C) 98.3 F (36.8 C) 97.8 F (36.6 C)  TempSrc:  Oral Oral Oral  SpO2: 96% 99% 94% 100%  Weight:  61.7 kg  60 kg  Height:  5\' 10"  (1.778 m)      GEN- The patient is well appearing, alert and oriented x 3 today.   HEENT: normocephalic, atraumatic; sclera clear, conjunctiva pink; hearing intact; oropharynx clear Lungs- CTA b/l, normal work of breathing.  No wheezes, rales, rhonchi Heart- RRR, no murmurs, rubs or gallops, PMI not laterally displaced GI- soft, non-tender, non-distended Extremities- no clubbing, cyanosis, or edema MS- no significant deformity or atrophy Skin- warm and dry, no rash or lesion, left chest without hematoma/ecchymosis Psych- euthymic mood, full affect Neuro- no gross defecits  Labs:   Lab Results  Component Value Date   WBC 5.2 04/24/2019   HGB 14.3 04/24/2019   HCT 43.1 04/24/2019   MCV 93.9 04/24/2019   PLT 233 04/24/2019    Discharge Medications:  Allergies as of 05/07/2019      Reactions  Crab [shellfish Allergy] Swelling      Medication List    TAKE these medications   acetaminophen 500 MG tablet Commonly known as: TYLENOL Take 500 mg by mouth daily as needed for moderate pain or headache.   aspirin EC 81 MG tablet Take 81 mg by mouth daily.   carvedilol 3.125 MG tablet Commonly known as: Coreg Take 1 tablet (3.125 mg total) by mouth 2 (two) times  daily with a meal.   cholecalciferol 25 MCG (1000 UNIT) tablet Commonly known as: VITAMIN D3 Take 1,000 Units by mouth daily.   Crestor 10 MG tablet Generic drug: rosuvastatin Take 10 mg by mouth daily.   lisinopril 10 MG tablet Commonly known as: ZESTRIL Take 1 tablet (10 mg total) by mouth daily.   metFORMIN 500 MG tablet Commonly known as: GLUCOPHAGE Take 1 tablet (500 mg total) by mouth 2 (two) times daily.   zinc gluconate 50 MG tablet Take 50 mg by mouth daily.      Disposition:  Home 45 Discharge Instructions    (HEART FAILURE PATIENTS) Call MD:  Anytime you have any of the following symptoms: 1) 3 pound weight gain in 24 hours or 5 pounds in 1 week 2) shortness of breath, with or without a dry hacking cough 3) swelling in the hands, feet or stomach 4) if you have to sleep on extra pillows at night in order to breathe.   Complete by: As directed    Call MD for:  difficulty breathing, headache or visual disturbances   Complete by: As directed    Call MD for:  redness, tenderness, or signs of infection (pain, swelling, redness, odor or green/yellow discharge around incision site)   Complete by: As directed    Call MD for:  severe uncontrolled pain   Complete by: As directed    Call MD for:  temperature >100.4   Complete by: As directed    Diet - low sodium heart healthy   Complete by: As directed    Increase activity slowly   Complete by: As directed      Follow-up Information    [facility] Follow up.   Specialty: Cardiology Why: 05/17/2019 @ 9:00AM, wound check visit Contact information: [address] [place] [phone]       Constance Haw, MD Follow up.   Specialty: Cardiology Why: 08/19/2019 @ 2:00PM Contact information: [place] [address] [phone]           Duration of Discharge Encounter: Greater than 30 minutes including physician  time.  Signed, Murray Hodgkins, NP 05/07/2019 9:17 AM     I have seen and examined this patient with Ignacia Bayley.  Agree with above, note added to reflect my findings.  On exam, RRR, no murmurs, lungs clear.  She is now status post Jude CRT-D for nonischemic cardiomyopathy and second-degree AV block.  Device functioning appropriately.  Chest x-ray and interrogation without issue.  Plan for discharge today with follow-up in device clinic.  Conita Amenta start on low-dose carvedilol which can be titrated up as this was not able to be started due to his heart block.  Everley Evora M. Nakeda Lebron MD 05/07/2019 9:47 AM

## 2019-05-07 ENCOUNTER — Ambulatory Visit (HOSPITAL_COMMUNITY): Payer: Medicare Other

## 2019-05-07 ENCOUNTER — Encounter (HOSPITAL_COMMUNITY): Payer: Self-pay | Admitting: Cardiology

## 2019-05-07 DIAGNOSIS — Z7982 Long term (current) use of aspirin: Secondary | ICD-10-CM | POA: Diagnosis not present

## 2019-05-07 DIAGNOSIS — I428 Other cardiomyopathies: Secondary | ICD-10-CM

## 2019-05-07 DIAGNOSIS — I447 Left bundle-branch block, unspecified: Secondary | ICD-10-CM | POA: Diagnosis not present

## 2019-05-07 DIAGNOSIS — E119 Type 2 diabetes mellitus without complications: Secondary | ICD-10-CM | POA: Diagnosis not present

## 2019-05-07 DIAGNOSIS — I5021 Acute systolic (congestive) heart failure: Secondary | ICD-10-CM | POA: Insufficient documentation

## 2019-05-07 DIAGNOSIS — I5022 Chronic systolic (congestive) heart failure: Secondary | ICD-10-CM

## 2019-05-07 DIAGNOSIS — Z79899 Other long term (current) drug therapy: Secondary | ICD-10-CM | POA: Diagnosis not present

## 2019-05-07 DIAGNOSIS — Z45018 Encounter for adjustment and management of other part of cardiac pacemaker: Secondary | ICD-10-CM | POA: Diagnosis not present

## 2019-05-07 DIAGNOSIS — Z006 Encounter for examination for normal comparison and control in clinical research program: Secondary | ICD-10-CM | POA: Diagnosis not present

## 2019-05-07 DIAGNOSIS — Z7984 Long term (current) use of oral hypoglycemic drugs: Secondary | ICD-10-CM | POA: Diagnosis not present

## 2019-05-07 DIAGNOSIS — I441 Atrioventricular block, second degree: Secondary | ICD-10-CM | POA: Diagnosis not present

## 2019-05-07 DIAGNOSIS — Z950 Presence of cardiac pacemaker: Secondary | ICD-10-CM | POA: Diagnosis not present

## 2019-05-07 MED ORDER — CARVEDILOL 3.125 MG PO TABS: 3.1250 mg | ORAL_TABLET | Freq: Two times a day (BID) | ORAL | 6 refills | Status: DC

## 2019-05-07 NOTE — Progress Notes (Signed)
Dressing over ICD site is nearly saturated with red, bloody drainage.  ICD site without s/s of hematoma.  Dr. Kalman Shan at bedside to assess.  No new orders.  Pt states he has a history of bleeding following surgical removal of skin lesions on his back; states "it took 2 weeks for it to stop bleeding."  Will continue to monitor.  Jodell Cipro

## 2019-05-07 NOTE — Plan of Care (Signed)

## 2019-05-09 ENCOUNTER — Ambulatory Visit: Payer: Medicare Other | Admitting: Cardiology

## 2019-05-09 MED FILL — Lidocaine HCl Local Inj 1%: INTRAMUSCULAR | Qty: 60 | Status: AC

## 2019-05-17 ENCOUNTER — Other Ambulatory Visit: Payer: Self-pay

## 2019-05-17 ENCOUNTER — Ambulatory Visit (INDEPENDENT_AMBULATORY_CARE_PROVIDER_SITE_OTHER): Payer: Medicare Other | Admitting: *Deleted

## 2019-05-17 DIAGNOSIS — R55 Syncope and collapse: Secondary | ICD-10-CM

## 2019-05-17 DIAGNOSIS — I429 Cardiomyopathy, unspecified: Secondary | ICD-10-CM

## 2019-05-17 DIAGNOSIS — I255 Ischemic cardiomyopathy: Secondary | ICD-10-CM

## 2019-05-17 LAB — CUP PACEART INCLINIC DEVICE CHECK
Brady Statistic RA Percent Paced: 3.7 %
Brady Statistic RV Percent Paced: 99 %
Date Time Interrogation Session: 20210316094750
Implantable Lead Implant Date: 20210305
Implantable Lead Implant Date: 20210305
Implantable Lead Implant Date: 20210305
Implantable Lead Location: 753858
Implantable Lead Location: 753859
Implantable Lead Location: 753860
Implantable Pulse Generator Implant Date: 20210305
Lead Channel Pacing Threshold Amplitude: 0.625 V
Lead Channel Pacing Threshold Amplitude: 0.75 V
Lead Channel Pacing Threshold Amplitude: 1.5 V
Lead Channel Pacing Threshold Pulse Width: 0.5 ms
Lead Channel Pacing Threshold Pulse Width: 0.5 ms
Lead Channel Pacing Threshold Pulse Width: 0.5 ms
Lead Channel Sensing Intrinsic Amplitude: 11 mV
Lead Channel Sensing Intrinsic Amplitude: 4 mV
Lead Channel Setting Pacing Amplitude: 1.375
Lead Channel Setting Pacing Amplitude: 1.5 V
Lead Channel Setting Pacing Amplitude: 1.5 V
Lead Channel Setting Pacing Pulse Width: 0.5 ms
Lead Channel Setting Pacing Pulse Width: 0.5 ms
Lead Channel Setting Sensing Sensitivity: 0.5 mV
Pulse Gen Serial Number: 111018177

## 2019-05-17 NOTE — Patient Instructions (Signed)
Call the office if you have any increased swelling, any redness, or drainage from wound site. [phone]

## 2019-05-17 NOTE — Progress Notes (Signed)
Wound check appointment. Steri-strips removed. Wound without redness or edema. Incision edges approximated, wound well healed. Normal device function. Thresholds, sensing, and impedances consistent with implant measurements. Device programmed at 3.5V for extra safety margin until 3 month visit. Histogram distribution appropriate for patient and level of activity. No mode switches or ventricular arrhythmias noted. Patient educated about wound care, arm mobility, lifting restrictions, shock plan. ROV with Dr Curt Bears on 08/09/19. Remote monitoring every 3 months and next remote scheduled for 08/05/19.

## 2019-05-23 NOTE — Progress Notes (Addendum)
Cardiology Office Note:    Date:  06/02/2019   ID:  Richard Martin, DOB 04-May-1940, MRN [id number]  PCP:  Celene Squibb, MD  Cardiologist:  No primary care provider on file.  Electrophysiologist:  None   Referring MD: Celene Squibb, MD   Chief Complaint  Patient presents with  . Congestive Heart Failure    History of Present Illness:    Richard Martin is a 79 y.o. male with a hx of diabetes, newly diagnosed systolic heart failure and second-degree Mobitz 2 AV block presents for follow-up.  He was initially seen on 12/31/2018 after being referred by Dr. Nevada Crane for evaluation of EKG abnormalities and syncopal episode.  He had a episode of syncope where he was walking to his kitchen and had sudden loss of consciousness.  Feels he was only unconscious for a few seconds and had no confusion afterwards.  Had been sitting in his chair prior to walk to the kitchen, states the position change happened about 30 seconds prior to syncopal episode.  He checked his blood pressure and it was elevated after the episode.  He did not report any symptoms prior to losing consciousness.  He was seen in clinic on 12/31/2018, and orthostatics were checked which were normal.  Rhythm strip demonstrated intermittent 2:1 AV block, with a wide QRS.  He was referred to EP for evaluation for pacemaker, and an echocardiogram was ordered.  TTE was done on 01/04/2019, which showed EF 40 to 45%, with aneurysmal basal inferior segment.  TTE was also notable for grade 2 diastolic dysfunction, normal RV function, no significant valvular disease.  Given new systolic dysfunction, cardiac catheterization was done on 01/13/2019, which showed normal coronary arteries.  Cardiac MRI was done on 02/11/2019, which showed EF 35%, basal inferior aneurysm, transmural basal inferior LGE, basal inferoseptal mid wall LGE.  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  Zio patch x14 days was completed, which showed frequent second-degree Mobitz 2 AV  block, with symptoms of dizziness corresponding to Mobitz 2 AV block.  He underwent cardiac PET at [facility] on 04/01/2019, which showed no active myocardial inflammation.  He underwent BiV ICD placement by Dr. Curt Bears on 05/06/2019.  Since last clinic visit, had ICD placed.  Reports lightheadedness and syncope has resolved.  Reports dyspnea has improved.  Fatigue significantly improved.      Past Medical History:  Diagnosis Date  . Cardiac resynchronization therapy defibrillator (CRT-D) in place    a. 05/2019 s/p SJM [facility] HF CRT-D [id number] (ser# [id number]).  . Chronic combined systolic (congestive) and diastolic (congestive) heart failure ([place])    a. 01/2019 Echo: EF 40-45%, focal basal inf aneurysmal region. Gr2 DD. Nl RV fxn. Mildly dil RA. Mod TR. Triv PR; b. 02/2019 cMRI: EF 35%.  . Diabetes mellitus without complication ([place])   . LBBB (left bundle branch block)   . NICM (nonischemic cardiomyopathy) ([place])    a. 01/2019 Echo: EF 40-45%; b. 01/2019 Cath: nl cors. EF 35-45%; c. 02/2019 cMRI: Basal inf and basal infsept midwall LGE. EF 35%; d. 05/2019 s/p SJM [facility] HF CRT-D [id number] (ser# [id number]).    Past Surgical History:  Procedure Laterality Date  . BIV ICD INSERTION CRT-D N/A 05/06/2019   Procedure: BIV ICD INSERTION CRT-D;  Surgeon: Constance Haw, MD;  Location: [place] [facility];  Service: Cardiovascular;  Laterality: N/A;  . LEFT HEART CATH AND CORONARY ANGIOGRAPHY N/A 01/13/2019   Procedure: LEFT HEART CATH AND CORONARY ANGIOGRAPHY;  Surgeon: Martinique, Peter  M, MD;  Location: [place] [facility];  Service: Cardiovascular;  Laterality: N/A;    Current Medications: Current Meds  Medication Sig  . acetaminophen (TYLENOL) 500 MG tablet Take 500 mg by mouth daily as needed for moderate pain or headache.  Marland Kitchen aspirin EC 81 MG tablet Take 81 mg by mouth daily.   . carvedilol (COREG) 3.125 MG tablet Take 1 tablet (3.125 mg total) by mouth 2 (two) times daily with a meal.  .  cholecalciferol (VITAMIN D3) 25 MCG (1000 UT) tablet Take 1,000 Units by mouth daily.  . metFORMIN (GLUCOPHAGE) 500 MG tablet Take 1 tablet (500 mg total) by mouth 2 (two) times daily.  . rosuvastatin (CRESTOR) 10 MG tablet Take 10 mg by mouth daily.   Marland Kitchen zinc gluconate 50 MG tablet Take 50 mg by mouth daily.     Allergies:   Otho Darner allergy]   Social History   Socioeconomic History  . Marital status: Married    Spouse name: Not on file  . Number of children: Not on file  . Years of education: Not on file  . Highest education level: Not on file  Occupational History  . Not on file  Tobacco Use  . Smoking status: Former Smoker    Types: Cigarettes  . Smokeless tobacco: Never Used  Substance and Sexual Activity  . Alcohol use: No  . Drug use: No  . Sexual activity: Not on file  Other Topics Concern  . Not on file  Social History Narrative  . Not on file   Social Determinants of Health   Financial Resource Strain:   . Difficulty of Paying Living Expenses:   Food Insecurity:   . Worried About Charity fundraiser in the Last Year:   . Arboriculturist in the Last Year:   Transportation Needs:   . Film/video editor (Medical):   Marland Kitchen Lack of Transportation (Non-Medical):   Physical Activity:   . Days of Exercise per Week:   . Minutes of Exercise per Session:   Stress:   . Feeling of Stress :   Social Connections:   . Frequency of Communication with Friends and Family:   . Frequency of Social Gatherings with Friends and Family:   . Attends Religious Services:   . Active Member of Clubs or Organizations:   . Attends Archivist Meetings:   Marland Kitchen Marital Status:      Family History: Father died of MI at age 43  ROS:   Please see the history of present illness.    All other systems reviewed and are negative.  EKGs/Labs/Other Studies Reviewed:    The following studies were reviewed today:   EKG:  EKG is ordered today.  The ekg ordered today shows  sinus rhythm, BiV paced   TTE 01/04/19:  1. Left ventricular ejection fraction, by visual estimation, is 40 to 45%. The left ventricle has normal function. There is no left ventricular hypertrophy.  2. Basal inferior segment is abnormal.  3. Left ventricular diastolic parameters are consistent with Grade II diastolic dysfunction (pseudonormalization).  4. Global right ventricle has normal systolic function.The right ventricular size is mildly enlarged. No increase in right ventricular wall thickness.  5. Left atrial size was normal.  6. Right atrial size was mildly dilated.  7. The mitral valve is normal in structure. No evidence of mitral valve regurgitation. No evidence of mitral stenosis.  8. The tricuspid valve is normal in structure. Tricuspid valve regurgitation  moderate.  9. The aortic valve is normal in structure. Aortic valve regurgitation is not visualized. No evidence of aortic valve sclerosis or stenosis. 10. The pulmonic valve was normal in structure. Pulmonic valve regurgitation is trivial. 11. Mildly elevated pulmonary artery systolic pressure. 12. The inferior vena cava is normal in size with greater than 50% respiratory variability, suggesting right atrial pressure of 3 mmHg. 13. Focal basal inferior aneursymal region/infarct. 14. There is redundancy of the interatrial septum.  LHC 01/13/19:  There is moderate left ventricular systolic dysfunction.  LV end diastolic pressure is normal.  The left ventricular ejection fraction is 35-45% by visual estimate.   1. Normal coronary anatomy 2. Moderate LV dysfunction 3. inferobasal aneurysm 4. Normal LVEDP  CMR 02/11/19: 1. 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 While transmural LGE suggests ischemic etiology, it is not in a coronary distribution and patient had normal coronaries on recent cath. 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 2. Mild LV dilatation with moderate systolic dysfunction (EF [id number]). Basal inferior aneurysm 3.  Normal RV size and systolic function (EF [id number])  Cardiac monitor 02/13/19:  Frequent second degree Mobitz II AV block. Symptoms of dizziness corresponded to Mobitz II AV block   14 days of data recorded on Zio monitor. Patient had a min HR of 29 bpm, max HR of 123 bpm, and avg HR of 69 bpm. Predominant underlying rhythm was Sinus Rhythm.  No VT, SVT, atrial fibrillation, or pauses noted. Isolated atrial and ventricular ectopy was rare (<1%).  8523 episodes of second degree Mobitz II AV block.  Patient triggered events, with symptoms reported of dizziness, corresponded to Mobitz II AV block.   Cardiac PET 04/01/19:  Scattered bilateral pulmonary nodules, largest in the right lower lobe measuring up to 1.1 cm with minimal metabolic activity,  which may represent atelectasis; recommend follow up chest CT in 3-6 months to assess for resolution.  FINAL COMMENTS  Rb-82 Cardiac PET/CT Myocardial Perfusion Imaging Study:   Abnormal rest perfusion in 4 out of 17 segments of the myocardium in a non "vascular" territory. This is consistent with  "scar/fibrosis" of the myocardium due to prior inflamation/infiltrative disease. It is not coronary artery disease related.   Rb-82 Cardiac PET/CT Functonal Imaging Study:   Sever decreased in left ventricular function. Dilated left ventricle. These findings are compatible with a nonischemic  cardiomyopathy.   F-18 FDG Cardiac PET/CT Myocardial Metabolic Study:   No evidence of active inflamation of the myocardium. No evidence of active inflamatory process of the myocardium to suggest  active sarcoid or active myocarditis.   Limited F-18 FDG PET/CT of the Chest and Abdomen:   Note that evaluation is somewhat limited due to lack of IV contrast and slice thickness, within this limitation:   The  thyroid is normal in appearance.   No mediastinal, hilar, or axillary lymphadenopathy. There are tiny mediastinal lymph nodes which do not meet size criteria for  pathologic enlargement. There are severe coronary artery and moderate thoracic aortic calcifications. No pericardial effusion.  The esophagus is normal in appearance.   No pleural effusion. No pneumothorax. Severe bilateral centrilobular and paraseptal emphysema. There are basal predominant  subpleural reticulations. There are scattered bilateral pulmonary nodules measuring up to 1.1 cm, for example:   Nodule #1: Right upper lobe nodule measuring 6 mm (series 3, image 80).  Nodule #2: Left upper lobe pulmonary  nodule measuring 5 mm (series 3, image 77).  Nodule #3: Right lower lobe subpleural nodule measuring 1.1 cm (series 3, image 110) (SUV max 1.4).   Liver has a normal contour. Multifocal hypodense liver lesions without significant metabolic activity, likely cysts. Gallbladder,  pancreas, and bilateral adrenal glands are normal in appearance. Punctate splenic calcifications, likely sequela of granulomatous  disease.   Bilateral kidneys are normal in size without evidence of hydronephrosis. Right sided nephrolithiasis.   Visualized stomach, small bowel, large bowel is normal in caliber. No evidence of focal wall thickening.   Visualized abdominal aorta is normal in caliber. No suspicious osseous lesions.  Recent Labs: 04/24/2019: Hemoglobin 14.3; Magnesium 1.9; Platelets 233; TSH 2.319 05/27/2019: BUN 18; Creatinine, Ser 0.73; Potassium 4.9; Sodium 137  Recent Lipid Panel    Component Value Date/Time   CHOL 123 01/10/2019 1027   TRIG 66 01/10/2019 1027   HDL 52 01/10/2019 1027   CHOLHDL 2.4 01/10/2019 1027   LDLCALC 57 01/10/2019 1027    Physical Exam:    VS:  BP 140/78   Pulse 78   Temp (!) 97.2 F (36.2 C)   Ht 5\' 10"  (1.778 m)   Wt 137 lb (62.1 kg)   SpO2 98%   BMI 19.66 kg/m     Wt Readings from Last 3  Encounters:  05/27/19 137 lb (62.1 kg)  05/07/19 132 lb 3.2 oz (60 kg)  05/03/19 138 lb 12.8 oz (63 kg)     GEN:  Well nourished, well developed in no acute distress HEENT: Normal NECK: No JVD LYMPHATICS: No lymphadenopathy CARDIAC: RRR, no murmurs, rubs, gallops RESPIRATORY:  Clear to auscultation without rales, wheezing or rhonchi  ABDOMEN: Soft, non-tender, non-distended MUSCULOSKELETAL:  No edema SKIN: Warm and dry NEUROLOGIC:  Alert and oriented x 3 PSYCHIATRIC:  Normal affect   ASSESSMENT:    1. Chronic combined systolic (congestive) and diastolic (congestive) heart failure ([place])   2. Second degree Mobitz II AV block   3. Pulmonary nodules   4. Hyperlipidemia, unspecified hyperlipidemia type    PLAN:     Chronic combined systolic and diastolic heart failure: Normal coronary arteries on cath.  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  No evidence of active myocardial inflammation on cardiac PET at [facility] on 04/01/2019.  Status post CRT-D placement by Dr. Curt Bears on 05/06/2019 - Continue lisinopril 10 mg daily.  Will plan to transition to [facility] - Continue carvedilol 3.125 mg twice daily - Start spironolactone 12.5 mg daily.  Check BMP - Follows with EP for ICD management   Second-degree Mobitz 2 AV block: Presented with syncope.  Zio patch confirmed frequent episodes of second-degree Mobitz 2 AV block with symptoms of dizziness corresponding to periods of Mobitz 2. -Seen by Dr. Curt Bears, given EF 35% and LBBB, s/p CRT-D on 05/06/19  Hyperlipidemia: On rosuvastatin 10 mg daily.  LDL 57 on 01/10/2019  Type 2 diabetes: Well-controlled, reports A1c 6.4%.  On Metformin  Pulmonary nodules: Scattered bilateral nodules noted on PET/CT at [facility], recommended chest CT follow-up in 3 to 6 months  RTC in 2 months.  Follow-up in pharmacy clinic in 2 weeks for heart failure medication titration    Medication Adjustments/Labs and Tests  Ordered: Current medicines are reviewed at length with the patient today.  Concerns regarding medicines are outlined above.  Orders Placed This Encounter  Procedures  . CT Chest Wo Contrast  . Basic metabolic panel  . EKG 12-Lead  Meds ordered this encounter  Medications  . spironolactone (ALDACTONE) 25 MG tablet    Sig: Take 0.5 tablets (12.5 mg total) by mouth daily.    Dispense:  45 tablet    Refill:  3    Patient Instructions  Medication Instructions:  START spironolactone 12.5 mg daily  *If you need a refill on your cardiac medications before your next appointment, please call your pharmacy*   Lab Work: Today (BMET)  If you have labs (blood work) drawn today and your tests are completely normal, you will receive your results only by: Marland Kitchen MyChart Message (if you have MyChart) OR . A paper copy in the mail If you have any lab test that is abnormal or we need to change your treatment, we will call you to review the results.   Testing/Procedures: CT chest W/O contrast in 2 MONTHS (May) Non-Cardiac CT scanning, (CAT scanning), is a noninvasive, special x-ray that produces cross-sectional images of the body using x-rays and a computer. CT scans help physicians diagnose and treat medical conditions. For some CT exams, a contrast material is used to enhance visibility in the area of the body being studied. CT scans provide greater clarity and reveal more details than regular x-ray exams.  Follow-Up: At [facility], you and your health needs are our priority.  As part of our continuing mission to provide you with exceptional heart care, we have created designated Provider Care Teams.  These Care Teams include your primary Cardiologist (physician) and Advanced Practice Providers (APPs -  Physician Assistants and Nurse Practitioners) who all work together to provide you with the care you need, when you need it.  We recommend signing up for the patient portal called "MyChart".  Sign  up information is provided on this After Visit Summary.  MyChart is used to connect with patients for Virtual Visits (Telemedicine).  Patients are able to view lab/test results, encounter notes, upcoming appointments, etc.  Non-urgent messages can be sent to your provider as well.   To learn more about what you can do with MyChart, go to [email or web address].    Your next appointment:   2 month(s)  The format for your next appointment:   In Person  Provider:   Oswaldo Milian, MD   Other Instructions Follow up in 2 weeks with pharmacist (medication titration)     Signed, Donato Heinz, MD  06/02/2019 10:08 PM    [place]

## 2019-05-26 DIAGNOSIS — E559 Vitamin D deficiency, unspecified: Secondary | ICD-10-CM | POA: Diagnosis not present

## 2019-05-26 DIAGNOSIS — I5021 Acute systolic (congestive) heart failure: Secondary | ICD-10-CM | POA: Diagnosis not present

## 2019-05-26 DIAGNOSIS — G589 Mononeuropathy, unspecified: Secondary | ICD-10-CM | POA: Diagnosis not present

## 2019-05-26 DIAGNOSIS — I429 Cardiomyopathy, unspecified: Secondary | ICD-10-CM | POA: Diagnosis not present

## 2019-05-26 DIAGNOSIS — E782 Mixed hyperlipidemia: Secondary | ICD-10-CM | POA: Diagnosis not present

## 2019-05-26 DIAGNOSIS — E109 Type 1 diabetes mellitus without complications: Secondary | ICD-10-CM | POA: Diagnosis not present

## 2019-05-26 DIAGNOSIS — E119 Type 2 diabetes mellitus without complications: Secondary | ICD-10-CM | POA: Diagnosis not present

## 2019-05-26 DIAGNOSIS — R9431 Abnormal electrocardiogram [ECG] [EKG]: Secondary | ICD-10-CM | POA: Diagnosis not present

## 2019-05-26 DIAGNOSIS — I441 Atrioventricular block, second degree: Secondary | ICD-10-CM | POA: Diagnosis not present

## 2019-05-26 DIAGNOSIS — R55 Syncope and collapse: Secondary | ICD-10-CM | POA: Diagnosis not present

## 2019-05-26 DIAGNOSIS — I10 Essential (primary) hypertension: Secondary | ICD-10-CM | POA: Diagnosis not present

## 2019-05-26 DIAGNOSIS — I253 Aneurysm of heart: Secondary | ICD-10-CM | POA: Diagnosis not present

## 2019-05-27 ENCOUNTER — Encounter: Payer: Self-pay | Admitting: Cardiology

## 2019-05-27 ENCOUNTER — Ambulatory Visit (INDEPENDENT_AMBULATORY_CARE_PROVIDER_SITE_OTHER): Payer: Medicare Other | Admitting: Cardiology

## 2019-05-27 ENCOUNTER — Other Ambulatory Visit: Payer: Self-pay

## 2019-05-27 VITALS — BP 140/78 | HR 78 | Temp 97.2°F | Ht 70.0 in | Wt 137.0 lb

## 2019-05-27 DIAGNOSIS — R918 Other nonspecific abnormal finding of lung field: Secondary | ICD-10-CM

## 2019-05-27 DIAGNOSIS — I441 Atrioventricular block, second degree: Secondary | ICD-10-CM | POA: Diagnosis not present

## 2019-05-27 DIAGNOSIS — E785 Hyperlipidemia, unspecified: Secondary | ICD-10-CM

## 2019-05-27 DIAGNOSIS — I5042 Chronic combined systolic (congestive) and diastolic (congestive) heart failure: Secondary | ICD-10-CM

## 2019-05-27 DIAGNOSIS — I5021 Acute systolic (congestive) heart failure: Secondary | ICD-10-CM | POA: Diagnosis not present

## 2019-05-27 LAB — BASIC METABOLIC PANEL
BUN/Creatinine Ratio: 25 — ABNORMAL HIGH (ref 10–24)
BUN: 18 mg/dL (ref 8–27)
CO2: 25 mmol/L (ref 20–29)
Calcium: 9.9 mg/dL (ref 8.6–10.2)
Chloride: 98 mmol/L (ref 96–106)
Creatinine, Ser: 0.73 mg/dL — ABNORMAL LOW (ref 0.76–1.27)
GFR calc Af Amer: 103 mL/min/{1.73_m2} (ref >59)
GFR calc non Af Amer: 89 mL/min/{1.73_m2} (ref >59)
Glucose: 129 mg/dL — ABNORMAL HIGH (ref 65–99)
Potassium: 4.9 mmol/L (ref 3.5–5.2)
Sodium: 137 mmol/L (ref 134–144)

## 2019-05-27 MED ORDER — SPIRONOLACTONE 25 MG PO TABS: 12.5000 mg | ORAL_TABLET | Freq: Every day | ORAL | 3 refills | Status: DC

## 2019-05-27 NOTE — Patient Instructions (Signed)
Medication Instructions:  START spironolactone 12.5 mg daily  *If you need a refill on your cardiac medications before your next appointment, please call your pharmacy*   Lab Work: Today (BMET)  If you have labs (blood work) drawn today and your tests are completely normal, you will receive your results only by: Marland Kitchen MyChart Message (if you have MyChart) OR . A paper copy in the mail If you have any lab test that is abnormal or we need to change your treatment, we will call you to review the results.   Testing/Procedures: CT chest W/O contrast in 2 MONTHS (May) Non-Cardiac CT scanning, (CAT scanning), is a noninvasive, special x-ray that produces cross-sectional images of the body using x-rays and a computer. CT scans help physicians diagnose and treat medical conditions. For some CT exams, a contrast material is used to enhance visibility in the area of the body being studied. CT scans provide greater clarity and reveal more details than regular x-ray exams.  Follow-Up: At [facility], you and your health needs are our priority.  As part of our continuing mission to provide you with exceptional heart care, we have created designated Provider Care Teams.  These Care Teams include your primary Cardiologist (physician) and Advanced Practice Providers (APPs -  Physician Assistants and Nurse Practitioners) who all work together to provide you with the care you need, when you need it.  We recommend signing up for the patient portal called "MyChart".  Sign up information is provided on this After Visit Summary.  MyChart is used to connect with patients for Virtual Visits (Telemedicine).  Patients are able to view lab/test results, encounter notes, upcoming appointments, etc.  Non-urgent messages can be sent to your provider as well.   To learn more about what you can do with MyChart, go to [email or web address].    Your next appointment:   2 month(s)  The format for your next appointment:    In Person  Provider:   Oswaldo Milian, MD   Other Instructions Follow up in 2 weeks with pharmacist (medication titration)

## 2019-05-30 DIAGNOSIS — Z0001 Encounter for general adult medical examination with abnormal findings: Secondary | ICD-10-CM | POA: Diagnosis not present

## 2019-05-30 DIAGNOSIS — I429 Cardiomyopathy, unspecified: Secondary | ICD-10-CM | POA: Diagnosis not present

## 2019-05-30 DIAGNOSIS — E559 Vitamin D deficiency, unspecified: Secondary | ICD-10-CM | POA: Diagnosis not present

## 2019-05-30 DIAGNOSIS — I441 Atrioventricular block, second degree: Secondary | ICD-10-CM | POA: Diagnosis not present

## 2019-05-30 DIAGNOSIS — E782 Mixed hyperlipidemia: Secondary | ICD-10-CM | POA: Diagnosis not present

## 2019-05-30 DIAGNOSIS — I5021 Acute systolic (congestive) heart failure: Secondary | ICD-10-CM | POA: Diagnosis not present

## 2019-05-30 DIAGNOSIS — E119 Type 2 diabetes mellitus without complications: Secondary | ICD-10-CM | POA: Diagnosis not present

## 2019-05-30 DIAGNOSIS — I253 Aneurysm of heart: Secondary | ICD-10-CM | POA: Diagnosis not present

## 2019-06-01 ENCOUNTER — Other Ambulatory Visit: Payer: Self-pay

## 2019-06-01 DIAGNOSIS — Z79899 Other long term (current) drug therapy: Secondary | ICD-10-CM

## 2019-06-09 DIAGNOSIS — L570 Actinic keratosis: Secondary | ICD-10-CM | POA: Diagnosis not present

## 2019-06-09 DIAGNOSIS — Z85828 Personal history of other malignant neoplasm of skin: Secondary | ICD-10-CM | POA: Diagnosis not present

## 2019-06-09 DIAGNOSIS — D485 Neoplasm of uncertain behavior of skin: Secondary | ICD-10-CM | POA: Diagnosis not present

## 2019-06-09 DIAGNOSIS — L298 Other pruritus: Secondary | ICD-10-CM | POA: Diagnosis not present

## 2019-06-14 ENCOUNTER — Other Ambulatory Visit: Payer: Self-pay

## 2019-06-14 ENCOUNTER — Ambulatory Visit (INDEPENDENT_AMBULATORY_CARE_PROVIDER_SITE_OTHER): Payer: Medicare Other | Admitting: Pharmacist

## 2019-06-14 VITALS — BP 134/70 | HR 74 | Ht 70.0 in | Wt 137.8 lb

## 2019-06-14 DIAGNOSIS — Z79899 Other long term (current) drug therapy: Secondary | ICD-10-CM | POA: Diagnosis not present

## 2019-06-14 DIAGNOSIS — I255 Ischemic cardiomyopathy: Secondary | ICD-10-CM

## 2019-06-14 NOTE — Progress Notes (Signed)
Patient ID: Richard Martin                 DOB: 22-Sep-1940                      MRN: [id number]     HPI: Richard Martin is a 79 y.o. male referred by Dr. Gardiner Rhyme to pharmacist clinic for medication titration. PMH includes HF with EF 35%, diabetes, 2nd degree AV block, and hypertension. Patient received ICD on 05/06/2019 per DR Camnitz and carvedilol low dose was initiated. He report his energy is coming back and is able to do more around the house. Had some fatigue after initiating carvedilol but feel better now.  He forgot to repeat BMET after 1 week of stating spironolactone therapy and we don't have home BP records to review either.   Current HTN meds:  Carvedilol 3.125mg  twice daily Lisinopril 10mg  dail Spironolactone 12.5mg  daily  BP goal: 130/80  Family History:   Social History: former smoker , some beer in the past,  Rare glass of wine  Diet: balanced diet, avoid sweets d/t diabetes  Exercise: activities of daily living, work in the yard yerterday  Home BP readings: no records provided; Pulse 60 at rest, BP 135/70 per patient report  Wt Readings from Last 3 Encounters:  06/14/19 137 lb 12.8 oz (62.5 kg)  05/27/19 137 lb (62.1 kg)  05/07/19 132 lb 3.2 oz (60 kg)   BP Readings from Last 3 Encounters:  06/14/19 134/70  05/27/19 140/78  05/07/19 (!) 141/70   Pulse Readings from Last 3 Encounters:  06/14/19 74  05/27/19 78  05/07/19 81    Renal function: Estimated Creatinine Clearance: 67.3 mL/min (A) (by C-G formula based on SCr of 0.65 mg/dL (L)).  Past Medical History:  Diagnosis Date  . Cardiac resynchronization therapy defibrillator (CRT-D) in place    a. 05/2019 s/p SJM [facility] HF CRT-D [id number] (ser# [id number]).  . Chronic combined systolic (congestive) and diastolic (congestive) heart failure ([place])    a. 01/2019 Echo: EF 40-45%, focal basal inf aneurysmal region. Gr2 DD. Nl RV fxn. Mildly dil RA. Mod TR. Triv PR; b. 02/2019 cMRI: EF 35%.  . Diabetes  mellitus without complication ([place])   . LBBB (left bundle branch block)   . NICM (nonischemic cardiomyopathy) ([place])    a. 01/2019 Echo: EF 40-45%; b. 01/2019 Cath: nl cors. EF 35-45%; c. 02/2019 cMRI: Basal inf and basal infsept midwall LGE. EF 35%; d. 05/2019 s/p SJM [facility] HF CRT-D [id number] (ser# [id number]).    Current Outpatient Medications on File Prior to Visit  Medication Sig Dispense Refill  . acetaminophen (TYLENOL) 500 MG tablet Take 500 mg by mouth daily as needed for moderate pain or headache.    Marland Kitchen aspirin EC 81 MG tablet Take 81 mg by mouth daily.     . carvedilol (COREG) 3.125 MG tablet Take 1 tablet (3.125 mg total) by mouth 2 (two) times daily with a meal. 60 tablet 6  . cholecalciferol (VITAMIN D3) 25 MCG (1000 UT) tablet Take 1,000 Units by mouth daily.    Marland Kitchen lisinopril (ZESTRIL) 10 MG tablet Take 1 tablet (10 mg total) by mouth daily. 90 tablet 3  . metFORMIN (GLUCOPHAGE) 500 MG tablet Take 1 tablet (500 mg total) by mouth 2 (two) times daily.    . rosuvastatin (CRESTOR) 10 MG tablet Take 10 mg by mouth daily.     Marland Kitchen spironolactone (ALDACTONE) 25 MG tablet Take 0.5 tablets (12.5  mg total) by mouth daily. 45 tablet 3  . zinc gluconate 50 MG tablet Take 50 mg by mouth daily.     No current facility-administered medications on file prior to visit.    Allergies  Allergen Reactions  . Crab [Shellfish Allergy] Swelling    Blood pressure 134/70, pulse 74, height 5\' 10"  (1.778 m), weight 137 lb 12.8 oz (62.5 kg), SpO2 99 %.  Ischemic cardiomyopathy BP remains appropriate for further lisinopril and spironolactone titration but patient forgot to repeat BMET prior to this visit. Carvedilol was initiated by Dr Curt Bears after ICD implanted and will follow up for further titration in 6 weeks. I spent some time explaining purpose of pharmacological therapy and need for regular blood work until all medication titrated to goal.  Will repeat BMET today and increase spironolactone to 25mg   daily if SCR and K remains appropriate. Otherwise, we can increase lisinopril to 20mg  daily and consider change to [facility] if able to tolerate ACEi target dose.     Vivian Neuwirth Rodriguez-Guzman PharmD, BCPS, CPP [facility] [place] [id number] 06/15/2019 8:58 AM  Addendum  06/15/2019: BUN 20, SCr 0.65, Na 143, K 5.0, Cl 103  Will increase lisinopril to 20mg  daily and continue spironolactone at 12.5mg  d/t K at 5.0 already.  Addie Alonge Rodriguez-Guzman PharmD, BCPS, [place] [address] [place] [address] 06/15/2019 9:00 AM

## 2019-06-14 NOTE — Patient Instructions (Addendum)
Return for a follow up appointment in 4 weeks with Dr Gardiner Rhyme  Go to the lab in [place] your blood pressure at home daily (if able) and keep record of the readings.  Take your BP meds as follows: * PLAN to increase spironolactone to 25mg  daily if renal function remains stable*  Bring all of your meds, your BP cuff and your record of home blood pressures to your next appointment.  Exercise as you're able, try to walk approximately 30 minutes per day.  Keep salt intake to a minimum, especially watch canned and prepared boxed foods.  Eat more fresh fruits and vegetables and fewer canned items.  Avoid eating in fast food restaurants.    HOW TO TAKE YOUR BLOOD PRESSURE: . Rest 5 minutes before taking your blood pressure. .  Don't smoke or drink caffeinated beverages for at least 30 minutes before. . Take your blood pressure before (not after) you eat. . Sit comfortably with your back supported and both feet on the floor (don't cross your legs). . Elevate your arm to heart level on a table or a desk. . Use the proper sized cuff. It should fit smoothly and snugly around your bare upper arm. There should be enough room to slip a fingertip under the cuff. The bottom edge of the cuff should be 1 inch above the crease of the elbow. . Ideally, take 3 measurements at one sitting and record the average.

## 2019-06-15 ENCOUNTER — Telehealth: Payer: Self-pay | Admitting: Pharmacist

## 2019-06-15 LAB — BASIC METABOLIC PANEL
BUN/Creatinine Ratio: 31 — ABNORMAL HIGH (ref 10–24)
BUN: 20 mg/dL (ref 8–27)
CO2: 26 mmol/L (ref 20–29)
Calcium: 10.1 mg/dL (ref 8.6–10.2)
Chloride: 103 mmol/L (ref 96–106)
Creatinine, Ser: 0.65 mg/dL — ABNORMAL LOW (ref 0.76–1.27)
GFR calc Af Amer: 108 mL/min/{1.73_m2} (ref >59)
GFR calc non Af Amer: 93 mL/min/{1.73_m2} (ref >59)
Glucose: 101 mg/dL — ABNORMAL HIGH (ref 65–99)
Potassium: 5 mmol/L (ref 3.5–5.2)
Sodium: 143 mmol/L (ref 134–144)

## 2019-06-15 MED ORDER — LISINOPRIL 20 MG PO TABS: 20.0000 mg | ORAL_TABLET | Freq: Every day | ORAL | 1 refills | Status: DC

## 2019-06-15 NOTE — Assessment & Plan Note (Signed)
BP remains appropriate for further lisinopril and spironolactone titration but patient forgot to repeat BMET prior to this visit. Carvedilol was initiated by Dr Curt Bears after ICD implanted and will follow up for further titration in 6 weeks. I spent some time explaining purpose of pharmacological therapy and need for regular blood work until all medication titrated to goal.  Will repeat BMET today and increase spironolactone to 25mg  daily if SCR and K remains appropriate. Otherwise, we can increase lisinopril to 20mg  daily and consider change to [facility] if able to tolerate ACEi target dose.

## 2019-06-15 NOTE — Telephone Encounter (Signed)
BMET stable but K already at 5.o. Will increase lisinopril to 20mg  daily and keep spironolactone at 12.5mg  every day.  F/U with Dr Gardiner Rhyme already schedule in 4 weeks.

## 2019-07-06 ENCOUNTER — Ambulatory Visit (HOSPITAL_COMMUNITY)
Admission: RE | Admit: 2019-07-06 | Discharge: 2019-07-06 | Disposition: A | Discharge status: Home / Self Care | Payer: Medicare Other | Source: Ambulatory Visit | Attending: Cardiology | Admitting: Cardiology

## 2019-07-06 ENCOUNTER — Other Ambulatory Visit: Payer: Self-pay

## 2019-07-06 DIAGNOSIS — R918 Other nonspecific abnormal finding of lung field: Secondary | ICD-10-CM | POA: Insufficient documentation

## 2019-07-06 DIAGNOSIS — J432 Centrilobular emphysema: Secondary | ICD-10-CM | POA: Diagnosis not present

## 2019-07-08 ENCOUNTER — Other Ambulatory Visit: Payer: Self-pay | Admitting: *Deleted

## 2019-07-08 DIAGNOSIS — R918 Other nonspecific abnormal finding of lung field: Secondary | ICD-10-CM

## 2019-07-08 DIAGNOSIS — J439 Emphysema, unspecified: Secondary | ICD-10-CM

## 2019-07-09 NOTE — Progress Notes (Signed)
Cardiology Office Note:    Date:  07/12/2019   ID:  Richard Martin, DOB Nov 28, 1940, MRN [id number]  PCP:  Celene Squibb, MD  Cardiologist:  No primary care provider on file.  Electrophysiologist:  None   Referring MD: Celene Squibb, MD   Chief Complaint  Patient presents with  . Congestive Heart Failure    History of Present Illness:    Richard Martin is a 79 y.o. male with a hx of diabetes, newly diagnosed systolic heart failure and second-degree Mobitz 2 AV block presents for follow-up.  He was initially seen on 12/31/2018 after being referred by Dr. Nevada Crane for evaluation of EKG abnormalities and syncopal episode.  He had a episode of syncope where he was walking to his kitchen and had sudden loss of consciousness.  He was seen in clinic on 12/31/2018, and rhythm strip demonstrated intermittent 2:1 AV block, with a wide QRS.  He was referred to EP for evaluation for pacemaker, and an echocardiogram was ordered.  TTE was done on 01/04/2019, which showed EF 40 to 45%, with aneurysmal basal inferior segment.  TTE was also notable for grade 2 diastolic dysfunction, normal RV function, no significant valvular disease.  Given new systolic dysfunction, cardiac catheterization was done on 01/13/2019, which showed normal coronary arteries.  Cardiac MRI was done on 02/11/2019, which showed EF 35%, basal inferior aneurysm, transmural basal inferior LGE, basal inferoseptal mid wall LGE.  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  Zio patch x14 days was completed, which showed frequent second-degree Mobitz 2 AV block, with symptoms of dizziness corresponding to Mobitz 2 AV block.  He underwent cardiac PET at [facility] on 04/01/2019, which showed no active myocardial inflammation.  He underwent BiV ICD placement by Dr. Curt Bears on 05/06/2019.  Since CRT-D placed, reports lightheadedness/syncope has resolved and dyspnea/fatigue significantly improved.  Since last clinic visit, he reports that he has been doing well.   States that he walks for 30 to 35 minutes every day.  Denies any chest pain or dyspnea.  Denies any lightheadedness, syncope, or palpitations.  Brought his BP log with him, has been 120s to 140s over 60s to 70s.   Past Medical History:  Diagnosis Date  . Cardiac resynchronization therapy defibrillator (CRT-D) in place    a. 05/2019 s/p [facility] HF CRT-D [id number] (ser# [id number]).  . Chronic combined systolic (congestive) and diastolic (congestive) heart failure ([place])    a. 01/2019 Echo: EF 40-45%, focal basal inf aneurysmal region. Gr2 DD. Nl RV fxn. Mildly dil RA. Mod TR. Triv PR; b. 02/2019 cMRI: EF 35%.  . Diabetes mellitus without complication ([place])   . LBBB (left bundle branch block)   . NICM (nonischemic cardiomyopathy) ([place])    a. 01/2019 Echo: EF 40-45%; b. 01/2019 Cath: nl cors. EF 35-45%; c. 02/2019 cMRI: Basal inf and basal infsept midwall LGE. EF 35%; d. 05/2019 s/p [facility] HF CRT-D [id number] (ser# [id number]).    Past Surgical History:  Procedure Laterality Date  . BIV ICD INSERTION CRT-D N/A 05/06/2019   Procedure: BIV ICD INSERTION CRT-D;  Surgeon: Constance Haw, MD;  Location: [place] [facility];  Service: Cardiovascular;  Laterality: N/A;  . LEFT HEART CATH AND CORONARY ANGIOGRAPHY N/A 01/13/2019   Procedure: LEFT HEART CATH AND CORONARY ANGIOGRAPHY;  Surgeon: Martinique, Peter M, MD;  Location: [place] [facility];  Service: Cardiovascular;  Laterality: N/A;    Current Medications: Current Meds  Medication Sig  . acetaminophen (TYLENOL) 500 MG tablet  Take 500 mg by mouth daily as needed for moderate pain or headache.  Marland Kitchen aspirin EC 81 MG tablet Take 81 mg by mouth daily.   . carvedilol (COREG) 3.125 MG tablet Take 1 tablet (3.125 mg total) by mouth 2 (two) times daily with a meal.  . cholecalciferol (VITAMIN D3) 25 MCG (1000 UT) tablet Take 1,000 Units by mouth daily.  Marland Kitchen lisinopril (ZESTRIL) 20 MG tablet Take 1 tablet (20 mg total) by mouth daily.  . metFORMIN  (GLUCOPHAGE) 500 MG tablet Take 1 tablet (500 mg total) by mouth 2 (two) times daily.  . rosuvastatin (CRESTOR) 10 MG tablet Take 10 mg by mouth daily.   Marland Kitchen spironolactone (ALDACTONE) 25 MG tablet Take 0.5 tablets (12.5 mg total) by mouth daily.  Marland Kitchen zinc gluconate 50 MG tablet Take 50 mg by mouth daily.     Allergies:   Otho Darner allergy]   Social History   Socioeconomic History  . Marital status: Married    Spouse name: Not on file  . Number of children: Not on file  . Years of education: Not on file  . Highest education level: Not on file  Occupational History  . Not on file  Tobacco Use  . Smoking status: Former Smoker    Types: Cigarettes  . Smokeless tobacco: Never Used  Substance and Sexual Activity  . Alcohol use: No  . Drug use: No  . Sexual activity: Not on file  Other Topics Concern  . Not on file  Social History Narrative  . Not on file   Social Determinants of Health   Financial Resource Strain:   . Difficulty of Paying Living Expenses:   Food Insecurity:   . Worried About Charity fundraiser in the Last Year:   . Arboriculturist in the Last Year:   Transportation Needs:   . Film/video editor (Medical):   Marland Kitchen Lack of Transportation (Non-Medical):   Physical Activity:   . Days of Exercise per Week:   . Minutes of Exercise per Session:   Stress:   . Feeling of Stress :   Social Connections:   . Frequency of Communication with Friends and Family:   . Frequency of Social Gatherings with Friends and Family:   . Attends Religious Services:   . Active Member of Clubs or Organizations:   . Attends Archivist Meetings:   Marland Kitchen Marital Status:      Family History: Father died of MI at age 56  ROS:   Please see the history of present illness.    All other systems reviewed and are negative.  EKGs/Labs/Other Studies Reviewed:    The following studies were reviewed today:   EKG:  EKG is not ordered today.  The ekg ordered most recently  shows sinus rhythm, BiV paced   TTE 01/04/19:  1. Left ventricular ejection fraction, by visual estimation, is 40 to 45%. The left ventricle has normal function. There is no left ventricular hypertrophy.  2. Basal inferior segment is abnormal.  3. Left ventricular diastolic parameters are consistent with Grade II diastolic dysfunction (pseudonormalization).  4. Global right ventricle has normal systolic function.The right ventricular size is mildly enlarged. No increase in right ventricular wall thickness.  5. Left atrial size was normal.  6. Right atrial size was mildly dilated.  7. The mitral valve is normal in structure. No evidence of mitral valve regurgitation. No evidence of mitral stenosis.  8. The tricuspid valve is normal in structure.  Tricuspid valve regurgitation moderate.  9. The aortic valve is normal in structure. Aortic valve regurgitation is not visualized. No evidence of aortic valve sclerosis or stenosis. 10. The pulmonic valve was normal in structure. Pulmonic valve regurgitation is trivial. 11. Mildly elevated pulmonary artery systolic pressure. 12. The inferior vena cava is normal in size with greater than 50% respiratory variability, suggesting right atrial pressure of 3 mmHg. 13. Focal basal inferior aneursymal region/infarct. 14. There is redundancy of the interatrial septum.  LHC 01/13/19:  There is moderate left ventricular systolic dysfunction.  LV end diastolic pressure is normal.  The left ventricular ejection fraction is 35-45% by visual estimate.   1. Normal coronary anatomy 2. Moderate LV dysfunction 3. inferobasal aneurysm 4. Normal LVEDP  CMR 02/11/19: 1. 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 While transmural LGE suggests ischemic etiology, it is not in a coronary distribution and patient had normal coronaries on recent cath. 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 2. Mild LV dilatation with moderate systolic dysfunction (EF [id number]). Basal inferior aneurysm 3.  Normal RV size and systolic function (EF [id number])  Cardiac monitor 02/13/19:  Frequent second degree Mobitz II AV block. Symptoms of dizziness corresponded to Mobitz II AV block   14 days of data recorded on Zio monitor. Patient had a min HR of 29 bpm, max HR of 123 bpm, and avg HR of 69 bpm. Predominant underlying rhythm was Sinus Rhythm.  No VT, SVT, atrial fibrillation, or pauses noted. Isolated atrial and ventricular ectopy was rare (<1%).  8523 episodes of second degree Mobitz II AV block.  Patient triggered events, with symptoms reported of dizziness, corresponded to Mobitz II AV block.   Cardiac PET 04/01/19:  Scattered bilateral pulmonary nodules, largest in the right lower lobe measuring up to 1.1 cm with minimal metabolic activity,  which may represent atelectasis; recommend follow up chest CT in 3-6 months to assess for resolution.  FINAL COMMENTS  Rb-82 Cardiac PET/CT Myocardial Perfusion Imaging Study:   Abnormal rest perfusion in 4 out of 17 segments of the myocardium in a non "vascular" territory. This is consistent with  "scar/fibrosis" of the myocardium due to prior inflamation/infiltrative disease. It is not coronary artery disease related.   Rb-82 Cardiac PET/CT Functonal Imaging Study:   Sever decreased in left ventricular function. Dilated left ventricle. These findings are compatible with a nonischemic  cardiomyopathy.   F-18 FDG Cardiac PET/CT Myocardial Metabolic Study:   No evidence of active inflamation of the myocardium. No evidence of active inflamatory process of the myocardium to suggest  active sarcoid or active myocarditis.   Limited F-18 FDG PET/CT of the Chest and Abdomen:   Note that evaluation is somewhat limited due to lack of IV contrast and slice thickness, within this limitation:    The thyroid is normal in appearance.   No mediastinal, hilar, or axillary lymphadenopathy. There are tiny mediastinal lymph nodes which do not meet size criteria for  pathologic enlargement. There are severe coronary artery and moderate thoracic aortic calcifications. No pericardial effusion.  The esophagus is normal in appearance.   No pleural effusion. No pneumothorax. Severe bilateral centrilobular and paraseptal emphysema. There are basal predominant  subpleural reticulations. There are scattered bilateral pulmonary nodules measuring up to 1.1 cm, for example:   Nodule #1: Right upper lobe nodule measuring 6 mm (series 3, image 80).  Nodule #2: Left  upper lobe pulmonary nodule measuring 5 mm (series 3, image 77).  Nodule #3: Right lower lobe subpleural nodule measuring 1.1 cm (series 3, image 110) (SUV max 1.4).   Liver has a normal contour. Multifocal hypodense liver lesions without significant metabolic activity, likely cysts. Gallbladder,  pancreas, and bilateral adrenal glands are normal in appearance. Punctate splenic calcifications, likely sequela of granulomatous  disease.   Bilateral kidneys are normal in size without evidence of hydronephrosis. Right sided nephrolithiasis.   Visualized stomach, small bowel, large bowel is normal in caliber. No evidence of focal wall thickening.   Visualized abdominal aorta is normal in caliber. No suspicious osseous lesions.  Recent Labs: 04/24/2019: Hemoglobin 14.3; Magnesium 1.9; Platelets 233; TSH 2.319 06/14/2019: BUN 20; Creatinine, Ser 0.65; Potassium 5.0; Sodium 143  Recent Lipid Panel    Component Value Date/Time   CHOL 123 01/10/2019 1027   TRIG 66 01/10/2019 1027   HDL 52 01/10/2019 1027   CHOLHDL 2.4 01/10/2019 1027   LDLCALC 57 01/10/2019 1027    Physical Exam:    VS:  BP 138/80   Pulse 82   Ht 5\' 9"  (1.753 m)   Wt 140 lb 12.8 oz (63.9 kg)   SpO2 100%   BMI 20.79 kg/m     Wt Readings from Last 3 Encounters:   07/12/19 140 lb 12.8 oz (63.9 kg)  06/14/19 137 lb 12.8 oz (62.5 kg)  05/27/19 137 lb (62.1 kg)     GEN:  Well nourished, well developed in no acute distress HEENT: Normal NECK: No JVD LYMPHATICS: No lymphadenopathy CARDIAC: RRR, no murmurs, rubs, gallops RESPIRATORY:  Clear to auscultation without rales, wheezing or rhonchi  ABDOMEN: Soft, non-tender, non-distended MUSCULOSKELETAL:  No edema SKIN: Warm and dry NEUROLOGIC:  Alert and oriented x 3 PSYCHIATRIC:  Normal affect   ASSESSMENT:    1. Chronic combined systolic (congestive) and diastolic (congestive) heart failure ([place])   2. Second degree Mobitz II AV block    PLAN:     Chronic combined systolic and diastolic heart failure: Normal coronary arteries on cath.  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  No evidence of active myocardial inflammation on cardiac PET at [facility] on 04/01/2019.  Status post CRT-D placement by Dr. Curt Bears on 05/06/2019 - Continue lisinopril 20 mg daily. - Continue carvedilol 3.125 mg twice daily - Continue spironolactone 12.5 mg daily.  Check BMP.  Continue to titrate if stable renal function/potassium - Follows with EP for ICD management   Second-degree Mobitz 2 AV block: Presented with syncope.  Zio patch confirmed frequent episodes of second-degree Mobitz 2 AV block with symptoms of dizziness corresponding to periods of Mobitz 2. -Seen by Dr. Curt Bears, given EF 35% and LBBB, s/p CRT-D on 05/06/19  Hyperlipidemia: On rosuvastatin 10 mg daily.  LDL 57 on 01/10/2019  Type 2 diabetes: Well-controlled, reports A1c 6.4%.  On Metformin  Pulmonary nodules/COPD: Scattered bilateral nodules noted on PET/CT at [facility], recommended chest CT follow-up in 3 to 6 months.  CT chest 07/07/2019 showed several small pulmonary nodules, along with moderate to severe emphysema.  Refer to pulmonology for further evaluation  RTC in 2 months    Medication Adjustments/Labs and  Tests Ordered: Current medicines are reviewed at length with the patient today.  Concerns regarding medicines are outlined above.  Orders Placed This Encounter  Procedures  . Basic metabolic panel   No orders of the defined types were placed in this encounter.   Patient  Instructions  Medication Instructions:  Your physician recommends that you continue on your current medications as directed. Please refer to the Current Medication list given to you today.  *If you need a refill on your cardiac medications before your next appointment, please call your pharmacy*   Lab Work: Today (BMET) If you have labs (blood work) drawn today and your tests are completely normal, you will receive your results only by: Marland Kitchen MyChart Message (if you have MyChart) OR . A paper copy in the mail If you have any lab test that is abnormal or we need to change your treatment, we will call you to review the results.  Follow-Up: At [facility], you and your health needs are our priority.  As part of our continuing mission to provide you with exceptional heart care, we have created designated Provider Care Teams.  These Care Teams include your primary Cardiologist (physician) and Advanced Practice Providers (APPs -  Physician Assistants and Nurse Practitioners) who all work together to provide you with the care you need, when you need it.  We recommend signing up for the patient portal called "MyChart".  Sign up information is provided on this After Visit Summary.  MyChart is used to connect with patients for Virtual Visits (Telemedicine).  Patients are able to view lab/test results, encounter notes, upcoming appointments, etc.  Non-urgent messages can be sent to your provider as well.   To learn more about what you can do with MyChart, go to [email or web address].    Your next appointment:   2 month(s)  The format for your next appointment:   In Person  Provider:   Oswaldo Milian, MD        Signed, Donato Heinz, MD  07/12/2019 11:40 PM    [place]

## 2019-07-12 ENCOUNTER — Encounter: Payer: Self-pay | Admitting: Cardiology

## 2019-07-12 ENCOUNTER — Other Ambulatory Visit: Payer: Self-pay

## 2019-07-12 ENCOUNTER — Ambulatory Visit (INDEPENDENT_AMBULATORY_CARE_PROVIDER_SITE_OTHER): Payer: Medicare Other | Admitting: Cardiology

## 2019-07-12 VITALS — BP 138/80 | HR 82 | Ht 69.0 in | Wt 140.8 lb

## 2019-07-12 DIAGNOSIS — J439 Emphysema, unspecified: Secondary | ICD-10-CM | POA: Diagnosis not present

## 2019-07-12 DIAGNOSIS — E785 Hyperlipidemia, unspecified: Secondary | ICD-10-CM | POA: Diagnosis not present

## 2019-07-12 DIAGNOSIS — R918 Other nonspecific abnormal finding of lung field: Secondary | ICD-10-CM | POA: Diagnosis not present

## 2019-07-12 DIAGNOSIS — I441 Atrioventricular block, second degree: Secondary | ICD-10-CM | POA: Diagnosis not present

## 2019-07-12 DIAGNOSIS — I5042 Chronic combined systolic (congestive) and diastolic (congestive) heart failure: Secondary | ICD-10-CM

## 2019-07-12 NOTE — Patient Instructions (Signed)
Medication Instructions:  Your physician recommends that you continue on your current medications as directed. Please refer to the Current Medication list given to you today.  *If you need a refill on your cardiac medications before your next appointment, please call your pharmacy*   Lab Work: Today (BMET) If you have labs (blood work) drawn today and your tests are completely normal, you will receive your results only by: Marland Kitchen MyChart Message (if you have MyChart) OR . A paper copy in the mail If you have any lab test that is abnormal or we need to change your treatment, we will call you to review the results.  Follow-Up: At [facility], you and your health needs are our priority.  As part of our continuing mission to provide you with exceptional heart care, we have created designated Provider Care Teams.  These Care Teams include your primary Cardiologist (physician) and Advanced Practice Providers (APPs -  Physician Assistants and Nurse Practitioners) who all work together to provide you with the care you need, when you need it.  We recommend signing up for the patient portal called "MyChart".  Sign up information is provided on this After Visit Summary.  MyChart is used to connect with patients for Virtual Visits (Telemedicine).  Patients are able to view lab/test results, encounter notes, upcoming appointments, etc.  Non-urgent messages can be sent to your provider as well.   To learn more about what you can do with MyChart, go to [email or web address].    Your next appointment:   2 month(s)  The format for your next appointment:   In Person  Provider:   Oswaldo Milian, MD

## 2019-07-13 ENCOUNTER — Other Ambulatory Visit: Payer: Self-pay | Admitting: *Deleted

## 2019-07-13 DIAGNOSIS — I5042 Chronic combined systolic (congestive) and diastolic (congestive) heart failure: Secondary | ICD-10-CM

## 2019-07-13 DIAGNOSIS — E875 Hyperkalemia: Secondary | ICD-10-CM

## 2019-07-13 LAB — BASIC METABOLIC PANEL
BUN/Creatinine Ratio: 20 (ref 10–24)
BUN: 14 mg/dL (ref 8–27)
CO2: 26 mmol/L (ref 20–29)
Calcium: 10 mg/dL (ref 8.6–10.2)
Chloride: 101 mmol/L (ref 96–106)
Creatinine, Ser: 0.71 mg/dL — ABNORMAL LOW (ref 0.76–1.27)
GFR calc Af Amer: 104 mL/min/{1.73_m2} (ref >59)
GFR calc non Af Amer: 90 mL/min/{1.73_m2} (ref >59)
Glucose: 151 mg/dL — ABNORMAL HIGH (ref 65–99)
Potassium: 5.4 mmol/L — ABNORMAL HIGH (ref 3.5–5.2)
Sodium: 142 mmol/L (ref 134–144)

## 2019-07-13 MED ORDER — CARVEDILOL 6.25 MG PO TABS: 6.2500 mg | ORAL_TABLET | Freq: Two times a day (BID) | ORAL | 3 refills | Status: DC

## 2019-07-22 ENCOUNTER — Telehealth: Payer: Self-pay | Admitting: Cardiology

## 2019-07-22 NOTE — Telephone Encounter (Signed)
New message   Patient needs a new prescription for carvedilol (COREG) 6.25 MG tablet sent to [place], [place]

## 2019-07-22 NOTE — Telephone Encounter (Signed)
Pt advised Rx was sent to [facility] 07/13/19 and will call me back if he has any difficulties picking it up.

## 2019-07-27 ENCOUNTER — Telehealth: Payer: Self-pay | Admitting: *Deleted

## 2019-07-27 NOTE — Telephone Encounter (Signed)
Pulmonology appointment schedule on 09/06/19,message was left on the voicemail and letter mailed.

## 2019-08-05 ENCOUNTER — Ambulatory Visit (INDEPENDENT_AMBULATORY_CARE_PROVIDER_SITE_OTHER): Payer: Medicare Other | Admitting: *Deleted

## 2019-08-05 ENCOUNTER — Telehealth: Payer: Self-pay

## 2019-08-05 DIAGNOSIS — I428 Other cardiomyopathies: Secondary | ICD-10-CM | POA: Diagnosis not present

## 2019-08-05 LAB — CUP PACEART REMOTE DEVICE CHECK
Battery Remaining Longevity: 88 mo
Battery Remaining Percentage: 94 %
Battery Voltage: 3.01 V
Brady Statistic AP VP Percent: 12 %
Brady Statistic AP VS Percent: 1 %
Brady Statistic AS VP Percent: 88 %
Brady Statistic AS VS Percent: 1 %
Brady Statistic RA Percent Paced: 12 %
Date Time Interrogation Session: 20210604113026
HighPow Impedance: 65 Ohm
Implantable Lead Implant Date: 20210305
Implantable Lead Implant Date: 20210305
Implantable Lead Implant Date: 20210305
Implantable Lead Location: 753858
Implantable Lead Location: 753859
Implantable Lead Location: 753860
Implantable Pulse Generator Implant Date: 20210305
Lead Channel Impedance Value: 460 Ohm
Lead Channel Impedance Value: 460 Ohm
Lead Channel Impedance Value: 960 Ohm
Lead Channel Pacing Threshold Amplitude: 0.625 V
Lead Channel Pacing Threshold Amplitude: 0.875 V
Lead Channel Pacing Threshold Amplitude: 2.125 V
Lead Channel Pacing Threshold Pulse Width: 0.5 ms
Lead Channel Pacing Threshold Pulse Width: 0.5 ms
Lead Channel Pacing Threshold Pulse Width: 0.5 ms
Lead Channel Sensing Intrinsic Amplitude: 1.7 mV
Lead Channel Sensing Intrinsic Amplitude: 12 mV
Lead Channel Setting Pacing Amplitude: 1.625
Lead Channel Setting Pacing Amplitude: 1.875
Lead Channel Setting Pacing Amplitude: 2.625
Lead Channel Setting Pacing Pulse Width: 0.5 ms
Lead Channel Setting Pacing Pulse Width: 0.5 ms
Lead Channel Setting Sensing Sensitivity: 0.5 mV
Pulse Gen Serial Number: 111018177

## 2019-08-05 NOTE — Telephone Encounter (Signed)
Spoke with patient to remind of missed remote transmission 

## 2019-08-09 ENCOUNTER — Encounter: Payer: Self-pay | Admitting: Cardiology

## 2019-08-09 ENCOUNTER — Ambulatory Visit (INDEPENDENT_AMBULATORY_CARE_PROVIDER_SITE_OTHER): Payer: Medicare Other | Admitting: Cardiology

## 2019-08-09 ENCOUNTER — Other Ambulatory Visit: Payer: Self-pay

## 2019-08-09 VITALS — BP 140/80 | HR 69 | Ht 68.0 in | Wt 139.8 lb

## 2019-08-09 DIAGNOSIS — I255 Ischemic cardiomyopathy: Secondary | ICD-10-CM

## 2019-08-09 DIAGNOSIS — I428 Other cardiomyopathies: Secondary | ICD-10-CM

## 2019-08-09 LAB — CUP PACEART INCLINIC DEVICE CHECK
Battery Remaining Longevity: 86 mo
Brady Statistic RA Percent Paced: 12 %
Brady Statistic RV Percent Paced: 99.8 %
Date Time Interrogation Session: 20210608141900
HighPow Impedance: 67.5 Ohm
Implantable Lead Implant Date: 20210305
Implantable Lead Implant Date: 20210305
Implantable Lead Implant Date: 20210305
Implantable Lead Location: 753858
Implantable Lead Location: 753859
Implantable Lead Location: 753860
Implantable Pulse Generator Implant Date: 20210305
Lead Channel Impedance Value: 1087.5 Ohm
Lead Channel Impedance Value: 487.5 Ohm
Lead Channel Impedance Value: 500 Ohm
Lead Channel Pacing Threshold Amplitude: 0.75 V
Lead Channel Pacing Threshold Amplitude: 1 V
Lead Channel Pacing Threshold Amplitude: 1.375 V
Lead Channel Pacing Threshold Pulse Width: 0.5 ms
Lead Channel Pacing Threshold Pulse Width: 0.5 ms
Lead Channel Pacing Threshold Pulse Width: 0.8 ms
Lead Channel Sensing Intrinsic Amplitude: 3.7 mV
Lead Channel Setting Pacing Amplitude: 1.75 V
Lead Channel Setting Pacing Amplitude: 1.875
Lead Channel Setting Pacing Amplitude: 2 V
Lead Channel Setting Pacing Pulse Width: 0.5 ms
Lead Channel Setting Pacing Pulse Width: 0.8 ms
Lead Channel Setting Sensing Sensitivity: 0.5 mV
Pulse Gen Serial Number: 111018177

## 2019-08-09 NOTE — Patient Instructions (Signed)
Medication Instructions:  Your physician recommends that you continue on your current medications as directed. Please refer to the Current Medication list given to you today.  *If you need a refill on your cardiac medications before your next appointment, please call your pharmacy*   Lab Work: None ordered If you have labs (blood work) drawn today and your tests are completely normal, you will receive your results only by: Marland Kitchen MyChart Message (if you have MyChart) OR . A paper copy in the mail If you have any lab test that is abnormal or we need to change your treatment, we will call you to review the results.   Testing/Procedures: None ordered   Follow-Up: Remote monitoring is used to monitor your Pacemaker of ICD from home. This monitoring reduces the number of office visits required to check your device to one time per year. It allows [place] to keep an eye on the functioning of your device to ensure it is working properly. You are scheduled for a device check from home on 11/04/2019. You may send your transmission at any time that day. If you have a wireless device, the transmission will be sent automatically. After your physician reviews your transmission, you will receive a postcard with your next transmission date.  At [facility], you and your health needs are our priority.  As part of our continuing mission to provide you with exceptional heart care, we have created designated Provider Care Teams.  These Care Teams include your primary Cardiologist (physician) and Advanced Practice Providers (APPs -  Physician Assistants and Nurse Practitioners) who all work together to provide you with the care you need, when you need it.  We recommend signing up for the patient portal called "MyChart".  Sign up information is provided on this After Visit Summary.  MyChart is used to connect with patients for Virtual Visits (Telemedicine).  Patients are able to view lab/test results, encounter notes,  upcoming appointments, etc.  Non-urgent messages can be sent to your provider as well.   To learn more about what you can do with MyChart, go to [email or web address].    Your next appointment:   9 month(s)  The format for your next appointment:   In Person  Provider:   Allegra Lai, MD   Thank you for choosing [place]!!   Trinidad Curet, RN [phone]    Other Instructions

## 2019-08-09 NOTE — Progress Notes (Signed)
Electrophysiology Office Note   Date:  08/09/2019   ID:  Israel, Werts 03-17-1940, MRN [id number]  PCP:  Celene Squibb, MD  Cardiologist: Gardiner Rhyme Primary Electrophysiologist:  Ikea Demicco [name], MD    Chief Complaint: bradycardia   History of Present Illness: Richard Martin is a 79 y.o. male who is being seen today for the evaluation of 2:1 AV block at the request of Celene Squibb, MD. Presenting today for electrophysiology evaluation.  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  He had a syncopal episode when he was walking in his kitchen.  He had sudden loss of consciousness.  He was only unconscious for a few seconds.  He checks his blood pressure which was elevated after the episode.  He had no symptoms prior to losing consciousness.  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  TTE showed an ejection fraction of 40 to 45% with an aneurysmal basal inferior segment.  Left heart catheterization showed no significant coronary artery disease.  He is now status post [place] CRT-D implanted 05/06/2019.  Today, denies symptoms of palpitations, chest pain, shortness of breath, orthopnea, PND, lower extremity edema, claudication, dizziness, presyncope, syncope, bleeding, or neurologic sequela. The patient is tolerating medications without difficulties. Since his device was implanted, he is felt much improved with increased energy. He has no chest pain or shortness of breath. He continues to exercise.  Past Medical History:  Diagnosis Date  . Cardiac resynchronization therapy defibrillator (CRT-D) in place    a. 05/2019 s/p SJM [facility] HF CRT-D [id number] (ser# [id number]).  . Chronic combined systolic (congestive) and diastolic (congestive) heart failure ([place])    a. 01/2019 Echo: EF 40-45%, focal basal inf aneurysmal region. Gr2 DD. Nl RV fxn. Mildly dil RA. Mod TR. Triv PR; b. 02/2019 cMRI: EF 35%.  . Diabetes mellitus  without complication ([place])   . LBBB (left bundle branch block)   . NICM (nonischemic cardiomyopathy) ([place])    a. 01/2019 Echo: EF 40-45%; b. 01/2019 Cath: nl cors. EF 35-45%; c. 02/2019 cMRI: Basal inf and basal infsept midwall LGE. EF 35%; d. 05/2019 s/p SJM [facility] HF CRT-D [id number] (ser# [id number]).   Past Surgical History:  Procedure Laterality Date  . BIV ICD INSERTION CRT-D N/A 05/06/2019   Procedure: BIV ICD INSERTION CRT-D;  Surgeon: Constance Haw, MD;  Location: [facility];  Service: Cardiovascular;  Laterality: N/A;  . LEFT HEART CATH AND CORONARY ANGIOGRAPHY N/A 01/13/2019   Procedure: LEFT HEART CATH AND CORONARY ANGIOGRAPHY;  Surgeon: Martinique, Peter M, MD;  Location: [facility];  Service: Cardiovascular;  Laterality: N/A;     Current Outpatient Medications  Medication Sig Dispense Refill  . acetaminophen (TYLENOL) 500 MG tablet Take 500 mg by mouth daily as needed for moderate pain or headache.    Marland Kitchen aspirin EC 81 MG tablet Take 81 mg by mouth daily.     . carvedilol (COREG) 6.25 MG tablet Take 1 tablet (6.25 mg total) by mouth 2 (two) times daily with a meal. 180 tablet 3  . cholecalciferol (VITAMIN D3) 25 MCG (1000 UT) tablet Take 1,000 Units by mouth daily.    Marland Kitchen lisinopril (ZESTRIL) 20 MG tablet Take 1 tablet (20 mg total) by mouth daily. 90 tablet 1  . metFORMIN (GLUCOPHAGE) 500 MG tablet Take 1 tablet (500 mg total) by mouth 2 (two) times daily.    . rosuvastatin (CRESTOR)  10 MG tablet Take 10 mg by mouth daily.     Marland Kitchen zinc gluconate 50 MG tablet Take 50 mg by mouth daily.     No current facility-administered medications for this visit.    Allergies:   Otho Darner allergy]   Social History:  The patient  reports that he has quit smoking. His smoking use included cigarettes. He has never used smokeless tobacco. He reports that he does not drink alcohol or use drugs.   Family History:  The patient's family history includes Aneurysm in his mother;  Heart attack in his father.   ROS:  Please see the history of present illness.   Otherwise, review of systems is positive for none.   All other systems are reviewed and negative.   PHYSICAL EXAM: VS:  BP 140/80   Pulse 69   Ht 5\' 8"  (1.727 m)   Wt 139 lb 12.8 oz (63.4 kg)   SpO2 97%   BMI 21.26 kg/m  , BMI Body mass index is 21.26 kg/m. GEN: Well nourished, well developed, in no acute distress  HEENT: normal  Neck: no JVD, carotid bruits, or masses Cardiac: RRR; no murmurs, rubs, or gallops,no edema  Respiratory:  clear to auscultation bilaterally, normal work of breathing GI: soft, nontender, nondistended, + BS MS: no deformity or atrophy  Skin: warm and dry, device site well healed Neuro:  Strength and sensation are intact Psych: euthymic mood, full affect  EKG:  EKG is ordered today. Personal review of the ekg ordered shows A sense, V paced  Personal review of the device interrogation today. Results in [place]: 04/24/2019: Hemoglobin 14.3; Magnesium 1.9; Platelets 233; TSH 2.319 07/12/2019: BUN 14; Creatinine, Ser 0.71; Potassium 5.4; Sodium 142    Lipid Panel     Component Value Date/Time   CHOL 123 01/10/2019 1027   TRIG 66 01/10/2019 1027   HDL 52 01/10/2019 1027   CHOLHDL 2.4 01/10/2019 1027   LDLCALC 57 01/10/2019 1027     Wt Readings from Last 3 Encounters:  08/09/19 139 lb 12.8 oz (63.4 kg)  07/12/19 140 lb 12.8 oz (63.9 kg)  06/14/19 137 lb 12.8 oz (62.5 kg)      Other studies Reviewed: Additional studies/ records that were reviewed today include: CMRI 02/10/19  Review of the above records today demonstrates:  1. 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 While transmural LGE suggests ischemic etiology, it is not in a coronary distribution and patient had normal coronaries on recent cath. 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  2. Mild LV dilatation with moderate systolic dysfunction (EF 33%). Basal inferior aneurysm  3.  Normal RV size and systolic function (EF 82%) LHC 01/13/19 1. Normal coronary anatomy 2. Moderate LV dysfunction 3. inferobasal aneurysm 4. Normal LVEDP  ASSESSMENT AND PLAN:  1.  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  Device functioning appropriately.  No changes at this time. Feels much improved now that his device has been implanted  2.  Systolic heart failure: Currently on lisinopril, carvedilol, Aldactone.  Is now status post [place] CRT-D implanted 05/06/2019. No signs of volume overload  Current medicines are reviewed at length with the patient today.   The patient does not have concerns regarding his medicines.  The following changes were made today: None  Labs/ tests  ordered today include:  Orders Placed This Encounter  Procedures  . EKG 12-Lead    Disposition:   FU with Elyon Zoll 9 months  Signed, Shrita Thien [name], MD  08/09/2019 2:43 PM     [facility] [address] [place] [address] [phone] (office) [phone] (fax)

## 2019-08-10 NOTE — Progress Notes (Signed)
Remote ICD transmission.   

## 2019-09-06 ENCOUNTER — Ambulatory Visit (INDEPENDENT_AMBULATORY_CARE_PROVIDER_SITE_OTHER): Payer: Medicare Other | Admitting: Pulmonary Disease

## 2019-09-06 ENCOUNTER — Encounter: Payer: Self-pay | Admitting: Pulmonary Disease

## 2019-09-06 ENCOUNTER — Other Ambulatory Visit: Payer: Self-pay

## 2019-09-06 VITALS — BP 118/64 | HR 64 | Temp 98.0°F | Ht 69.0 in | Wt 138.8 lb

## 2019-09-06 DIAGNOSIS — J439 Emphysema, unspecified: Secondary | ICD-10-CM | POA: Diagnosis not present

## 2019-09-06 DIAGNOSIS — I255 Ischemic cardiomyopathy: Secondary | ICD-10-CM

## 2019-09-06 DIAGNOSIS — R918 Other nonspecific abnormal finding of lung field: Secondary | ICD-10-CM | POA: Insufficient documentation

## 2019-09-06 DIAGNOSIS — J432 Centrilobular emphysema: Secondary | ICD-10-CM

## 2019-09-06 NOTE — Assessment & Plan Note (Signed)
Noted on CT. Schedule PFTs to assess. Dyspnea seems to have been improved after resynchronization therapy and doubt that he needs bronchodilators

## 2019-09-06 NOTE — Assessment & Plan Note (Signed)
Dominant nodule noted on right lower lobe was negative and PET scan in January and is unchanged in a 21-month interval most definitely benign. The other nodules will however need follow-up given his long prior history of smoking since they would have been below the resolution of the PET scan. We will schedule 70-month follow-up CT scan in December

## 2019-09-06 NOTE — Patient Instructions (Signed)
Nodule noted in right lower lobe did not light up on PET scan and is unchanged since January, very likely benign.  We will schedule repeat CT chest without contrast in December  Schedule PFTs for emphysema

## 2019-09-06 NOTE — Progress Notes (Signed)
Subjective:    Patient ID: Richard Martin, male    DOB: Dec 09, 1940, 79 y.o.   MRN: [id number]  HPI  79 year old heavy ex-smoker referred by cardiology for evaluation of pulmonary nodule  He has a history of nonischemic cardiomyopathy, he developed syncope and 2: 1 block, has baseline left bundle branch block.  Left heart catheterization showed no significant CAD.  He underwent cardiac MRI 02/2019 >> 'Transmural basal inferior LGE and basal inferoseptal midwall LGE. 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  He then underwent another follow-up CT in 79/2020 , which we reviewed today. He states that his dyspnea is much improved. He smoked a pack per day for more than 50 years before he quit in 2002. He denies frequent chest colds and has never needed any kind of inhalers.  He denies cough or wheezing  I have reviewed cardiology and EP consultation [facility] PET/CT report was reviewed from care everywhere   Significant tests/ events reviewed  PET CT cardiac 03/2019 >> Scattered bilateral pulmonary nodules, largest in the right lower lobe measuring up to 1.1 cm with minimal metabolic activity, which may represent atelectasis  Nodule #1: Right upper lobe nodule measuring 6 mm  Nodule #2: Left upper lobe pulmonary nodule measuring 5 mm Nodule #3: Right lower lobe subpleural nodule measuring 1.1 cm    CT chest 07/2019 severe emphysema, unchanged right lower lobe nodule, 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 Cardiac resynchronization therapy defibrillator (CRT-D) in place    a. 05/2019 s/p SJM [facility] HF CRT-D [id number] (ser# [id number]).  . Chronic combined systolic (congestive) and  diastolic (congestive) heart failure ([place])    a. 01/2019 Echo: EF 40-45%, focal basal inf aneurysmal region. Gr2 DD. Nl RV fxn. Mildly dil RA. Mod TR. Triv PR; b. 02/2019 cMRI: EF 35%.  . Diabetes mellitus without complication ([place])   . LBBB (left bundle branch block)   . NICM (nonischemic cardiomyopathy) ([place])    a. 01/2019 Echo: EF 40-45%; b. 01/2019 Cath: nl cors. EF 35-45%; c. 02/2019 cMRI: Basal inf and basal infsept midwall LGE. EF 35%; d. 05/2019 s/p SJM [facility] HF CRT-D [id number] (ser# [id number]).    Past Surgical History:  Procedure Laterality Date  . BIV ICD INSERTION CRT-D N/A 05/06/2019   Procedure: BIV ICD INSERTION CRT-D;  Surgeon: Constance Haw, MD;  Location: [facility];  Service: Cardiovascular;  Laterality: N/A;  . LEFT HEART CATH AND CORONARY ANGIOGRAPHY N/A 01/13/2019   Procedure: LEFT HEART CATH AND CORONARY ANGIOGRAPHY;  Surgeon: Martinique, Peter M, MD;  Location: [facility];  Service: Cardiovascular;  Laterality: N/A;    Allergies  Allergen Reactions  . Crab [Shellfish Allergy] Swelling    Social History   Socioeconomic History  . Marital status: Married    Spouse name: Not on file  . Number of children: Not on file  . Years of education: Not on file  . Highest education level: Not on file  Occupational History  . Not on file  Tobacco Use  . Smoking status: Former Smoker    Types: Cigarettes    Quit date: 2002    Years since quitting: 19.5  . Smokeless tobacco: Never Used  .  Tobacco comment: Quit 2002  Substance and Sexual Activity  . Alcohol use: No  . Drug use: No  . Sexual activity: Not on file  Other Topics Concern  . Not on file  Social History Narrative  . Not on file   Social Determinants of Health   Financial Resource Strain:   . Difficulty of Paying Living Expenses:   Food Insecurity:   . Worried About Charity fundraiser in the Last Year:   . Arboriculturist in the Last Year:   Transportation Needs:   . Lexicographer (Medical):   Marland Kitchen Lack of Transportation (Non-Medical):   Physical Activity:   . Days of Exercise per Week:   . Minutes of Exercise per Session:   Stress:   . Feeling of Stress :   Social Connections:   . Frequency of Communication with Friends and Family:   . Frequency of Social Gatherings with Friends and Family:   . Attends Religious Services:   . Active Member of Clubs or Organizations:   . Attends Archivist Meetings:   Marland Kitchen Marital Status:   Intimate Partner Violence:   . Fear of Current or Ex-Partner:   . Emotionally Abused:   Marland Kitchen Physically Abused:   . Sexually Abused:       Family History  Problem Relation Age of Onset  . Heart attack Father   . Aneurysm Mother       Review of Systems   Constitutional: negative for anorexia, fevers and sweats  Eyes: negative for irritation, redness and visual disturbance  Ears, nose, mouth, throat, and face: negative for earaches, epistaxis, nasal congestion and sore throat  Respiratory: negative for cough, sputum and wheezing  Cardiovascular: negative for chest pain,  lower extremity edema, orthopnea, palpitations and syncope  Gastrointestinal: negative for abdominal pain, constipation, diarrhea, melena, nausea and vomiting  Genitourinary:negative for dysuria, frequency and hematuria  Hematologic/lymphatic: negative for bleeding, easy bruising and lymphadenopathy  Musculoskeletal:negative for arthralgias, muscle weakness and stiff joints  Neurological: negative for coordination problems, gait problems, headaches and weakness  Endocrine: negative for diabetic symptoms including polydipsia, polyuria and weight loss     Objective:   Physical Exam   Gen. Pleasant, well-nourished, in no distress, normal affect ENT - no pallor,icterus, no post nasal drip Neck: No JVD, no thyromegaly, no carotid bruits Lungs: no use of accessory muscles, no dullness to percussion, clear without rales or rhonchi    Cardiovascular: Rhythm regular, heart sounds  normal, no murmurs or gallops, no peripheral edema Abdomen: soft and non-tender, no hepatosplenomegaly, BS normal. Musculoskeletal: No deformities, no cyanosis or clubbing Neuro:  alert, non focal        Assessment & Plan:

## 2019-09-11 NOTE — Progress Notes (Signed)
Cardiology Office Note:    Date:  09/12/2019   ID:  Richard Martin, DOB 1941/02/02, MRN [id number]  PCP:  Celene Squibb, MD  Cardiologist:  No primary care provider on file.  Electrophysiologist:  Will [name], MD   Referring MD: Celene Squibb, MD   Chief Complaint  Patient presents with  . Follow-up    2 MONTHS  . Congestive Heart Failure    History of Present Illness:    Richard Martin is a 79 y.o. male with a hx of diabetes, chronic combined systolic and diastolic heart failure and second-degree Mobitz 2 AV block presents for follow-up.  He was initially seen on 12/31/2018 after being referred by Dr. Nevada Crane for evaluation of EKG abnormalities and syncopal episode.  He had a episode of syncope where he was walking to his kitchen and had sudden loss of consciousness.  He was seen in clinic on 12/31/2018, and rhythm strip demonstrated intermittent 2:1 AV block, with a wide QRS.  He was referred to EP for evaluation for pacemaker, and an echocardiogram was ordered.  TTE was done on 01/04/2019, which showed EF 40 to 45%, with aneurysmal basal inferior segment.  TTE was also notable for grade 2 diastolic dysfunction, normal RV function, no significant valvular disease.  Given new systolic dysfunction, cardiac catheterization was done on 01/13/2019, which showed normal coronary arteries.  Cardiac MRI was done on 02/11/2019, which showed EF 35%, basal inferior aneurysm, transmural basal inferior LGE, basal inferoseptal mid wall LGE.  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  Zio patch x14 days was completed, which showed frequent second-degree Mobitz 2 AV block, with symptoms of dizziness corresponding to Mobitz 2 AV block.  He underwent cardiac PET at [facility] on 04/01/2019, which showed no active myocardial inflammation.  He underwent BiV ICD placement by Dr. Curt Bears on 05/06/2019.  Since CRT-D placed, reports lightheadedness/syncope has resolved and dyspnea/fatigue significantly  improved.  Since last clinic visit, he reports that he is doing well.  He has been walking 1 to 1.5 miles per day.  He denies any chest pain or dyspnea.  Denies any lightheadedness, syncope, or palpitations.   Past Medical History:  Diagnosis Date  . Cardiac resynchronization therapy defibrillator (CRT-D) in place    a. 05/2019 s/p [facility] HF CRT-D [id number] (ser# [id number]).  . Chronic combined systolic (congestive) and diastolic (congestive) heart failure ([place])    a. 01/2019 Echo: EF 40-45%, focal basal inf aneurysmal region. Gr2 DD. Nl RV fxn. Mildly dil RA. Mod TR. Triv PR; b. 02/2019 cMRI: EF 35%.  . Diabetes mellitus without complication ([place])   . LBBB (left bundle branch block)   . NICM (nonischemic cardiomyopathy) ([place])    a. 01/2019 Echo: EF 40-45%; b. 01/2019 Cath: nl cors. EF 35-45%; c. 02/2019 cMRI: Basal inf and basal infsept midwall LGE. EF 35%; d. 05/2019 s/p [facility] HF CRT-D [id number] (ser# [id number]).    Past Surgical History:  Procedure Laterality Date  . BIV ICD INSERTION CRT-D N/A 05/06/2019   Procedure: BIV ICD INSERTION CRT-D;  Surgeon: Constance Haw, MD;  Location: [place] [facility];  Service: Cardiovascular;  Laterality: N/A;  . LEFT HEART CATH AND CORONARY ANGIOGRAPHY N/A 01/13/2019   Procedure: LEFT HEART CATH AND CORONARY ANGIOGRAPHY;  Surgeon: Martinique, Peter M, MD;  Location: [place] [facility];  Service: Cardiovascular;  Laterality: N/A;    Current Medications: Current Meds  Medication Sig  . acetaminophen (TYLENOL) 500 MG tablet Take 500 mg by  mouth daily as needed for moderate pain or headache.  Marland Kitchen aspirin EC 81 MG tablet Take 81 mg by mouth daily.   . carvedilol (COREG) 6.25 MG tablet Take 1 tablet (6.25 mg total) by mouth 2 (two) times daily with a meal.  . cholecalciferol (VITAMIN D3) 25 MCG (1000 UT) tablet Take 1,000 Units by mouth daily.  Marland Kitchen lisinopril (ZESTRIL) 20 MG tablet Take 1 tablet (20 mg total) by mouth daily.  . metFORMIN  (GLUCOPHAGE) 500 MG tablet Take 1 tablet (500 mg total) by mouth 2 (two) times daily.  . rosuvastatin (CRESTOR) 10 MG tablet Take 10 mg by mouth daily.   Marland Kitchen zinc gluconate 50 MG tablet Take 50 mg by mouth daily.     Allergies:   Otho Darner allergy]   Social History   Socioeconomic History  . Marital status: Married    Spouse name: Not on file  . Number of children: Not on file  . Years of education: Not on file  . Highest education level: Not on file  Occupational History  . Not on file  Tobacco Use  . Smoking status: Former Smoker    Types: Cigarettes    Quit date: 2002    Years since quitting: 19.5  . Smokeless tobacco: Never Used  . Tobacco comment: Quit 2002  Substance and Sexual Activity  . Alcohol use: No  . Drug use: No  . Sexual activity: Not on file  Other Topics Concern  . Not on file  Social History Narrative  . Not on file   Social Determinants of Health   Financial Resource Strain:   . Difficulty of Paying Living Expenses:   Food Insecurity:   . Worried About Charity fundraiser in the Last Year:   . Arboriculturist in the Last Year:   Transportation Needs:   . Film/video editor (Medical):   Marland Kitchen Lack of Transportation (Non-Medical):   Physical Activity:   . Days of Exercise per Week:   . Minutes of Exercise per Session:   Stress:   . Feeling of Stress :   Social Connections:   . Frequency of Communication with Friends and Family:   . Frequency of Social Gatherings with Friends and Family:   . Attends Religious Services:   . Active Member of Clubs or Organizations:   . Attends Archivist Meetings:   Marland Kitchen Marital Status:      Family History: Father died of MI at age 60  ROS:   Please see the history of present illness.    All other systems reviewed and are negative.  EKGs/Labs/Other Studies Reviewed:    The following studies were reviewed today:   EKG:  EKG is not ordered today.  The ekg ordered most recently shows sinus  rhythm, BiV paced   TTE 01/04/19:  1. Left ventricular ejection fraction, by visual estimation, is 40 to 45%. The left ventricle has normal function. There is no left ventricular hypertrophy.  2. Basal inferior segment is abnormal.  3. Left ventricular diastolic parameters are consistent with Grade II diastolic dysfunction (pseudonormalization).  4. Global right ventricle has normal systolic function.The right ventricular size is mildly enlarged. No increase in right ventricular wall thickness.  5. Left atrial size was normal.  6. Right atrial size was mildly dilated.  7. The mitral valve is normal in structure. No evidence of mitral valve regurgitation. No evidence of mitral stenosis.  8. The tricuspid valve is normal in structure. Tricuspid  valve regurgitation moderate.  9. The aortic valve is normal in structure. Aortic valve regurgitation is not visualized. No evidence of aortic valve sclerosis or stenosis. 10. The pulmonic valve was normal in structure. Pulmonic valve regurgitation is trivial. 11. Mildly elevated pulmonary artery systolic pressure. 12. The inferior vena cava is normal in size with greater than 50% respiratory variability, suggesting right atrial pressure of 3 mmHg. 13. Focal basal inferior aneursymal region/infarct. 14. There is redundancy of the interatrial septum.  LHC 01/13/19:  There is moderate left ventricular systolic dysfunction.  LV end diastolic pressure is normal.  The left ventricular ejection fraction is 35-45% by visual estimate.   1. Normal coronary anatomy 2. Moderate LV dysfunction 3. inferobasal aneurysm 4. Normal LVEDP  CMR 02/11/19: 1. 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 While transmural LGE suggests ischemic etiology, it is not in a coronary distribution and patient had normal coronaries on recent cath. 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 2. Mild LV dilatation with moderate systolic dysfunction (EF 50%). Basal inferior aneurysm 3.  Normal RV size and systolic function (EF 35%)  Cardiac monitor 02/13/19:  Frequent second degree Mobitz II AV block. Symptoms of dizziness corresponded to Mobitz II AV block   14 days of data recorded on Zio monitor. Patient had a min HR of 29 bpm, max HR of 123 bpm, and avg HR of 69 bpm. Predominant underlying rhythm was Sinus Rhythm.  No VT, SVT, atrial fibrillation, or pauses noted. Isolated atrial and ventricular ectopy was rare (<1%).  8523 episodes of second degree Mobitz II AV block.  Patient triggered events, with symptoms reported of dizziness, corresponded to Mobitz II AV block.   Cardiac PET 04/01/19:  Scattered bilateral pulmonary nodules, largest in the right lower lobe measuring up to 1.1 cm with minimal metabolic activity,  which may represent atelectasis; recommend follow up chest CT in 3-6 months to assess for resolution.  FINAL COMMENTS  Rb-82 Cardiac PET/CT Myocardial Perfusion Imaging Study:   Abnormal rest perfusion in 4 out of 17 segments of the myocardium in a non "vascular" territory. This is consistent with  "scar/fibrosis" of the myocardium due to prior inflamation/infiltrative disease. It is not coronary artery disease related.   Rb-82 Cardiac PET/CT Functonal Imaging Study:   Sever decreased in left ventricular function. Dilated left ventricle. These findings are compatible with a nonischemic  cardiomyopathy.   F-18 FDG Cardiac PET/CT Myocardial Metabolic Study:   No evidence of active inflamation of the myocardium. No evidence of active inflamatory process of the myocardium to suggest  active sarcoid or active myocarditis.   Limited F-18 FDG PET/CT of the Chest and Abdomen:   Note that evaluation is somewhat limited due to lack of IV contrast and slice thickness, within this limitation:   The thyroid  is normal in appearance.   No mediastinal, hilar, or axillary lymphadenopathy. There are tiny mediastinal lymph nodes which do not meet size criteria for  pathologic enlargement. There are severe coronary artery and moderate thoracic aortic calcifications. No pericardial effusion.  The esophagus is normal in appearance.   No pleural effusion. No pneumothorax. Severe bilateral centrilobular and paraseptal emphysema. There are basal predominant  subpleural reticulations. There are scattered bilateral pulmonary nodules measuring up to 1.1 cm, for example:   Nodule #1: Right upper lobe nodule measuring 6 mm (series 3, image 80).  Nodule #2: Left upper  lobe pulmonary nodule measuring 5 mm (series 3, image 77).  Nodule #3: Right lower lobe subpleural nodule measuring 1.1 cm (series 3, image 110) (SUV max 1.4).   Liver has a normal contour. Multifocal hypodense liver lesions without significant metabolic activity, likely cysts. Gallbladder,  pancreas, and bilateral adrenal glands are normal in appearance. Punctate splenic calcifications, likely sequela of granulomatous  disease.   Bilateral kidneys are normal in size without evidence of hydronephrosis. Right sided nephrolithiasis.   Visualized stomach, small bowel, large bowel is normal in caliber. No evidence of focal wall thickening.   Visualized abdominal aorta is normal in caliber. No suspicious osseous lesions.  Recent Labs: 04/24/2019: Hemoglobin 14.3; Magnesium 1.9; Platelets 233; TSH 2.319 07/12/2019: BUN 14; Creatinine, Ser 0.71; Potassium 5.4; Sodium 142  Recent Lipid Panel    Component Value Date/Time   CHOL 123 01/10/2019 1027   TRIG 66 01/10/2019 1027   HDL 52 01/10/2019 1027   CHOLHDL 2.4 01/10/2019 1027   LDLCALC 57 01/10/2019 1027    Physical Exam:    VS:  BP 116/70 (BP Location: Left Arm, Patient Position: Sitting, Cuff Size: Normal)   Pulse 65   Ht 5\' 9"  (1.753 m)   Wt 139 lb 6.4 oz (63.2 kg)   SpO2 98%   BMI 20.59  kg/m     Wt Readings from Last 3 Encounters:  09/12/19 139 lb 6.4 oz (63.2 kg)  09/06/19 138 lb 12.8 oz (63 kg)  08/09/19 139 lb 12.8 oz (63.4 kg)     GEN:  Well nourished, well developed in no acute distress HEENT: Normal NECK: No JVD LYMPHATICS: No lymphadenopathy CARDIAC: RRR, no murmurs, rubs, gallops RESPIRATORY:  Clear to auscultation without rales, wheezing or rhonchi  ABDOMEN: Soft, non-tender, non-distended MUSCULOSKELETAL:  No edema SKIN: Warm and dry NEUROLOGIC:  Alert and oriented x 3 PSYCHIATRIC:  Normal affect   ASSESSMENT:    1. Chronic combined systolic (congestive) and diastolic (congestive) heart failure ([place])   2. Medication management   3. Second degree Mobitz II AV block   4. Hyperlipidemia, unspecified hyperlipidemia type    PLAN:     Chronic combined systolic and diastolic heart failure: Normal coronary arteries on cath.  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  No evidence of active myocardial inflammation on cardiac PET at [facility] on 04/01/2019.  Status post CRT-D placement by Dr. Curt Bears on 05/06/2019 - Continue lisinopril 20 mg daily. - Continue carvedilol 6.25 mg twice daily - Spironolactone held due to hyperkalemia.  Will recheck BMP - Follows with EP for ICD management   Second-degree Mobitz 2 AV block: Presented with syncope.  Zio patch confirmed frequent episodes of second-degree Mobitz 2 AV block with symptoms of dizziness corresponding to periods of Mobitz 2. S/p CRT-D on 05/06/19  Hyperlipidemia: On rosuvastatin 10 mg daily.  LDL 57 on 01/10/2019  Type 2 diabetes: Well-controlled, reports A1c 6.4%.  On Metformin  Pulmonary nodules/COPD: Scattered bilateral nodules noted on PET/CT at [facility], recommended chest CT follow-up in 3 to 6 months.  CT chest 07/07/2019 showed several small pulmonary nodules, along with moderate to severe emphysema.  Referred to pulmonology for further evaluation  RTC in 3  months    Medication Adjustments/Labs and Tests Ordered: Current medicines are reviewed at length with the patient today.  Concerns regarding medicines are outlined above.  Orders Placed This Encounter  Procedures  . Basic Metabolic Panel (BMET)   No orders of the defined types  were placed in this encounter.   Patient Instructions  Medication Instructions:  Your physician recommends that you continue on your current medications as directed. Please refer to the Current Medication list given to you today.  *If you need a refill on your cardiac medications before your next appointment, please call your pharmacy*   Lab Work: BMET  If you have labs (blood work) drawn today and your tests are completely normal, you will receive your results only by: Marland Kitchen MyChart Message (if you have MyChart) OR . A paper copy in the mail If you have any lab test that is abnormal or we need to change your treatment, we will call you to review the results.   Testing/Procedures: none   Follow-Up: At [facility], you and your health needs are our priority.  As part of our continuing mission to provide you with exceptional heart care, we have created designated Provider Care Teams.  These Care Teams include your primary Cardiologist (physician) and Advanced Practice Providers (APPs -  Physician Assistants and Nurse Practitioners) who all work together to provide you with the care you need, when you need it.  We recommend signing up for the patient portal called "MyChart".  Sign up information is provided on this After Visit Summary.  MyChart is used to connect with patients for Virtual Visits (Telemedicine).  Patients are able to view lab/test results, encounter notes, upcoming appointments, etc.  Non-urgent messages can be sent to your provider as well.   To learn more about what you can do with MyChart, go to [email or web address].    Your next appointment:   3 month(s)  The format for your next  appointment:   In Person  Provider:   Oswaldo Milian, MD   Other Instructions      Signed, Donato Heinz, MD  09/12/2019 10:56 PM    [place]

## 2019-09-12 ENCOUNTER — Other Ambulatory Visit: Payer: Self-pay

## 2019-09-12 ENCOUNTER — Encounter: Payer: Self-pay | Admitting: Cardiology

## 2019-09-12 ENCOUNTER — Ambulatory Visit (INDEPENDENT_AMBULATORY_CARE_PROVIDER_SITE_OTHER): Payer: Medicare Other | Admitting: Cardiology

## 2019-09-12 VITALS — BP 116/70 | HR 65 | Ht 69.0 in | Wt 139.4 lb

## 2019-09-12 DIAGNOSIS — Z79899 Other long term (current) drug therapy: Secondary | ICD-10-CM

## 2019-09-12 DIAGNOSIS — I5042 Chronic combined systolic (congestive) and diastolic (congestive) heart failure: Secondary | ICD-10-CM | POA: Diagnosis not present

## 2019-09-12 DIAGNOSIS — I441 Atrioventricular block, second degree: Secondary | ICD-10-CM | POA: Diagnosis not present

## 2019-09-12 DIAGNOSIS — E785 Hyperlipidemia, unspecified: Secondary | ICD-10-CM | POA: Diagnosis not present

## 2019-09-12 NOTE — Patient Instructions (Signed)
Medication Instructions:  Your physician recommends that you continue on your current medications as directed. Please refer to the Current Medication list given to you today.  *If you need a refill on your cardiac medications before your next appointment, please call your pharmacy*   Lab Work: BMET  If you have labs (blood work) drawn today and your tests are completely normal, you will receive your results only by: Marland Kitchen MyChart Message (if you have MyChart) OR . A paper copy in the mail If you have any lab test that is abnormal or we need to change your treatment, we will call you to review the results.   Testing/Procedures: none   Follow-Up: At [facility], you and your health needs are our priority.  As part of our continuing mission to provide you with exceptional heart care, we have created designated Provider Care Teams.  These Care Teams include your primary Cardiologist (physician) and Advanced Practice Providers (APPs -  Physician Assistants and Nurse Practitioners) who all work together to provide you with the care you need, when you need it.  We recommend signing up for the patient portal called "MyChart".  Sign up information is provided on this After Visit Summary.  MyChart is used to connect with patients for Virtual Visits (Telemedicine).  Patients are able to view lab/test results, encounter notes, upcoming appointments, etc.  Non-urgent messages can be sent to your provider as well.   To learn more about what you can do with MyChart, go to [email or web address].    Your next appointment:   3 month(s)  The format for your next appointment:   In Person  Provider:   Oswaldo Milian, MD   Other Instructions

## 2019-09-13 ENCOUNTER — Other Ambulatory Visit: Payer: Self-pay | Admitting: *Deleted

## 2019-09-13 DIAGNOSIS — I5042 Chronic combined systolic (congestive) and diastolic (congestive) heart failure: Secondary | ICD-10-CM

## 2019-09-13 DIAGNOSIS — E875 Hyperkalemia: Secondary | ICD-10-CM

## 2019-09-13 DIAGNOSIS — Z79899 Other long term (current) drug therapy: Secondary | ICD-10-CM

## 2019-09-13 LAB — BASIC METABOLIC PANEL
BUN/Creatinine Ratio: 21 (ref 10–24)
BUN: 15 mg/dL (ref 8–27)
CO2: 26 mmol/L (ref 20–29)
Calcium: 9.9 mg/dL (ref 8.6–10.2)
Chloride: 99 mmol/L (ref 96–106)
Creatinine, Ser: 0.71 mg/dL — ABNORMAL LOW (ref 0.76–1.27)
GFR calc Af Amer: 104 mL/min/{1.73_m2} (ref >59)
GFR calc non Af Amer: 90 mL/min/{1.73_m2} (ref >59)
Glucose: 143 mg/dL — ABNORMAL HIGH (ref 65–99)
Potassium: 5.3 mmol/L — ABNORMAL HIGH (ref 3.5–5.2)
Sodium: 139 mmol/L (ref 134–144)

## 2019-09-16 ENCOUNTER — Other Ambulatory Visit: Payer: Self-pay

## 2019-09-16 ENCOUNTER — Ambulatory Visit (INDEPENDENT_AMBULATORY_CARE_PROVIDER_SITE_OTHER): Payer: Medicare Other | Admitting: Pulmonary Disease

## 2019-09-16 DIAGNOSIS — J439 Emphysema, unspecified: Secondary | ICD-10-CM | POA: Diagnosis not present

## 2019-09-16 LAB — PULMONARY FUNCTION TEST
DL/VA % pred: 80 %
DL/VA: 3.15 ml/min/mmHg/L
DLCO cor % pred: 63 %
DLCO cor: 15.13 ml/min/mmHg
DLCO unc % pred: 63 %
DLCO unc: 15.13 ml/min/mmHg
FEF 25-75 Post: 1.89 L/sec
FEF 25-75 Pre: 1.56 L/sec
FEF2575-%Change-Post: 21 %
FEF2575-%Pred-Post: 95 %
FEF2575-%Pred-Pre: 78 %
FEV1-%Change-Post: 6 %
FEV1-%Pred-Post: 89 %
FEV1-%Pred-Pre: 83 %
FEV1-Post: 2.51 L
FEV1-Pre: 2.36 L
FEV1FVC-%Change-Post: -1 %
FEV1FVC-%Pred-Pre: 96 %
FEV6-%Change-Post: 8 %
FEV6-%Pred-Post: 99 %
FEV6-%Pred-Pre: 91 %
FEV6-Post: 3.66 L
FEV6-Pre: 3.37 L
FEV6FVC-%Change-Post: 0 %
FEV6FVC-%Pred-Post: 106 %
FEV6FVC-%Pred-Pre: 107 %
FVC-%Change-Post: 8 %
FVC-%Pred-Post: 93 %
FVC-%Pred-Pre: 86 %
FVC-Post: 3.69 L
FVC-Pre: 3.41 L
Post FEV1/FVC ratio: 68 %
Post FEV6/FVC ratio: 99 %
Pre FEV1/FVC ratio: 69 %
Pre FEV6/FVC Ratio: 100 %
RV % pred: 113 %
RV: 2.91 L
TLC % pred: 93 %
TLC: 6.38 L

## 2019-09-16 NOTE — Progress Notes (Signed)
Full PFT performed today. °

## 2019-10-17 ENCOUNTER — Other Ambulatory Visit: Payer: Self-pay | Admitting: Adult Health

## 2019-11-04 ENCOUNTER — Ambulatory Visit (INDEPENDENT_AMBULATORY_CARE_PROVIDER_SITE_OTHER): Payer: Medicare Other | Admitting: *Deleted

## 2019-11-04 DIAGNOSIS — I428 Other cardiomyopathies: Secondary | ICD-10-CM

## 2019-11-05 LAB — CUP PACEART REMOTE DEVICE CHECK
Battery Remaining Longevity: 82 mo
Battery Remaining Percentage: 92 %
Battery Voltage: 2.98 V
Brady Statistic AP VP Percent: 13 %
Brady Statistic AP VS Percent: 1 %
Brady Statistic AS VP Percent: 87 %
Brady Statistic AS VS Percent: 1 %
Brady Statistic RA Percent Paced: 13 %
Date Time Interrogation Session: 20210903031920
HighPow Impedance: 69 Ohm
Implantable Lead Implant Date: 20210305
Implantable Lead Implant Date: 20210305
Implantable Lead Implant Date: 20210305
Implantable Lead Location: 753858
Implantable Lead Location: 753859
Implantable Lead Location: 753860
Implantable Pulse Generator Implant Date: 20210305
Lead Channel Impedance Value: 460 Ohm
Lead Channel Impedance Value: 480 Ohm
Lead Channel Impedance Value: 990 Ohm
Lead Channel Pacing Threshold Amplitude: 0.75 V
Lead Channel Pacing Threshold Amplitude: 1.125 V
Lead Channel Pacing Threshold Amplitude: 2 V
Lead Channel Pacing Threshold Pulse Width: 0.5 ms
Lead Channel Pacing Threshold Pulse Width: 0.5 ms
Lead Channel Pacing Threshold Pulse Width: 0.8 ms
Lead Channel Sensing Intrinsic Amplitude: 12 mV
Lead Channel Sensing Intrinsic Amplitude: 3.8 mV
Lead Channel Setting Pacing Amplitude: 1.75 V
Lead Channel Setting Pacing Amplitude: 2.125
Lead Channel Setting Pacing Amplitude: 2.5 V
Lead Channel Setting Pacing Pulse Width: 0.5 ms
Lead Channel Setting Pacing Pulse Width: 0.8 ms
Lead Channel Setting Sensing Sensitivity: 0.5 mV
Pulse Gen Serial Number: 111018177

## 2019-11-08 NOTE — Progress Notes (Signed)
Remote ICD transmission.   

## 2019-12-11 NOTE — Progress Notes (Signed)
Cardiology Office Note:    Date:  12/15/2019   ID:  Richard Martin, DOB 08/26/1940, MRN [id number]  PCP:  Celene Squibb, MD  Cardiologist:  No primary care provider on file.  Electrophysiologist:  Will [name], MD   Referring MD: Celene Squibb, MD   Chief Complaint  Patient presents with  . Congestive Heart Failure    History of Present Illness:    Richard Martin is a 79 y.o. male with a hx of diabetes, chronic combined systolic and diastolic heart failure and second-degree Mobitz 2 AV block presents for follow-up.  He was initially seen on 12/31/2018 after being referred by Dr. Nevada Crane for evaluation of EKG abnormalities and syncopal episode.  He had a episode of syncope where he was walking to his kitchen and had sudden loss of consciousness.  He was seen in clinic on 12/31/2018, and rhythm strip demonstrated intermittent 2:1 AV block, with a wide QRS.  He was referred to EP for evaluation for pacemaker, and an echocardiogram was ordered.  TTE was done on 01/04/2019, which showed EF 40 to 45%, with aneurysmal basal inferior segment.  TTE was also notable for grade 2 diastolic dysfunction, normal RV function, no significant valvular disease.  Given new systolic dysfunction, cardiac catheterization was done on 01/13/2019, which showed normal coronary arteries.  Cardiac MRI was done on 02/11/2019, which showed EF 35%, basal inferior aneurysm, transmural basal inferior LGE, basal inferoseptal mid wall LGE.  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  Zio patch x14 days was completed, which showed frequent second-degree Mobitz 2 AV block, with symptoms of dizziness corresponding to Mobitz 2 AV block.  He underwent cardiac PET at [facility] on 04/01/2019, which showed no active myocardial inflammation.  He underwent BiV ICD placement by Dr. Curt Bears on 05/06/2019.  Since CRT-D placed, reports lightheadedness/syncope has resolved and dyspnea/fatigue significantly improved.  Since last clinic visit, he  reports that he has been doing well.  He denies any lightheadedness, syncope, chest pain, or dyspnea.  Reports he has been walking 1 mile every other day.  BP stable 130s to 140s over 60s when he checks at home.   Wt Readings from Last 3 Encounters:  12/15/19 134 lb 6.4 oz (61 kg)  09/12/19 139 lb 6.4 oz (63.2 kg)  09/06/19 138 lb 12.8 oz (63 kg)   BP Readings from Last 3 Encounters:  12/15/19 132/74  09/12/19 116/70  09/06/19 118/64     he reports that he is doing well.  He has been walking 1 to 1.5 miles per day.  He denies any chest pain or dyspnea.  Denies any lightheadedness, syncope, or palpitations.   Past Medical History:  Diagnosis Date  . Cardiac resynchronization therapy defibrillator (CRT-D) in place    a. 05/2019 s/p [facility] HF CRT-D [id number] (ser# [id number]).  . Chronic combined systolic (congestive) and diastolic (congestive) heart failure ([place])    a. 01/2019 Echo: EF 40-45%, focal basal inf aneurysmal region. Gr2 DD. Nl RV fxn. Mildly dil RA. Mod TR. Triv PR; b. 02/2019 cMRI: EF 35%.  . Diabetes mellitus without complication ([place])   . LBBB (left bundle branch block)   . NICM (nonischemic cardiomyopathy) ([place])    a. 01/2019 Echo: EF 40-45%; b. 01/2019 Cath: nl cors. EF 35-45%; c. 02/2019 cMRI: Basal inf and basal infsept midwall LGE. EF 35%; d. 05/2019 s/p [facility] HF CRT-D [id number] (ser# [id number]).    Past Surgical History:  Procedure Laterality Date  . BIV ICD  INSERTION CRT-D N/A 05/06/2019   Procedure: BIV ICD INSERTION CRT-D;  Surgeon: Constance Haw, MD;  Location: [place] [facility];  Service: Cardiovascular;  Laterality: N/A;  . LEFT HEART CATH AND CORONARY ANGIOGRAPHY N/A 01/13/2019   Procedure: LEFT HEART CATH AND CORONARY ANGIOGRAPHY;  Surgeon: Martinique, Peter M, MD;  Location: [place] [facility];  Service: Cardiovascular;  Laterality: N/A;    Current Medications: Current Meds  Medication Sig  . acetaminophen (TYLENOL) 500 MG tablet Take 500  mg by mouth daily as needed for moderate pain or headache.  Marland Kitchen aspirin EC 81 MG tablet Take 81 mg by mouth daily.   . carvedilol (COREG) 12.5 MG tablet Take 1 tablet (12.5 mg total) by mouth 2 (two) times daily with a meal.  . cholecalciferol (VITAMIN D3) 25 MCG (1000 UT) tablet Take 1,000 Units by mouth daily.  Marland Kitchen lisinopril (ZESTRIL) 20 MG tablet TAKE (1) TABLET BY MOUTH ONCE DAILY.  . metFORMIN (GLUCOPHAGE) 500 MG tablet Take 1 tablet (500 mg total) by mouth 2 (two) times daily.  . rosuvastatin (CRESTOR) 10 MG tablet Take 10 mg by mouth daily.   Marland Kitchen zinc gluconate 50 MG tablet Take 50 mg by mouth daily.  . [DISCONTINUED] carvedilol (COREG) 6.25 MG tablet Take 1 tablet (6.25 mg total) by mouth 2 (two) times daily with a meal.     Allergies:   Otho Darner allergy]   Social History   Socioeconomic History  . Marital status: Married    Spouse name: Not on file  . Number of children: Not on file  . Years of education: Not on file  . Highest education level: Not on file  Occupational History  . Not on file  Tobacco Use  . Smoking status: Former Smoker    Types: Cigarettes    Quit date: 2002    Years since quitting: 19.7  . Smokeless tobacco: Never Used  . Tobacco comment: Quit 2002  Substance and Sexual Activity  . Alcohol use: No  . Drug use: No  . Sexual activity: Not on file  Other Topics Concern  . Not on file  Social History Narrative  . Not on file   Social Determinants of Health   Financial Resource Strain:   . Difficulty of Paying Living Expenses: Not on file  Food Insecurity:   . Worried About Charity fundraiser in the Last Year: Not on file  . Ran Out of Food in the Last Year: Not on file  Transportation Needs:   . Lack of Transportation (Medical): Not on file  . Lack of Transportation (Non-Medical): Not on file  Physical Activity:   . Days of Exercise per Week: Not on file  . Minutes of Exercise per Session: Not on file  Stress:   . Feeling of Stress :  Not on file  Social Connections:   . Frequency of Communication with Friends and Family: Not on file  . Frequency of Social Gatherings with Friends and Family: Not on file  . Attends Religious Services: Not on file  . Active Member of Clubs or Organizations: Not on file  . Attends Archivist Meetings: Not on file  . Marital Status: Not on file     Family History: Father died of MI at age 78  ROS:   Please see the history of present illness.    All other systems reviewed and are negative.  EKGs/Labs/Other Studies Reviewed:    The following studies were reviewed today:  EKG:  EKG is not ordered today.  The ekg ordered most recently shows sinus rhythm, BiV paced   TTE 01/04/19:  1. Left ventricular ejection fraction, by visual estimation, is 40 to 45%. The left ventricle has normal function. There is no left ventricular hypertrophy.  2. Basal inferior segment is abnormal.  3. Left ventricular diastolic parameters are consistent with Grade II diastolic dysfunction (pseudonormalization).  4. Global right ventricle has normal systolic function.The right ventricular size is mildly enlarged. No increase in right ventricular wall thickness.  5. Left atrial size was normal.  6. Right atrial size was mildly dilated.  7. The mitral valve is normal in structure. No evidence of mitral valve regurgitation. No evidence of mitral stenosis.  8. The tricuspid valve is normal in structure. Tricuspid valve regurgitation moderate.  9. The aortic valve is normal in structure. Aortic valve regurgitation is not visualized. No evidence of aortic valve sclerosis or stenosis. 10. The pulmonic valve was normal in structure. Pulmonic valve regurgitation is trivial. 11. Mildly elevated pulmonary artery systolic pressure. 12. The inferior vena cava is normal in size with greater than 50% respiratory variability, suggesting right atrial pressure of 3 mmHg. 13. Focal basal inferior aneursymal  region/infarct. 14. There is redundancy of the interatrial septum.  LHC 01/13/19:  There is moderate left ventricular systolic dysfunction.  LV end diastolic pressure is normal.  The left ventricular ejection fraction is 35-45% by visual estimate.   1. Normal coronary anatomy 2. Moderate LV dysfunction 3. inferobasal aneurysm 4. Normal LVEDP  CMR 02/11/19: 1. 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 While transmural LGE suggests ischemic etiology, it is not in a coronary distribution and patient had normal coronaries on recent cath. 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 2. Mild LV dilatation with moderate systolic dysfunction (EF 16%). Basal inferior aneurysm 3.  Normal RV size and systolic function (EF 10%)  Cardiac monitor 02/13/19:  Frequent second degree Mobitz II AV block. Symptoms of dizziness corresponded to Mobitz II AV block   14 days of data recorded on Zio monitor. Patient had a min HR of 29 bpm, max HR of 123 bpm, and avg HR of 69 bpm. Predominant underlying rhythm was Sinus Rhythm.  No VT, SVT, atrial fibrillation, or pauses noted. Isolated atrial and ventricular ectopy was rare (<1%).  8523 episodes of second degree Mobitz II AV block.  Patient triggered events, with symptoms reported of dizziness, corresponded to Mobitz II AV block.   Cardiac PET 04/01/19:  Scattered bilateral pulmonary nodules, largest in the right lower lobe measuring up to 1.1 cm with minimal metabolic activity,  which may represent atelectasis; recommend follow up chest CT in 3-6 months to assess for resolution.  FINAL COMMENTS  Rb-82 Cardiac PET/CT Myocardial Perfusion Imaging Study:   Abnormal rest perfusion in 4 out of 17 segments of the myocardium in a non "vascular" territory. This is consistent with  "scar/fibrosis" of the myocardium  due to prior inflamation/infiltrative disease. It is not coronary artery disease related.   Rb-82 Cardiac PET/CT Functonal Imaging Study:   Sever decreased in left ventricular function. Dilated left ventricle. These findings are compatible with a nonischemic  cardiomyopathy.   F-18 FDG Cardiac PET/CT Myocardial Metabolic Study:   No evidence of active inflamation of the myocardium. No evidence of active inflamatory process of the myocardium to suggest  active sarcoid or active myocarditis.  Limited F-18 FDG PET/CT of the Chest and Abdomen:   Note that evaluation is somewhat limited due to lack of IV contrast and slice thickness, within this limitation:   The thyroid is normal in appearance.   No mediastinal, hilar, or axillary lymphadenopathy. There are tiny mediastinal lymph nodes which do not meet size criteria for  pathologic enlargement. There are severe coronary artery and moderate thoracic aortic calcifications. No pericardial effusion.  The esophagus is normal in appearance.   No pleural effusion. No pneumothorax. Severe bilateral centrilobular and paraseptal emphysema. There are basal predominant  subpleural reticulations. There are scattered bilateral pulmonary nodules measuring up to 1.1 cm, for example:   Nodule #1: Right upper lobe nodule measuring 6 mm (series 3, image 80).  Nodule #2: Left upper lobe pulmonary nodule measuring 5 mm (series 3, image 77).  Nodule #3: Right lower lobe subpleural nodule measuring 1.1 cm (series 3, image 110) (SUV max 1.4).   Liver has a normal contour. Multifocal hypodense liver lesions without significant metabolic activity, likely cysts. Gallbladder,  pancreas, and bilateral adrenal glands are normal in appearance. Punctate splenic calcifications, likely sequela of granulomatous  disease.   Bilateral kidneys are normal in size without evidence of hydronephrosis. Right sided nephrolithiasis.   Visualized stomach, small bowel, large  bowel is normal in caliber. No evidence of focal wall thickening.   Visualized abdominal aorta is normal in caliber. No suspicious osseous lesions.  Recent Labs: 04/24/2019: Hemoglobin 14.3; Magnesium 1.9; Platelets 233; TSH 2.319 09/12/2019: BUN 15; Creatinine, Ser 0.71; Potassium 5.3; Sodium 139  Recent Lipid Panel    Component Value Date/Time   CHOL 123 01/10/2019 1027   TRIG 66 01/10/2019 1027   HDL 52 01/10/2019 1027   CHOLHDL 2.4 01/10/2019 1027   LDLCALC 57 01/10/2019 1027    Physical Exam:    VS:  BP 132/74   Pulse 72   Ht 5\' 9"  (1.753 m)   Wt 134 lb 6.4 oz (61 kg)   SpO2 95%   BMI 19.85 kg/m     Wt Readings from Last 3 Encounters:  12/15/19 134 lb 6.4 oz (61 kg)  09/12/19 139 lb 6.4 oz (63.2 kg)  09/06/19 138 lb 12.8 oz (63 kg)     GEN:  Well nourished, well developed in no acute distress HEENT: Normal NECK: No JVD CARDIAC: RRR, no murmurs, rubs, gallops RESPIRATORY:  Clear to auscultation without rales, wheezing or rhonchi  ABDOMEN: Soft, non-tender, non-distended MUSCULOSKELETAL:  No edema SKIN: Warm and dry NEUROLOGIC:  Alert and oriented x 3 PSYCHIATRIC:  Normal affect   ASSESSMENT:    1. Chronic combined systolic (congestive) and diastolic (congestive) heart failure ([place])   2. Second degree Mobitz II AV block   3. Hyperlipidemia, unspecified hyperlipidemia type   4. Pulmonary nodules    PLAN:    Chronic combined systolic and diastolic heart failure: Normal coronary arteries on cath.  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  No evidence of active myocardial inflammation on cardiac PET at [facility] on 04/01/2019.  Status post CRT-D placement by Dr. Curt Bears on 05/06/2019 - Continue lisinopril 20 mg daily.  Has not been on Entresto due to hyperkalemia - On carvedilol 6.25 mg twice daily, will increase to 12.5 mg twice daily - Previously on spironolactone but held due to hyperkalemia.  Will recheck BMP -  Follows with EP for ICD management   Second-degree Mobitz 2 AV block: Presented with syncope.  Zio patch  confirmed frequent episodes of second-degree Mobitz 2 AV block with symptoms of dizziness corresponding to periods of Mobitz 2. S/p CRT-D on 05/06/19  Hyperlipidemia: On rosuvastatin 10 mg daily.  LDL 57 on 01/10/2019  Type 2 diabetes: Well-controlled, reports A1c 7.1%.  On Metformin  Pulmonary nodules/COPD: Scattered bilateral nodules noted on PET/CT at [facility], recommended chest CT follow-up in 3 to 6 months.  CT chest 07/07/2019 showed several small pulmonary nodules, along with moderate to severe emphysema.  Referred to pulmonology for further evaluation, had PFTs which showed mild emphysema.    RTC in 6 months    Medication Adjustments/Labs and Tests Ordered: Current medicines are reviewed at length with the patient today.  Concerns regarding medicines are outlined above.  Orders Placed This Encounter  Procedures  . Basic metabolic panel   Meds ordered this encounter  Medications  . carvedilol (COREG) 12.5 MG tablet    Sig: Take 1 tablet (12.5 mg total) by mouth 2 (two) times daily with a meal.    Dispense:  180 tablet    Refill:  3    Dose increase    Patient Instructions  Medication Instructions:  INCREASE carvedilol (Coreg) to 12.5 mg two times daily  *If you need a refill on your cardiac medications before your next appointment, please call your pharmacy*   Lab Work: BMET today  If you have labs (blood work) drawn today and your tests are completely normal, you will receive your results only by: Marland Kitchen MyChart Message (if you have MyChart) OR . A paper copy in the mail If you have any lab test that is abnormal or we need to change your treatment, we will call you to review the results.  Follow-Up: At [facility], you and your health needs are our priority.  As part of our continuing mission to provide you with exceptional heart care, we have created designated  Provider Care Teams.  These Care Teams include your primary Cardiologist (physician) and Advanced Practice Providers (APPs -  Physician Assistants and Nurse Practitioners) who all work together to provide you with the care you need, when you need it.  We recommend signing up for the patient portal called "MyChart".  Sign up information is provided on this After Visit Summary.  MyChart is used to connect with patients for Virtual Visits (Telemedicine).  Patients are able to view lab/test results, encounter notes, upcoming appointments, etc.  Non-urgent messages can be sent to your provider as well.   To learn more about what you can do with MyChart, go to [email or web address].    Your next appointment:   6 month(s)  The format for your next appointment:   In Person  Provider:   Oswaldo Milian, MD        Signed, Donato Heinz, MD  12/15/2019 1:12 PM    [place]

## 2019-12-15 ENCOUNTER — Encounter: Payer: Self-pay | Admitting: Cardiology

## 2019-12-15 ENCOUNTER — Other Ambulatory Visit: Payer: Self-pay

## 2019-12-15 ENCOUNTER — Ambulatory Visit (INDEPENDENT_AMBULATORY_CARE_PROVIDER_SITE_OTHER): Payer: Medicare Other | Admitting: Cardiology

## 2019-12-15 VITALS — BP 132/74 | HR 72 | Ht 69.0 in | Wt 134.4 lb

## 2019-12-15 DIAGNOSIS — I441 Atrioventricular block, second degree: Secondary | ICD-10-CM

## 2019-12-15 DIAGNOSIS — R918 Other nonspecific abnormal finding of lung field: Secondary | ICD-10-CM

## 2019-12-15 DIAGNOSIS — I5042 Chronic combined systolic (congestive) and diastolic (congestive) heart failure: Secondary | ICD-10-CM

## 2019-12-15 DIAGNOSIS — E785 Hyperlipidemia, unspecified: Secondary | ICD-10-CM

## 2019-12-15 MED ORDER — CARVEDILOL 12.5 MG PO TABS: 12.5000 mg | ORAL_TABLET | Freq: Two times a day (BID) | ORAL | 3 refills | Status: DC

## 2019-12-15 NOTE — Patient Instructions (Signed)
Medication Instructions:  INCREASE carvedilol (Coreg) to 12.5 mg two times daily  *If you need a refill on your cardiac medications before your next appointment, please call your pharmacy*   Lab Work: BMET today  If you have labs (blood work) drawn today and your tests are completely normal, you will receive your results only by: Marland Kitchen MyChart Message (if you have MyChart) OR . A paper copy in the mail If you have any lab test that is abnormal or we need to change your treatment, we will call you to review the results.  Follow-Up: At [facility], you and your health needs are our priority.  As part of our continuing mission to provide you with exceptional heart care, we have created designated Provider Care Teams.  These Care Teams include your primary Cardiologist (physician) and Advanced Practice Providers (APPs -  Physician Assistants and Nurse Practitioners) who all work together to provide you with the care you need, when you need it.  We recommend signing up for the patient portal called "MyChart".  Sign up information is provided on this After Visit Summary.  MyChart is used to connect with patients for Virtual Visits (Telemedicine).  Patients are able to view lab/test results, encounter notes, upcoming appointments, etc.  Non-urgent messages can be sent to your provider as well.   To learn more about what you can do with MyChart, go to [email or web address].    Your next appointment:   6 month(s)  The format for your next appointment:   In Person  Provider:   Oswaldo Milian, MD

## 2019-12-16 LAB — BASIC METABOLIC PANEL
BUN/Creatinine Ratio: 24 (ref 10–24)
BUN: 15 mg/dL (ref 8–27)
CO2: 26 mmol/L (ref 20–29)
Calcium: 9.8 mg/dL (ref 8.6–10.2)
Chloride: 100 mmol/L (ref 96–106)
Creatinine, Ser: 0.62 mg/dL — ABNORMAL LOW (ref 0.76–1.27)
GFR calc Af Amer: 110 mL/min/{1.73_m2} (ref >59)
GFR calc non Af Amer: 95 mL/min/{1.73_m2} (ref >59)
Glucose: 166 mg/dL — ABNORMAL HIGH (ref 65–99)
Potassium: 4.6 mmol/L (ref 3.5–5.2)
Sodium: 139 mmol/L (ref 134–144)

## 2019-12-20 DIAGNOSIS — Z23 Encounter for immunization: Secondary | ICD-10-CM | POA: Diagnosis not present

## 2020-01-03 DIAGNOSIS — G589 Mononeuropathy, unspecified: Secondary | ICD-10-CM | POA: Diagnosis not present

## 2020-01-03 DIAGNOSIS — E119 Type 2 diabetes mellitus without complications: Secondary | ICD-10-CM | POA: Diagnosis not present

## 2020-01-03 DIAGNOSIS — E109 Type 1 diabetes mellitus without complications: Secondary | ICD-10-CM | POA: Diagnosis not present

## 2020-01-03 DIAGNOSIS — E559 Vitamin D deficiency, unspecified: Secondary | ICD-10-CM | POA: Diagnosis not present

## 2020-01-03 DIAGNOSIS — I253 Aneurysm of heart: Secondary | ICD-10-CM | POA: Diagnosis not present

## 2020-01-03 DIAGNOSIS — Z6820 Body mass index (BMI) 20.0-20.9, adult: Secondary | ICD-10-CM | POA: Diagnosis not present

## 2020-01-03 DIAGNOSIS — R911 Solitary pulmonary nodule: Secondary | ICD-10-CM | POA: Diagnosis not present

## 2020-01-03 DIAGNOSIS — I429 Cardiomyopathy, unspecified: Secondary | ICD-10-CM | POA: Diagnosis not present

## 2020-01-03 DIAGNOSIS — R0602 Shortness of breath: Secondary | ICD-10-CM | POA: Diagnosis not present

## 2020-01-03 DIAGNOSIS — I441 Atrioventricular block, second degree: Secondary | ICD-10-CM | POA: Diagnosis not present

## 2020-01-03 DIAGNOSIS — I5021 Acute systolic (congestive) heart failure: Secondary | ICD-10-CM | POA: Diagnosis not present

## 2020-01-03 DIAGNOSIS — I10 Essential (primary) hypertension: Secondary | ICD-10-CM | POA: Diagnosis not present

## 2020-01-05 ENCOUNTER — Ambulatory Visit: Payer: Medicare Other | Attending: Internal Medicine

## 2020-01-05 DIAGNOSIS — Z23 Encounter for immunization: Secondary | ICD-10-CM

## 2020-01-05 NOTE — Progress Notes (Signed)
   Covid-19 Vaccination Clinic  Name:  DEONTAY LADNIER    MRN: [id number] DOB: 1940/06/06  01/05/2020  Mr. Ander was observed post Covid-19 immunization for 15 minutes without incident. He was provided with Vaccine Information Sheet and instruction to access the V-Safe system.   Mr. Speyer was instructed to call 911 with any severe reactions post vaccine: Marland Kitchen Difficulty breathing  . Swelling of face and throat  . A fast heartbeat  . A bad rash all over body  . Dizziness and weakness

## 2020-01-12 DIAGNOSIS — I253 Aneurysm of heart: Secondary | ICD-10-CM | POA: Diagnosis not present

## 2020-01-12 DIAGNOSIS — R911 Solitary pulmonary nodule: Secondary | ICD-10-CM | POA: Diagnosis not present

## 2020-01-12 DIAGNOSIS — I441 Atrioventricular block, second degree: Secondary | ICD-10-CM | POA: Diagnosis not present

## 2020-01-12 DIAGNOSIS — E109 Type 1 diabetes mellitus without complications: Secondary | ICD-10-CM | POA: Diagnosis not present

## 2020-01-12 DIAGNOSIS — E559 Vitamin D deficiency, unspecified: Secondary | ICD-10-CM | POA: Diagnosis not present

## 2020-01-12 DIAGNOSIS — Z6820 Body mass index (BMI) 20.0-20.9, adult: Secondary | ICD-10-CM | POA: Diagnosis not present

## 2020-01-12 DIAGNOSIS — I429 Cardiomyopathy, unspecified: Secondary | ICD-10-CM | POA: Diagnosis not present

## 2020-01-12 DIAGNOSIS — E875 Hyperkalemia: Secondary | ICD-10-CM | POA: Diagnosis not present

## 2020-01-12 DIAGNOSIS — R0602 Shortness of breath: Secondary | ICD-10-CM | POA: Diagnosis not present

## 2020-01-12 DIAGNOSIS — I5042 Chronic combined systolic (congestive) and diastolic (congestive) heart failure: Secondary | ICD-10-CM | POA: Diagnosis not present

## 2020-01-12 DIAGNOSIS — I10 Essential (primary) hypertension: Secondary | ICD-10-CM | POA: Diagnosis not present

## 2020-01-12 DIAGNOSIS — I5021 Acute systolic (congestive) heart failure: Secondary | ICD-10-CM | POA: Diagnosis not present

## 2020-01-12 DIAGNOSIS — E782 Mixed hyperlipidemia: Secondary | ICD-10-CM | POA: Diagnosis not present

## 2020-01-12 DIAGNOSIS — G589 Mononeuropathy, unspecified: Secondary | ICD-10-CM | POA: Diagnosis not present

## 2020-01-12 DIAGNOSIS — E119 Type 2 diabetes mellitus without complications: Secondary | ICD-10-CM | POA: Diagnosis not present

## 2020-02-03 ENCOUNTER — Ambulatory Visit (INDEPENDENT_AMBULATORY_CARE_PROVIDER_SITE_OTHER): Payer: Medicare Other

## 2020-02-03 DIAGNOSIS — I428 Other cardiomyopathies: Secondary | ICD-10-CM

## 2020-02-04 LAB — CUP PACEART REMOTE DEVICE CHECK
Battery Remaining Longevity: 79 mo
Battery Remaining Percentage: 88 %
Battery Voltage: 2.98 V
Brady Statistic AP VP Percent: 14 %
Brady Statistic AP VS Percent: 1 %
Brady Statistic AS VP Percent: 86 %
Brady Statistic AS VS Percent: 1 %
Brady Statistic RA Percent Paced: 14 %
Date Time Interrogation Session: 20211203010118
HighPow Impedance: 64 Ohm
Implantable Lead Implant Date: 20210305
Implantable Lead Implant Date: 20210305
Implantable Lead Implant Date: 20210305
Implantable Lead Location: 753858
Implantable Lead Location: 753859
Implantable Lead Location: 753860
Implantable Pulse Generator Implant Date: 20210305
Lead Channel Impedance Value: 460 Ohm
Lead Channel Impedance Value: 460 Ohm
Lead Channel Impedance Value: 980 Ohm
Lead Channel Pacing Threshold Amplitude: 0.75 V
Lead Channel Pacing Threshold Amplitude: 1 V
Lead Channel Pacing Threshold Amplitude: 1.875 V
Lead Channel Pacing Threshold Pulse Width: 0.5 ms
Lead Channel Pacing Threshold Pulse Width: 0.5 ms
Lead Channel Pacing Threshold Pulse Width: 0.8 ms
Lead Channel Sensing Intrinsic Amplitude: 12 mV
Lead Channel Sensing Intrinsic Amplitude: 3.6 mV
Lead Channel Setting Pacing Amplitude: 1.75 V
Lead Channel Setting Pacing Amplitude: 2 V
Lead Channel Setting Pacing Amplitude: 2.375
Lead Channel Setting Pacing Pulse Width: 0.5 ms
Lead Channel Setting Pacing Pulse Width: 0.8 ms
Lead Channel Setting Sensing Sensitivity: 0.5 mV
Pulse Gen Serial Number: 111018177

## 2020-02-08 DIAGNOSIS — Z85828 Personal history of other malignant neoplasm of skin: Secondary | ICD-10-CM | POA: Diagnosis not present

## 2020-02-08 DIAGNOSIS — L821 Other seborrheic keratosis: Secondary | ICD-10-CM | POA: Diagnosis not present

## 2020-02-08 DIAGNOSIS — L570 Actinic keratosis: Secondary | ICD-10-CM | POA: Diagnosis not present

## 2020-02-08 DIAGNOSIS — L910 Hypertrophic scar: Secondary | ICD-10-CM | POA: Diagnosis not present

## 2020-02-14 NOTE — Progress Notes (Signed)
Remote ICD transmission.   

## 2020-03-01 ENCOUNTER — Ambulatory Visit (HOSPITAL_COMMUNITY)
Admission: RE | Admit: 2020-03-01 | Discharge: 2020-03-01 | Disposition: A | Discharge status: Home / Self Care | Payer: Medicare Other | Source: Ambulatory Visit | Attending: Pulmonary Disease | Admitting: Pulmonary Disease

## 2020-03-01 ENCOUNTER — Other Ambulatory Visit: Payer: Self-pay

## 2020-03-01 DIAGNOSIS — R918 Other nonspecific abnormal finding of lung field: Secondary | ICD-10-CM | POA: Diagnosis not present

## 2020-03-01 DIAGNOSIS — I700 Atherosclerosis of aorta: Secondary | ICD-10-CM | POA: Diagnosis not present

## 2020-03-01 DIAGNOSIS — J432 Centrilobular emphysema: Secondary | ICD-10-CM | POA: Diagnosis not present

## 2020-03-01 DIAGNOSIS — I2510 Atherosclerotic heart disease of native coronary artery without angina pectoris: Secondary | ICD-10-CM | POA: Diagnosis not present

## 2020-03-01 DIAGNOSIS — J984 Other disorders of lung: Secondary | ICD-10-CM | POA: Diagnosis not present

## 2020-03-08 ENCOUNTER — Ambulatory Visit: Payer: Medicare Other | Admitting: Pulmonary Disease

## 2020-03-12 ENCOUNTER — Encounter: Payer: Self-pay | Admitting: Pulmonary Disease

## 2020-03-12 ENCOUNTER — Other Ambulatory Visit: Payer: Self-pay

## 2020-03-12 ENCOUNTER — Ambulatory Visit (INDEPENDENT_AMBULATORY_CARE_PROVIDER_SITE_OTHER): Payer: Medicare Other | Admitting: Pulmonary Disease

## 2020-03-12 VITALS — BP 114/58 | HR 70 | Temp 97.4°F | Ht 69.5 in | Wt 134.4 lb

## 2020-03-12 DIAGNOSIS — Z0000 Encounter for general adult medical examination without abnormal findings: Secondary | ICD-10-CM

## 2020-03-12 DIAGNOSIS — J432 Centrilobular emphysema: Secondary | ICD-10-CM | POA: Diagnosis not present

## 2020-03-12 DIAGNOSIS — R918 Other nonspecific abnormal finding of lung field: Secondary | ICD-10-CM | POA: Diagnosis not present

## 2020-03-12 MED ORDER — SPIRIVA RESPIMAT 2.5 MCG/ACT IN AERS: 2.0000 | INHALATION_SPRAY | Freq: Every day | RESPIRATORY_TRACT | 0 refills | Status: DC

## 2020-03-12 NOTE — Assessment & Plan Note (Signed)
Plan: Repeat CT of chest in December/2022

## 2020-03-12 NOTE — Progress Notes (Signed)
@Patient  ID: Richard Martin, male    DOB: 05-28-1940, 80 y.o.   MRN: [id number]  Chief Complaint  Patient presents with  . Follow-up    Sob-same    Referring provider: Celene Squibb, MD  HPI:  80 year old male former smoker [facility] for COPD  PMH: Ischemic cardiomyopathy, type 2 diabetes, chronic combined systolic and diastolic heart failure Smoker/ Smoking History: Former smoker.  Quit 2002. Maintenance:   Pt of: Dr. Elsworth Soho  03/12/2020  - Visit   80 year old male former smoker followed in our office for COPD.  Established with Dr. Elsworth Soho.  Patient was last seen by Dr. Eartha Inch in July/2021.  Plan of care at that point in time was for the patient to obtain pulmonary function testing dominant nodule right lower lobe was felt to be likely benign.  We will plan to do a follow-up CT scan in December/2021.  Pulmonary function testing results are listed below: 09/16/2019-pulmonary function test- FVC 3.41 (86% predicted), postbronchodilator ratio of 68, postbronchodilator FEV1 2.51 (89% addicted), no bronchodilator response, DLCO 15.13 (63% of addicted) >>>Minimal obstructive airways disease, moderate diffusion defect  03/01/2020-CT chest without contrast- dominant 8 x 13 nodular opacity in the right lung base favoring an area of nodular scarring, unchanged, continue follow-up CT chest in 1 year, additional scattered small bilateral pulmonary nodules measuring up to 5 mm, unchanged, considered likely benign   Patient reports that he continues to have ongoing dyspnea on exertion.  He feels this is stable since his last visit 6 months ago.  He does report this has worsened progressively over the last 3 to 4 years.  Patient reports he still able to physically exercise each day.  He notices that he gets more dyspneic when he is going upstairs or after walking more than half a mile.  Patient has never utilized maintenance inhalers in the past.  Patient also reports a significant exposure  history to tobacco dust as well as tobacco filters as he was in [employer].   Questionaires / Pulmonary Flowsheets:   ACT:  No flowsheet data found.  MMRC: mMRC Dyspnea Scale mMRC Score  03/12/2020 1    Epworth:  No flowsheet data found.  Tests:   FENO:  No results found for: NITRICOXIDE  PFT: PFT Results Latest Ref Rng & Units 09/16/2019  FVC-Pre L 3.41  FVC-Predicted Pre % 86  FVC-Post L 3.69  FVC-Predicted Post % 93  Pre FEV1/FVC % % 69  Post FEV1/FCV % % 68  FEV1-Pre L 2.36  FEV1-Predicted Pre % 83  FEV1-Post L 2.51  DLCO uncorrected ml/min/mmHg 15.13  DLCO UNC% % 63  DLCO corrected ml/min/mmHg 15.13  DLCO COR %Predicted % 63  DLVA Predicted % 80  TLC L 6.38  TLC % Predicted % 93  RV % Predicted % 113    WALK:  No flowsheet data found.  Imaging: CT CHEST WO CONTRAST  Result Date: 03/01/2020 CLINICAL DATA:  Follow-up pulmonary nodule EXAM: CT CHEST WITHOUT CONTRAST TECHNIQUE: Multidetector CT imaging of the chest was performed following the standard protocol without IV contrast. COMPARISON:  07/06/2019 FINDINGS: Cardiovascular: Heart is normal in size.  No pericardial effusion. No evidence of thoracic aortic aneurysm. Atherosclerotic calcifications of the aortic arch. Three vessel coronary atherosclerosis. Left subclavian ICD. Mediastinum/Nodes: Small mediastinal lymph nodes, including a 7 mm short axis subcarinal node, within normal limits. Visualized thyroid is unremarkable. Lungs/Pleura: Biapical pleural-parenchymal scarring. Moderate centrilobular and paraseptal emphysematous changes, upper lung predominant. No focal consolidation. Dominant 8  x 13 mm nodular opacity at the right lung base (series 4/image 126), favoring an area of nodular scarring, unchanged. Additional scattered small bilateral pulmonary nodules measuring up to 5 mm (series 4/images 26, 59, 61, 66, 70, 72, 95, 128, and 132), unchanged, benign. No pleural effusion or  pneumothorax. Upper Abdomen: Visualized upper abdomen is notable for scattered benign hepatic cysts and mild vascular calcifications. Musculoskeletal: Visualized osseous structures are within normal limits. IMPRESSION: Dominant 8 x 13 mm nodular opacity at the right lung base, favoring an area of nodular scarring, unchanged. Continued follow-up CT chest is suggested in 1 year. Additional scattered small bilateral pulmonary nodules measuring up to 5 mm, unchanged, likely benign. Aortic Atherosclerosis (ICD10-I70.0) and Emphysema (ICD10-J43.9). Electronically Signed   By: Julian Hy M.D.   On: 03/01/2020 09:16    Lab Results:  CBC    Component Value Date/Time   WBC 5.2 04/24/2019 2134   RBC 4.59 04/24/2019 2134   HGB 14.3 04/24/2019 2134   HGB 14.4 01/10/2019 1027   HCT 43.1 04/24/2019 2134   HCT 41.9 01/10/2019 1027   PLT 233 04/24/2019 2134   PLT 254 01/10/2019 1027   MCV 93.9 04/24/2019 2134   MCV 91 01/10/2019 1027   MCH 31.2 04/24/2019 2134   MCHC 33.2 04/24/2019 2134   RDW 12.0 04/24/2019 2134   RDW 12.1 01/10/2019 1027   LYMPHSABS 1.0 01/10/2019 1027   EOSABS 0.0 01/10/2019 1027   BASOSABS 0.1 01/10/2019 1027    BMET    Component Value Date/Time   NA 139 12/15/2019 1220   K 4.6 12/15/2019 1220   CL 100 12/15/2019 1220   CO2 26 12/15/2019 1220   GLUCOSE 166 (H) 12/15/2019 1220   GLUCOSE 214 (H) 04/24/2019 2134   BUN 15 12/15/2019 1220   CREATININE 0.62 (L) 12/15/2019 1220   CALCIUM 9.8 12/15/2019 1220   GFRNONAA 95 12/15/2019 1220   GFRAA 110 12/15/2019 1220    BNP No results found for: BNP  ProBNP No results found for: PROBNP  Specialty Problems      Pulmonary Problems   Centrilobular emphysema (HCC)   Multiple pulmonary nodules determined by computed tomography of lung      Allergies  Allergen Reactions  . Crab [Shellfish Allergy] Swelling    Immunization History  Administered Date(s) Administered  . Fluad Quad(high Dose 65+) 01/02/2020  .  Influenza-Unspecified 12/01/2017  . PFIZER SARS-COV-2 Vaccination 04/09/2019, 05/03/2019, 01/05/2020  . Tdap 12/04/2014    Past Medical History:  Diagnosis Date  . Cardiac resynchronization therapy defibrillator (CRT-D) in place    a. 05/2019 s/p SJM [facility] HF CRT-D [id number] (ser# [id number]).  . Chronic combined systolic (congestive) and diastolic (congestive) heart failure ([place])    a. 01/2019 Echo: EF 40-45%, focal basal inf aneurysmal region. Gr2 DD. Nl RV fxn. Mildly dil RA. Mod TR. Triv PR; b. 02/2019 cMRI: EF 35%.  . Diabetes mellitus without complication ([place])   . LBBB (left bundle branch block)   . NICM (nonischemic cardiomyopathy) ([place])    a. 01/2019 Echo: EF 40-45%; b. 01/2019 Cath: nl cors. EF 35-45%; c. 02/2019 cMRI: Basal inf and basal infsept midwall LGE. EF 35%; d. 05/2019 s/p SJM [facility] HF CRT-D [id number] (ser# [id number]).    Tobacco History: Social History   Tobacco Use  Smoking Status Former Smoker  . Types: Cigarettes  . Quit date: 2002  . Years since quitting: 20.0  Smokeless Tobacco Never Used  Tobacco Comment   Quit 2002   Counseling  given: Not Answered Comment: Quit 2002   Continue to not smoke  Outpatient Encounter Medications as of 03/12/2020  Medication Sig  . acetaminophen (TYLENOL) 500 MG tablet Take 500 mg by mouth daily as needed for moderate pain or headache.  Marland Kitchen aspirin EC 81 MG tablet Take 81 mg by mouth daily.   . carvedilol (COREG) 12.5 MG tablet Take 1 tablet (12.5 mg total) by mouth 2 (two) times daily with a meal.  . cholecalciferol (VITAMIN D3) 25 MCG (1000 UT) tablet Take 1,000 Units by mouth daily.  Marland Kitchen FARXIGA 10 MG TABS tablet Take 10 mg by mouth daily. Take 1 tablet by mouth every day  . lisinopril (ZESTRIL) 20 MG tablet TAKE (1) TABLET BY MOUTH ONCE DAILY.  . metFORMIN (GLUCOPHAGE) 500 MG tablet Take 1 tablet (500 mg total) by mouth 2 (two) times daily.  . rosuvastatin (CRESTOR) 10 MG tablet Take 10 mg by mouth daily.   .  Tiotropium Bromide Monohydrate (SPIRIVA RESPIMAT) 2.5 MCG/ACT AERS Inhale 2 puffs into the lungs daily.  Marland Kitchen zinc gluconate 50 MG tablet Take 50 mg by mouth daily.   No facility-administered encounter medications on file as of 03/12/2020.     Review of Systems  Review of Systems  Constitutional: Positive for fatigue. Negative for activity change, chills, fever and unexpected weight change.  HENT: Negative for congestion, postnasal drip, rhinorrhea, sinus pressure, sinus pain and sore throat.   Eyes: Negative.   Respiratory: Positive for shortness of breath. Negative for cough and wheezing.   Cardiovascular: Negative for chest pain and palpitations.  Gastrointestinal: Negative for constipation, diarrhea, nausea and vomiting.  Endocrine: Negative.   Genitourinary: Negative.   Musculoskeletal: Negative.   Skin: Negative.   Neurological: Negative for dizziness and headaches.  Psychiatric/Behavioral: Negative.  Negative for dysphoric mood. The patient is not nervous/anxious.   All other systems reviewed and are negative.    Physical Exam  BP (!) 114/58 (BP Location: Left Arm, Cuff Size: Normal)   Pulse 70   Temp (!) 97.4 F (36.3 C) (Temporal)   Ht 5' 9.5" (1.765 m)   Wt 134 lb 6.4 oz (61 kg)   SpO2 100%   BMI 19.56 kg/m   Wt Readings from Last 5 Encounters:  03/12/20 134 lb 6.4 oz (61 kg)  12/15/19 134 lb 6.4 oz (61 kg)  09/12/19 139 lb 6.4 oz (63.2 kg)  09/06/19 138 lb 12.8 oz (63 kg)  08/09/19 139 lb 12.8 oz (63.4 kg)    BMI Readings from Last 5 Encounters:  03/12/20 19.56 kg/m  12/15/19 19.85 kg/m  09/12/19 20.59 kg/m  09/06/19 20.50 kg/m  08/09/19 21.26 kg/m     Physical Exam Vitals and nursing note reviewed.  Constitutional:      General: He is not in acute distress.    Appearance: Normal appearance. He is normal weight.  HENT:     Head: Normocephalic and atraumatic.     Right Ear: Hearing and external ear normal.     Left Ear: Hearing and external ear  normal.     Nose: Nose normal. No mucosal edema or rhinorrhea.     Right Turbinates: Not enlarged.     Left Turbinates: Not enlarged.     Mouth/Throat:     Mouth: Mucous membranes are dry.     Pharynx: Oropharynx is clear. No oropharyngeal exudate.  Eyes:     Pupils: Pupils are equal, round, and reactive to light.  Cardiovascular:     Rate and Rhythm:  Normal rate and regular rhythm.     Pulses: Normal pulses.     Heart sounds: Normal heart sounds. No murmur heard.   Pulmonary:     Effort: Pulmonary effort is normal.     Breath sounds: Rales (Bibasilar crackles) present. No decreased breath sounds or wheezing.  Musculoskeletal:     Cervical back: Normal range of motion.     Right lower leg: No edema.     Left lower leg: No edema.  Lymphadenopathy:     Cervical: No cervical adenopathy.  Skin:    General: Skin is warm and dry.     Capillary Refill: Capillary refill takes less than 2 seconds.     Findings: No erythema or rash.  Neurological:     General: No focal deficit present.     Mental Status: He is alert and oriented to person, place, and time.     Motor: No weakness.     Coordination: Coordination normal.     Gait: Gait is intact. Gait normal.  Psychiatric:        Mood and Affect: Mood normal.        Behavior: Behavior normal. Behavior is cooperative.        Thought Content: Thought content normal.        Judgment: Judgment normal.       Assessment & Plan:   Centrilobular emphysema ([place]) Reviewed 2021 pulmonary function testing Reviewed 2021 CT chest Discussed pathophysiology of COPD  Plan: Trial of Spiriva Respimat 2.5 Continue to remain physically active Up-to-date with COVID-19 vaccinations Follow-up in 6 months Repeat CT of chest in [place] maintenance Up-to-date with recommended vaccinations  Abnormal findings on diagnostic imaging of lung Stable known nodules Radiology recommending 1 year follow-up   Plan: Repeat CT of chest  in December/2022   Multiple pulmonary nodules determined by computed tomography of lung Plan: Repeat CT of chest in December/2022    Return in about 6 months (around 09/09/2020), or if symptoms worsen or fail to improve, for Follow up with Dr. Elsworth Soho.   Lauraine Rinne, NP 03/12/2020   This appointment required 35 minutes of patient care (this includes precharting, chart review, review of results, face-to-face care, etc.).

## 2020-03-12 NOTE — Assessment & Plan Note (Signed)
Up-to-date with recommended vaccinations

## 2020-03-12 NOTE — Patient Instructions (Addendum)
You were seen today by Lauraine Rinne, NP  for:   1. Centrilobular emphysema (HCC)  Trial Spiriva Respimat 2.5 >>> 2 puffs daily >>> Do this every day >>>This is not a rescue inhaler  Call us or send a mychart message to update [place] on how you are doing when you finish the first sample in 2 weeks   Note your daily symptoms > remember "red flags" for COPD:   >>>Increase in cough >>>increase in sputum production >>>increase in shortness of breath or activity  intolerance.   If you notice these symptoms, please call the office to be seen.    2. Abnormal findings on diagnostic imaging of lung 3. Multiple pulmonary nodules determined by computed tomography of lung  - CT Chest Wo Contrast; Future  We will repeat a CT of your chest in 1 year - Dec/2022  4. Healthcare maintenance  Great job being up to date with covid19 vaccines, flu, and pneumovax23   We recommend today:  Orders Placed This Encounter  Procedures  . CT Chest Wo Contrast    Standing Status:   Future    Standing Expiration Date:   03/12/2021    Scheduling Instructions:     Schedule 03/01/21 around    Order Specific Question:   Preferred imaging location?    Answer:   [facility]   Orders Placed This Encounter  Procedures  . CT Chest Wo Contrast   No orders of the defined types were placed in this encounter.   Follow Up:    Return in about 6 months (around 09/09/2020), or if symptoms worsen or fail to improve, for Follow up with Dr. Elsworth Soho.   Notification of test results are managed in the following manner: If there are  any recommendations or changes to the  plan of care discussed in office today,  we will contact you and let you know what they are. If you do not hear from [place], then your results are normal and you can view them through your  MyChart account , or a letter will be sent to you. Thank you again for trusting [place] with your care  - Thank you, [facility]    It is flu season:   >>>  Best ways to protect herself from the flu: Receive the yearly flu vaccine, practice good hand hygiene washing with soap and also using hand sanitizer when available, eat a nutritious meals, get adequate rest, hydrate appropriately       Please contact the office if your symptoms worsen or you have concerns that you are not improving.   Thank you for choosing [facility] for your healthcare, and for allowing [place] to partner with you on your healthcare journey. I am thankful to be able to provide care to you today.   Wyn Quaker FNP-C

## 2020-03-12 NOTE — Assessment & Plan Note (Signed)
Stable known nodules Radiology recommending 1 year follow-up   Plan: Repeat CT of chest in December/2022

## 2020-03-12 NOTE — Assessment & Plan Note (Signed)
Reviewed 2021 pulmonary function testing Reviewed 2021 CT chest Discussed pathophysiology of COPD  Plan: Trial of Spiriva Respimat 2.5 Continue to remain physically active Up-to-date with COVID-19 vaccinations Follow-up in 6 months Repeat CT of chest in December/2022

## 2020-03-30 IMAGING — MR MR CARD MORPHOLOGY WO/W CM
34 of 36 series · 36 of 40 positions shown · IV contrast (Contrast agent)
Comparison: none

CLINICAL DATA: 77M presented with syncope and found to have Mobitz
2 AV block. TTE shows basal inferior aneurysm. Normal coronaries.

EXAM:
CARDIAC MRI
TECHNIQUE: The patient was scanned on a 1.5 Tesla Siemens magnet. A dedicated
cardiac coil was used. Functional imaging was done using Fiesta
sequences. [DATE], and 4 chamber views were done to assess for RWMA's.
Modified Abou Rjeily rule using a short axis stack was used to
calculate an ejection fraction on a dedicated work station using
Circle software. The patient received 6 cc of Gadavist. After 10
minutes inversion recovery sequences were used to assess for
infiltration and scar tissue.
CONTRAST:  6 cc  of Gadavist

[Series 6: bSSFP · oblique · 8.0mm · 1.61mm/px · 1 of 25 slices shown (1 of 23)]
[im 1/25]
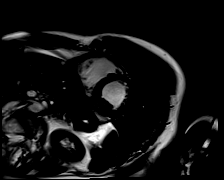

[Series 6: bSSFP · oblique · 8.0mm · 1.61mm/px · 1 of 25 slices shown (2 of 23)]
[im 1/25]
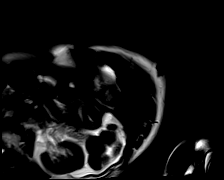

[Series 6: bSSFP · oblique · 8.0mm · 1.61mm/px · 1 of 25 slices shown (3 of 23)]
[im 1/25]
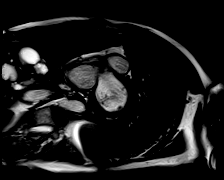

[Series 6: bSSFP · oblique · 8.0mm · 1.61mm/px · 1 of 25 slices shown (4 of 23)]
[im 1/25]
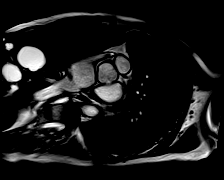

[Series 6: bSSFP · oblique · 8.0mm · 1.61mm/px · 1 of 25 slices shown (5 of 23)]
[im 1/25]
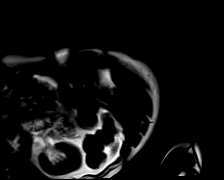

[Series 6: bSSFP · oblique · 8.0mm · 1.61mm/px · 1 of 25 slices shown (6 of 23)]
[im 1/25]
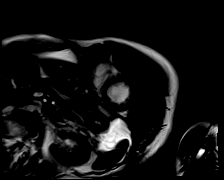

[Series 6: bSSFP · oblique · 8.0mm · 1.61mm/px · 1 of 25 slices shown (7 of 23)]
[im 1/25]
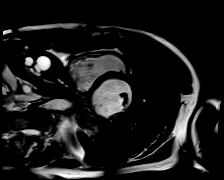

[Series 6: bSSFP · oblique · 8.0mm · 1.61mm/px · 1 of 25 slices shown (8 of 23)]
[im 1/25]
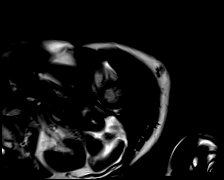

[Series 6: bSSFP · oblique · 8.0mm · 1.61mm/px · 1 of 25 slices shown (9 of 23)]
[im 1/25]
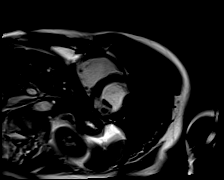

[Series 6: bSSFP · oblique · 8.0mm · 1.61mm/px · 2 of 25 slices shown (10 of 23)]
[im 1/25]
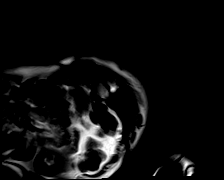
[im 25/25]
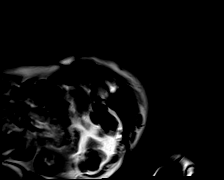

[Series 6: bSSFP · oblique · 8.0mm · 1.61mm/px · 1 of 25 slices shown (11 of 23)]
[im 1/25]
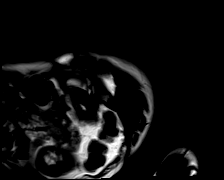

[Series 6: bSSFP · oblique · 8.0mm · 1.61mm/px · 1 of 25 slices shown (12 of 23)]
[im 1/25]
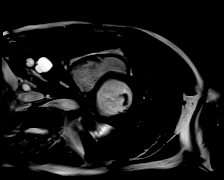

[Series 6: bSSFP · oblique · 8.0mm · 1.61mm/px · 1 of 25 slices shown (13 of 23)]
[im 1/25]
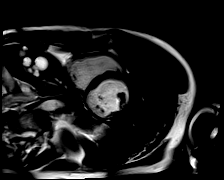

[Series 6: bSSFP · oblique · 8.0mm · 1.61mm/px · 2 of 25 slices shown (14 of 23)]
[im 1/25]
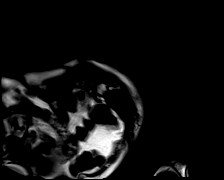
[im 25/25]
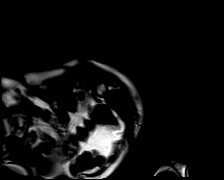

[Series 6: bSSFP · oblique · 8.0mm · 1.61mm/px · 1 of 25 slices shown (15 of 23)]
[im 1/25]
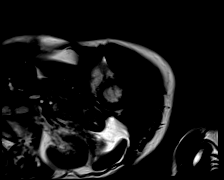

[Series 6: bSSFP · oblique · 8.0mm · 1.61mm/px · 1 of 25 slices shown (16 of 23)]
[im 1/25]
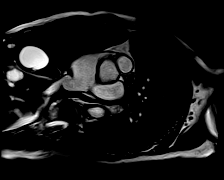

[Series 6: bSSFP · oblique · 8.0mm · 1.61mm/px · 1 of 25 slices shown (17 of 23)]
[im 1/25]
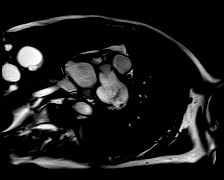

[Series 7: bSSFP · oblique · 8.0mm · 1.61mm/px · 1 of 25 slices shown (18 of 23)]
[im 1/25]
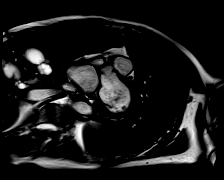

[Series 7: bSSFP · oblique · 8.0mm · 1.61mm/px · 1 of 25 slices shown (19 of 23)]
[im 1/25]
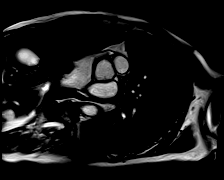

[Series 7: bSSFP · oblique · 8.0mm · 1.61mm/px · 1 of 25 slices shown (20 of 23)]
[im 1/25]
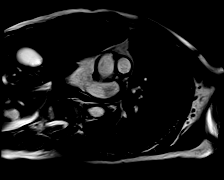

[Series 8: t2_stir_db_sax · oblique · 8.0mm · 1.73mm/px · 1 of 17 slices shown]
[im 1/17]
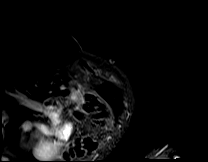

[Series 18: (id)_long_t1 · oblique · 8.0mm · 1.41mm/px · 1 of 24 slices shown]
[im 1/24]
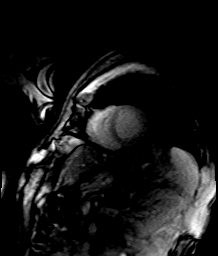

[Series 19: (id)_long_t1_moco · oblique · 8.0mm · 1.41mm/px · 1 of 24 slices shown]
[im 1/24]
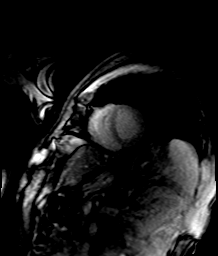

[Series 22: (id)_trufi · oblique · 8.0mm · 1.88mm/px · 1 of 9 slices shown]
[im 1/9]
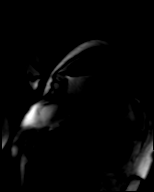

[Series 23: (id)_trufi_moco · oblique · 8.0mm · 1.88mm/px · 1 of 9 slices shown]
[im 1/9]
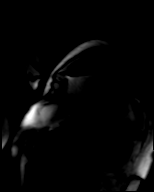

[Series 24: (id)_trufi_moco_t2 · oblique · 8.0mm · 1.88mm/px · 1 of 3 slices shown]
[im 1/3]
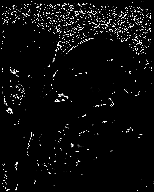

[Series 26: bSSFP · oblique · 6.0mm · 1.33mm/px · 1 of 25 slices shown (21 of 23)]
[im 1/25]
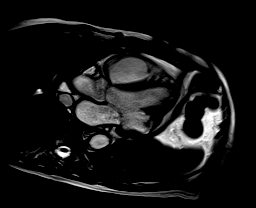

[Series 27: bSSFP · oblique · 6.0mm · 1.33mm/px · 1 of 25 slices shown (22 of 23)]
[im 1/25]
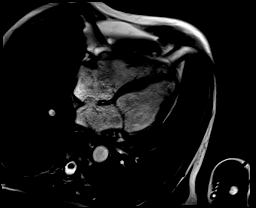

[Series 28: bSSFP · oblique · 6.0mm · 1.33mm/px · 1 of 25 slices shown (23 of 23)]
[im 1/25]
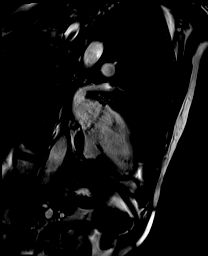

[Series 38: (id)_short_t1 · oblique · 8.0mm · 1.41mm/px · 1 of 27 slices shown]
[im 1/27]
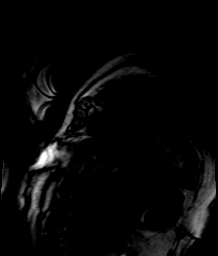

[Series 39: (id)_short_t1_moco · oblique · 8.0mm · 1.41mm/px · 1 of 27 slices shown]
[im 1/27]
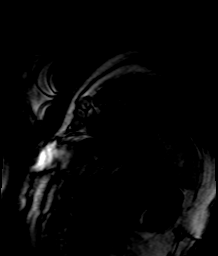

[Series 40: (id)_short_t1_moco_t1 · oblique · 8.0mm · 1.41mm/px · 1 of 6 slices shown]
[im 1/6]
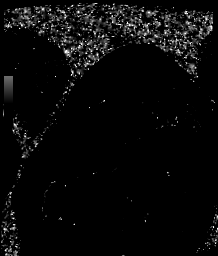

[Series 50: lge short axis_mag · oblique · 8.0mm · 1.61mm/px · 1 of 17 slices shown]
[im 1/17]
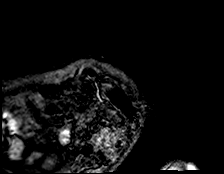

[Series 51: lge short axis_psir · oblique · 8.0mm · 1.61mm/px · 1 of 17 slices shown]
[im 1/17]
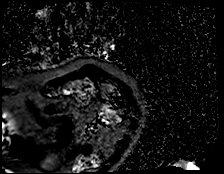

[36 of 40 positions shown; findings below may reference images not displayed]

FINDINGS: Left ventricle:

LV EF: 35% (Normal 56-78%)

- Mild dilatation

- Moderate systolic dysfunction.  Basal inferior aneurysm

- Normal ECV (24%)

- Transmural basal inferior LGE.  Basal inferoseptal midwall LGE.

Absolute volumes:

LV EDV: 166mL (Normal 77-195 mL)

LV ESV: 107mL (Normal 19-72 mL)

LV SV: 58mL (Normal 51-133 mL)

CO: 3.7L/min (Normal 2.8-8.8 L/min)

Indexed volumes:

LV EDV: 94mL/sq-m (Normal 47-92 mL/sq-m)

LV ESV: 61mL/sq-m (Normal 13-30 mL/sq-m)

LV SV: 33mL/sq-m (Normal 32-62 mL/sq-m)

CI: 2.1L/min/sq-m (Normal 1.7-4.2 L/min/sq-m)

Right ventricle: Normal size and systolic function

RV EF:  62% (Normal 47-74%)

Absolute volumes:

RV EDV: 149mL (Normal 88-227 mL)

RV ESV: 57mL (Normal 23-103 mL)

RV SV: 92mL (Normal 52-138 mL)

CO: 5.8L/min (Normal 2.8-8.8 L/min)

Indexed volumes:

RV EDV: 85mL/sq-m (Normal 55-105 mL/sq-m)

RV ESV: 32mL/sq-m (Normal 15-43 mL/sq-m)

RV SV: 52mL/sq-m (Normal 32-64 mL/sq-m)

CI: 3.3L/min/sq-m (Normal 1.7-4.2 L/min/sq-m)

Left atrium: Mild enlargement

Right atrium: Mild enlargement

Mitral valve: No regurgitation

Aortic valve: No regurgitation

Tricuspid valve: Moderate regurgitation

Pericardium: Normal
IMPRESSION: 1. Transmural basal inferior LGE and basal inferoseptal midwall LGE.
While transmural LGE suggests ischemic etiology, it is not in a
coronary distribution and patient had normal coronaries on recent
cath. Sarcoidosis can also present with transmural LGE, and
considering the basal inferoseptal midwall LGE and presentation with
Mobitz 2 AV block, suspect sarcoidosis.

2. Mild LV dilatation with moderate systolic dysfunction (EF 35%).
Basal inferior aneurysm

3.  Normal RV size and systolic function (EF 62%)

## 2020-04-13 DIAGNOSIS — I5021 Acute systolic (congestive) heart failure: Secondary | ICD-10-CM | POA: Diagnosis not present

## 2020-04-13 DIAGNOSIS — I429 Cardiomyopathy, unspecified: Secondary | ICD-10-CM | POA: Diagnosis not present

## 2020-04-13 DIAGNOSIS — E119 Type 2 diabetes mellitus without complications: Secondary | ICD-10-CM | POA: Diagnosis not present

## 2020-04-13 DIAGNOSIS — Z6820 Body mass index (BMI) 20.0-20.9, adult: Secondary | ICD-10-CM | POA: Diagnosis not present

## 2020-04-13 DIAGNOSIS — I10 Essential (primary) hypertension: Secondary | ICD-10-CM | POA: Diagnosis not present

## 2020-04-13 DIAGNOSIS — E109 Type 1 diabetes mellitus without complications: Secondary | ICD-10-CM | POA: Diagnosis not present

## 2020-04-13 DIAGNOSIS — R0602 Shortness of breath: Secondary | ICD-10-CM | POA: Diagnosis not present

## 2020-04-13 DIAGNOSIS — G589 Mononeuropathy, unspecified: Secondary | ICD-10-CM | POA: Diagnosis not present

## 2020-04-13 DIAGNOSIS — E559 Vitamin D deficiency, unspecified: Secondary | ICD-10-CM | POA: Diagnosis not present

## 2020-04-13 DIAGNOSIS — I253 Aneurysm of heart: Secondary | ICD-10-CM | POA: Diagnosis not present

## 2020-04-13 DIAGNOSIS — I441 Atrioventricular block, second degree: Secondary | ICD-10-CM | POA: Diagnosis not present

## 2020-04-13 DIAGNOSIS — R911 Solitary pulmonary nodule: Secondary | ICD-10-CM | POA: Diagnosis not present

## 2020-04-17 DIAGNOSIS — L821 Other seborrheic keratosis: Secondary | ICD-10-CM | POA: Diagnosis not present

## 2020-04-17 DIAGNOSIS — Z85828 Personal history of other malignant neoplasm of skin: Secondary | ICD-10-CM | POA: Diagnosis not present

## 2020-04-17 DIAGNOSIS — D1801 Hemangioma of skin and subcutaneous tissue: Secondary | ICD-10-CM | POA: Diagnosis not present

## 2020-04-17 DIAGNOSIS — L570 Actinic keratosis: Secondary | ICD-10-CM | POA: Diagnosis not present

## 2020-04-17 DIAGNOSIS — D225 Melanocytic nevi of trunk: Secondary | ICD-10-CM | POA: Diagnosis not present

## 2020-04-19 DIAGNOSIS — E782 Mixed hyperlipidemia: Secondary | ICD-10-CM | POA: Diagnosis not present

## 2020-04-19 DIAGNOSIS — E119 Type 2 diabetes mellitus without complications: Secondary | ICD-10-CM | POA: Diagnosis not present

## 2020-04-19 DIAGNOSIS — I253 Aneurysm of heart: Secondary | ICD-10-CM | POA: Diagnosis not present

## 2020-04-19 DIAGNOSIS — R911 Solitary pulmonary nodule: Secondary | ICD-10-CM | POA: Diagnosis not present

## 2020-04-19 DIAGNOSIS — E875 Hyperkalemia: Secondary | ICD-10-CM | POA: Diagnosis not present

## 2020-04-19 DIAGNOSIS — E559 Vitamin D deficiency, unspecified: Secondary | ICD-10-CM | POA: Diagnosis not present

## 2020-04-19 DIAGNOSIS — R0602 Shortness of breath: Secondary | ICD-10-CM | POA: Diagnosis not present

## 2020-04-19 DIAGNOSIS — I441 Atrioventricular block, second degree: Secondary | ICD-10-CM | POA: Diagnosis not present

## 2020-04-19 DIAGNOSIS — I429 Cardiomyopathy, unspecified: Secondary | ICD-10-CM | POA: Diagnosis not present

## 2020-04-19 DIAGNOSIS — I5042 Chronic combined systolic (congestive) and diastolic (congestive) heart failure: Secondary | ICD-10-CM | POA: Diagnosis not present

## 2020-05-04 ENCOUNTER — Ambulatory Visit (INDEPENDENT_AMBULATORY_CARE_PROVIDER_SITE_OTHER): Payer: Medicare Other

## 2020-05-04 DIAGNOSIS — I428 Other cardiomyopathies: Secondary | ICD-10-CM | POA: Diagnosis not present

## 2020-05-07 LAB — CUP PACEART REMOTE DEVICE CHECK
Battery Remaining Longevity: 78 mo
Battery Remaining Percentage: 86 %
Battery Voltage: 2.96 V
Brady Statistic AP VP Percent: 13 %
Brady Statistic AP VS Percent: 1 %
Brady Statistic AS VP Percent: 87 %
Brady Statistic AS VS Percent: 1 %
Brady Statistic RA Percent Paced: 13 %
Date Time Interrogation Session: 20220304052030
HighPow Impedance: 60 Ohm
Implantable Lead Implant Date: 20210305
Implantable Lead Implant Date: 20210305
Implantable Lead Implant Date: 20210305
Implantable Lead Location: 753858
Implantable Lead Location: 753859
Implantable Lead Location: 753860
Implantable Pulse Generator Implant Date: 20210305
Lead Channel Impedance Value: 1075 Ohm
Lead Channel Impedance Value: 450 Ohm
Lead Channel Impedance Value: 480 Ohm
Lead Channel Pacing Threshold Amplitude: 0.75 V
Lead Channel Pacing Threshold Amplitude: 1 V
Lead Channel Pacing Threshold Amplitude: 1.875 V
Lead Channel Pacing Threshold Pulse Width: 0.5 ms
Lead Channel Pacing Threshold Pulse Width: 0.5 ms
Lead Channel Pacing Threshold Pulse Width: 0.8 ms
Lead Channel Sensing Intrinsic Amplitude: 12 mV
Lead Channel Sensing Intrinsic Amplitude: 4.1 mV
Lead Channel Setting Pacing Amplitude: 1.75 V
Lead Channel Setting Pacing Amplitude: 2 V
Lead Channel Setting Pacing Amplitude: 2.375
Lead Channel Setting Pacing Pulse Width: 0.5 ms
Lead Channel Setting Pacing Pulse Width: 0.8 ms
Lead Channel Setting Sensing Sensitivity: 0.5 mV
Pulse Gen Serial Number: 111018177

## 2020-05-15 NOTE — Progress Notes (Signed)
Remote ICD transmission.   

## 2020-05-29 ENCOUNTER — Other Ambulatory Visit: Payer: Self-pay

## 2020-05-29 ENCOUNTER — Ambulatory Visit (INDEPENDENT_AMBULATORY_CARE_PROVIDER_SITE_OTHER): Payer: Medicare Other | Admitting: Cardiology

## 2020-05-29 ENCOUNTER — Encounter: Payer: Self-pay | Admitting: Cardiology

## 2020-05-29 DIAGNOSIS — I255 Ischemic cardiomyopathy: Secondary | ICD-10-CM

## 2020-05-29 NOTE — Progress Notes (Signed)
Electrophysiology Office Note   Date:  05/29/2020   ID:  Richard, Martin January 03, 1941, MRN [id number]  PCP:  Celene Squibb, MD  Cardiologist: Gardiner Rhyme Primary Electrophysiologist:  Obert Espindola [name], MD    Chief Complaint: bradycardia   History of Present Illness: Richard Martin is a 80 y.o. male who is being seen today for the evaluation of 2:1 AV block at the request of Celene Squibb, MD. Presenting today for electrophysiology evaluation.  He has a history significant for diabetes, systolic heart failure, and 2-1 AV block.  He has sudden loss of consciousness while walking through the kitchen.  He was only unconscious for a few seconds.  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  TTE showed an ejection fraction of 40 to 45% with aneurysmal inferior segment.  Left heart catheterization showed no coronary artery disease.  Cardiac MRI showed an ejection fraction of 35%.  He is status post [place] CRT-D implanted 05/06/2019.  Today, denies symptoms of palpitations, chest pain, shortness of breath, orthopnea, PND, lower extremity edema, claudication, dizziness, presyncope, syncope, bleeding, or neurologic sequela. The patient is tolerating medications without difficulties.  He currently feels well.  He is without chest pain or shortness of breath.  He is able do all his daily activities without restriction.  Past Medical History:  Diagnosis Date  . Cardiac resynchronization therapy defibrillator (CRT-D) in place    a. 05/2019 s/p SJM [facility] HF CRT-D [id number] (ser# [id number]).  . Chronic combined systolic (congestive) and diastolic (congestive) heart failure ([place])    a. 01/2019 Echo: EF 40-45%, focal basal inf aneurysmal region. Gr2 DD. Nl RV fxn. Mildly dil RA. Mod TR. Triv PR; b. 02/2019 cMRI: EF 35%.  . Diabetes mellitus without complication ([place])   . LBBB (left bundle branch block)   . NICM (nonischemic cardiomyopathy) ([place])    a. 01/2019 Echo: EF 40-45%; b.  01/2019 Cath: nl cors. EF 35-45%; c. 02/2019 cMRI: Basal inf and basal infsept midwall LGE. EF 35%; d. 05/2019 s/p SJM [facility] HF CRT-D [id number] (ser# [id number]).   Past Surgical History:  Procedure Laterality Date  . BIV ICD INSERTION CRT-D N/A 05/06/2019   Procedure: BIV ICD INSERTION CRT-D;  Surgeon: Constance Haw, MD;  Location: [facility];  Service: Cardiovascular;  Laterality: N/A;  . LEFT HEART CATH AND CORONARY ANGIOGRAPHY N/A 01/13/2019   Procedure: LEFT HEART CATH AND CORONARY ANGIOGRAPHY;  Surgeon: Martinique, Peter M, MD;  Location: [place] [facility];  Service: Cardiovascular;  Laterality: N/A;     Current Outpatient Medications  Medication Sig Dispense Refill  . acetaminophen (TYLENOL) 500 MG tablet Take 500 mg by mouth daily as needed for moderate pain or headache.    Marland Kitchen aspirin EC 81 MG tablet Take 81 mg by mouth daily.     . carvedilol (COREG) 12.5 MG tablet Take 1 tablet (12.5 mg total) by mouth 2 (two) times daily with a meal. 180 tablet 3  . cholecalciferol (VITAMIN D3) 25 MCG (1000 UT) tablet Take 1,000 Units by mouth daily.    Marland Kitchen FARXIGA 10 MG TABS tablet Take 10 mg by mouth daily. Take 1 tablet by mouth every day    . lisinopril (ZESTRIL) 20 MG tablet TAKE (1) TABLET BY MOUTH ONCE DAILY. 90 tablet 3  . metFORMIN (GLUCOPHAGE) 500 MG tablet Take 1 tablet (500 mg total) by mouth 2 (two) times daily.    . rosuvastatin (CRESTOR) 10 MG tablet Take  10 mg by mouth daily.     . Tiotropium Bromide Monohydrate (SPIRIVA RESPIMAT) 2.5 MCG/ACT AERS Inhale 2 puffs into the lungs daily. 4 g 0  . zinc gluconate 50 MG tablet Take 50 mg by mouth daily.     No current facility-administered medications for this visit.    Allergies:   Crab [shellfish allergy]   Social History:  The patient  reports that he quit smoking about 20 years ago. His smoking use included cigarettes. He has never used smokeless tobacco. He reports that he does not drink alcohol and does not use drugs.    Family History:  The patient's family history includes Aneurysm in his mother; Heart attack in his father.   ROS:  Please see the history of present illness.   Otherwise, review of systems is positive for none.   All other systems are reviewed and negative.   PHYSICAL EXAM: VS:  BP 122/64   Pulse 71   Ht 5\' 9"  (1.753 m)   Wt 134 lb (60.8 kg)   BMI 19.79 kg/m  , BMI Body mass index is 19.79 kg/m. GEN: Well nourished, well developed, in no acute distress  HEENT: normal  Neck: no JVD, carotid bruits, or masses Cardiac: RRR; no murmurs, rubs, or gallops,no edema  Respiratory:  clear to auscultation bilaterally, normal work of breathing GI: soft, nontender, nondistended, + BS MS: no deformity or atrophy  Skin: warm and dry, device site well healed Neuro:  Strength and sensation are intact Psych: euthymic mood, full affect  EKG:  EKG is ordered today. Personal review of the ekg ordered shows sinus rhythm, ventricular paced  Personal review of the device interrogation today. Results in [place]: 12/15/2019: BUN 15; Creatinine, Ser 0.62; Potassium 4.6; Sodium 139    Lipid Panel     Component Value Date/Time   CHOL 123 01/10/2019 1027   TRIG 66 01/10/2019 1027   HDL 52 01/10/2019 1027   CHOLHDL 2.4 01/10/2019 1027   LDLCALC 57 01/10/2019 1027     Wt Readings from Last 3 Encounters:  05/29/20 134 lb (60.8 kg)  03/12/20 134 lb 6.4 oz (61 kg)  12/15/19 134 lb 6.4 oz (61 kg)      Other studies Reviewed: Additional studies/ records that were reviewed today include: CMRI 02/10/19  Review of the above records today demonstrates:  1. 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 While transmural LGE suggests ischemic etiology, it is not in a coronary distribution and patient had normal coronaries on recent cath. 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  2. Mild LV dilatation with moderate systolic dysfunction (EF 35%). Basal inferior aneurysm  3.  Normal RV size and systolic function (EF 45%) LHC 01/13/19 1. Normal coronary anatomy 2. Moderate LV dysfunction 3. inferobasal aneurysm 4. Normal LVEDP  ASSESSMENT AND PLAN:  1.  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  Device functioning appropriately.  Feels improved now the device has been implanted.   2.  Chronic systolic heart failure: Currently on lisinopril, carvedilol, Aldactone.  Status post [place] [facility] CRT-D implanted 05/06/2019.  Device functioning appropriately.  No obvious volume overload.  No changes.    Current medicines are reviewed at length with the patient today.   The patient does not have concerns regarding his medicines.  The following changes were made  today: None  Labs/ tests ordered today include:  Orders Placed This Encounter  Procedures  . EKG 12-Lead    Disposition:   FU with Johnpatrick Jenny 12 months  Signed, Reda Citron [name], MD  05/29/2020 4:42 PM     [place] [address] [phone] (office) [phone] (fax)

## 2020-05-29 NOTE — Patient Instructions (Signed)
Medication Instructions:  Your physician recommends that you continue on your current medications as directed. Please refer to the Current Medication list given to you today.  *If you need a refill on your cardiac medications before your next appointment, please call your pharmacy*   Lab Work: None ordered   Testing/Procedures: None ordered   Follow-Up: At [facility], you and your health needs are our priority.  As part of our continuing mission to provide you with exceptional heart care, we have created designated Provider Care Teams.  These Care Teams include your primary Cardiologist (physician) and Advanced Practice Providers (APPs -  Physician Assistants and Nurse Practitioners) who all work together to provide you with the care you need, when you need it.  Remote monitoring is used to monitor your Pacemaker or ICD from home. This monitoring reduces the number of office visits required to check your device to one time per year. It allows [place] to keep an eye on the functioning of your device to ensure it is working properly. You are scheduled for a device check from home on 08/03/2020. You may send your transmission at any time that day. If you have a wireless device, the transmission will be sent automatically. After your physician reviews your transmission, you will receive a postcard with your next transmission date.  Your next appointment:   1 year(s)  The format for your next appointment:   In Person  Provider:   Allegra Lai, MD   Thank you for choosing [facility]!!   Trinidad Curet, RN [phone]

## 2020-07-03 NOTE — Progress Notes (Signed)
Cardiology Office Note:    Date:  07/04/2020   ID:  Richard Martin, DOB June 23, 1940, MRN [id number]  PCP:  Celene Squibb, MD  Cardiologist:  None  Electrophysiologist:  Will [name], MD   Referring MD: Celene Squibb, MD   Chief Complaint  Patient presents with  . Follow-up    6 months.  . Congestive Heart Failure    History of Present Illness:    Richard Martin is a 80 y.o. male with a hx of diabetes, chronic combined systolic and diastolic heart failure and second-degree Mobitz 2 AV block presents for follow-up.  He was initially seen on 12/31/2018 after being referred by Dr. Nevada Crane for evaluation of EKG abnormalities and syncopal episode.  He had a episode of syncope where he was walking to his kitchen and had sudden loss of consciousness.  He was seen in clinic on 12/31/2018, and rhythm strip demonstrated intermittent 2:1 AV block, with a wide QRS.  He was referred to EP for evaluation for pacemaker, and an echocardiogram was ordered.  TTE was done on 01/04/2019, which showed EF 40 to 45%, with aneurysmal basal inferior segment.  TTE was also notable for grade 2 diastolic dysfunction, normal RV function, no significant valvular disease.  Given new systolic dysfunction, cardiac catheterization was done on 01/13/2019, which showed normal coronary arteries.  Cardiac MRI was done on 02/11/2019, which showed EF 35%, basal inferior aneurysm, transmural basal inferior LGE, basal inferoseptal mid wall LGE.  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  Zio patch x14 days was completed, which showed frequent second-degree Mobitz 2 AV block, with symptoms of dizziness corresponding to Mobitz 2 AV block.  He underwent cardiac PET at [facility] on 04/01/2019, which showed no active myocardial inflammation.  He underwent BiV ICD placement by Dr. Curt Bears on 05/06/2019.  Since CRT-D placed, reports lightheadedness/syncope has resolved and dyspnea/fatigue significantly improved.  Since last clinic visit, he  reports that he is doing well.  Denies any chest pain, dyspnea, lower extremity edema, or palpitations.  Does report that he has been having lightheadedness with standing.  1 episode last fall where he stood up and started walking too fast and passed out.  States that if he gets low he does not have any issues.  He has been walking 2 to 3 days/week.  Denies exertional symptoms.  Main complaint is decreased energy.   Wt Readings from Last 3 Encounters:  07/04/20 137 lb (62.1 kg)  05/29/20 134 lb (60.8 kg)  03/12/20 134 lb 6.4 oz (61 kg)   BP Readings from Last 3 Encounters:  07/04/20 122/60  05/29/20 122/64  03/12/20 (!) 114/58    Past Medical History:  Diagnosis Date  . Cardiac resynchronization therapy defibrillator (CRT-D) in place    a. 05/2019 s/p SJM [facility] HF CRT-D [id number] (ser# [id number]).  . Chronic combined systolic (congestive) and diastolic (congestive) heart failure ([place])    a. 01/2019 Echo: EF 40-45%, focal basal inf aneurysmal region. Gr2 DD. Nl RV fxn. Mildly dil RA. Mod TR. Triv PR; b. 02/2019 cMRI: EF 35%.  . Diabetes mellitus without complication ([place])   . LBBB (left bundle branch block)   . NICM (nonischemic cardiomyopathy) ([place])    a. 01/2019 Echo: EF 40-45%; b. 01/2019 Cath: nl cors. EF 35-45%; c. 02/2019 cMRI: Basal inf and basal infsept midwall LGE. EF 35%; d. 05/2019 s/p SJM [facility] HF CRT-D [id number] (ser# [id number]).    Past Surgical History:  Procedure Laterality Date  . BIV ICD  INSERTION CRT-D N/A 05/06/2019   Procedure: BIV ICD INSERTION CRT-D;  Surgeon: Constance Haw, MD;  Location: [facility];  Service: Cardiovascular;  Laterality: N/A;  . LEFT HEART CATH AND CORONARY ANGIOGRAPHY N/A 01/13/2019   Procedure: LEFT HEART CATH AND CORONARY ANGIOGRAPHY;  Surgeon: Martinique, Peter M, MD;  Location: [facility];  Service: Cardiovascular;  Laterality: N/A;    Current Medications: Current Meds  Medication Sig  . acetaminophen (TYLENOL) 500  MG tablet Take 500 mg by mouth daily as needed for moderate pain or headache.  Marland Kitchen aspirin EC 81 MG tablet Take 81 mg by mouth daily.   . carvedilol (COREG) 12.5 MG tablet Take 1 tablet (12.5 mg total) by mouth 2 (two) times daily with a meal.  . cholecalciferol (VITAMIN D3) 25 MCG (1000 UT) tablet Take 1,000 Units by mouth daily.  Marland Kitchen FARXIGA 10 MG TABS tablet Take 10 mg by mouth daily. Take 1 tablet by mouth every day  . lisinopril (ZESTRIL) 20 MG tablet TAKE (1) TABLET BY MOUTH ONCE DAILY.  . metFORMIN (GLUCOPHAGE) 500 MG tablet Take 1 tablet (500 mg total) by mouth 2 (two) times daily.  . rosuvastatin (CRESTOR) 10 MG tablet Take 10 mg by mouth daily.   . Tiotropium Bromide Monohydrate (SPIRIVA RESPIMAT) 2.5 MCG/ACT AERS Inhale 2 puffs into the lungs daily.  Marland Kitchen zinc gluconate 50 MG tablet Take 50 mg by mouth daily.     Allergies:   Otho Darner allergy]   Social History   Socioeconomic History  . Marital status: Married    Spouse name: Not on file  . Number of children: Not on file  . Years of education: Not on file  . Highest education level: Not on file  Occupational History  . Not on file  Tobacco Use  . Smoking status: Former Smoker    Types: Cigarettes    Quit date: 2002    Years since quitting: 20.3  . Smokeless tobacco: Never Used  . Tobacco comment: Quit 2002  Substance and Sexual Activity  . Alcohol use: No  . Drug use: No  . Sexual activity: Not on file  Other Topics Concern  . Not on file  Social History Narrative  . Not on file   Social Determinants of Health   Financial Resource Strain: Not on file  Food Insecurity: Not on file  Transportation Needs: Not on file  Physical Activity: Not on file  Stress: Not on file  Social Connections: Not on file     Family History: Father died of MI at age 28  ROS:   Please see the history of present illness.    All other systems reviewed and are negative.  EKGs/Labs/Other Studies Reviewed:    The following  studies were reviewed today:   EKG:  EKG is not ordered today.  The ekg ordered most recently shows sinus rhythm, BiV paced   TTE 01/04/19:  1. Left ventricular ejection fraction, by visual estimation, is 40 to 45%. The left ventricle has normal function. There is no left ventricular hypertrophy.  2. Basal inferior segment is abnormal.  3. Left ventricular diastolic parameters are consistent with Grade II diastolic dysfunction (pseudonormalization).  4. Global right ventricle has normal systolic function.The right ventricular size is mildly enlarged. No increase in right ventricular wall thickness.  5. Left atrial size was normal.  6. Right atrial size was mildly dilated.  7. The mitral valve is normal in structure. No evidence of mitral valve  regurgitation. No evidence of mitral stenosis.  8. The tricuspid valve is normal in structure. Tricuspid valve regurgitation moderate.  9. The aortic valve is normal in structure. Aortic valve regurgitation is not visualized. No evidence of aortic valve sclerosis or stenosis. 10. The pulmonic valve was normal in structure. Pulmonic valve regurgitation is trivial. 11. Mildly elevated pulmonary artery systolic pressure. 12. The inferior vena cava is normal in size with greater than 50% respiratory variability, suggesting right atrial pressure of 3 mmHg. 13. Focal basal inferior aneursymal region/infarct. 14. There is redundancy of the interatrial septum.  LHC 01/13/19:  There is moderate left ventricular systolic dysfunction.  LV end diastolic pressure is normal.  The left ventricular ejection fraction is 35-45% by visual estimate.   1. Normal coronary anatomy 2. Moderate LV dysfunction 3. inferobasal aneurysm 4. Normal LVEDP  CMR 02/11/19: 1. 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 While transmural LGE suggests ischemic etiology, it is not in a coronary distribution and patient had normal coronaries on  recent cath. 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 2. Mild LV dilatation with moderate systolic dysfunction (EF 64%). Basal inferior aneurysm 3.  Normal RV size and systolic function (EF 40%)  Cardiac monitor 02/13/19:  Frequent second degree Mobitz II AV block. Symptoms of dizziness corresponded to Mobitz II AV block   14 days of data recorded on Zio monitor. Patient had a min HR of 29 bpm, max HR of 123 bpm, and avg HR of 69 bpm. Predominant underlying rhythm was Sinus Rhythm.  No VT, SVT, atrial fibrillation, or pauses noted. Isolated atrial and ventricular ectopy was rare (<1%).  8523 episodes of second degree Mobitz II AV block.  Patient triggered events, with symptoms reported of dizziness, corresponded to Mobitz II AV block.   Cardiac PET 04/01/19:  Scattered bilateral pulmonary nodules, largest in the right lower lobe measuring up to 1.1 cm with minimal metabolic activity,  which may represent atelectasis; recommend follow up chest CT in 3-6 months to assess for resolution.  FINAL COMMENTS  Rb-82 Cardiac PET/CT Myocardial Perfusion Imaging Study:   Abnormal rest perfusion in 4 out of 17 segments of the myocardium in a non "vascular" territory. This is consistent with  "scar/fibrosis" of the myocardium due to prior inflamation/infiltrative disease. It is not coronary artery disease related.   Rb-82 Cardiac PET/CT Functonal Imaging Study:   Sever decreased in left ventricular function. Dilated left ventricle. These findings are compatible with a nonischemic  cardiomyopathy.   F-18 FDG Cardiac PET/CT Myocardial Metabolic Study:   No evidence of active inflamation of the myocardium. No evidence of active inflamatory process of the myocardium to suggest  active sarcoid or active myocarditis.   Limited F-18 FDG PET/CT of the Chest and Abdomen:   Note that  evaluation is somewhat limited due to lack of IV contrast and slice thickness, within this limitation:   The thyroid is normal in appearance.   No mediastinal, hilar, or axillary lymphadenopathy. There are tiny mediastinal lymph nodes which do not meet size criteria for  pathologic enlargement. There are severe coronary artery and moderate thoracic aortic calcifications. No pericardial effusion.  The esophagus is normal in appearance.   No pleural effusion. No pneumothorax. Severe bilateral centrilobular and paraseptal emphysema. There are basal predominant  subpleural reticulations. There are scattered bilateral pulmonary nodules measuring up to 1.1 cm, for example:   Nodule #1:  Right upper lobe nodule measuring 6 mm (series 3, image 80).  Nodule #2: Left upper lobe pulmonary nodule measuring 5 mm (series 3, image 77).  Nodule #3: Right lower lobe subpleural nodule measuring 1.1 cm (series 3, image 110) (SUV max 1.4).   Liver has a normal contour. Multifocal hypodense liver lesions without significant metabolic activity, likely cysts. Gallbladder,  pancreas, and bilateral adrenal glands are normal in appearance. Punctate splenic calcifications, likely sequela of granulomatous  disease.   Bilateral kidneys are normal in size without evidence of hydronephrosis. Right sided nephrolithiasis.   Visualized stomach, small bowel, large bowel is normal in caliber. No evidence of focal wall thickening.   Visualized abdominal aorta is normal in caliber. No suspicious osseous lesions.  Recent Labs: 12/15/2019: BUN 15; Creatinine, Ser 0.62; Potassium 4.6; Sodium 139  Recent Lipid Panel    Component Value Date/Time   CHOL 123 01/10/2019 1027   TRIG 66 01/10/2019 1027   HDL 52 01/10/2019 1027   CHOLHDL 2.4 01/10/2019 1027   LDLCALC 57 01/10/2019 1027    Physical Exam:    VS:  BP 122/60 (BP Location: Left Arm, Patient Position: Sitting, Cuff Size: Normal)   Pulse 72   Ht 5\' 10"  (1.778 m)    Wt 137 lb (62.1 kg)   BMI 19.66 kg/m     Wt Readings from Last 3 Encounters:  07/04/20 137 lb (62.1 kg)  05/29/20 134 lb (60.8 kg)  03/12/20 134 lb 6.4 oz (61 kg)     GEN:  Well nourished, well developed in no acute distress HEENT: Normal NECK: No JVD CARDIAC: RRR, no murmurs, rubs, gallops RESPIRATORY:  Clear to auscultation without rales, wheezing or rhonchi  ABDOMEN: Soft, non-tender, non-distended MUSCULOSKELETAL:  No edema SKIN: Warm and dry NEUROLOGIC:  Alert and oriented x 3 PSYCHIATRIC:  Normal affect   ASSESSMENT:    1. Chronic combined systolic (congestive) and diastolic (congestive) heart failure ([place])   2. Lightheaded   3. Second degree Mobitz II AV block   4. Hyperlipidemia, unspecified hyperlipidemia type    PLAN:    Chronic combined systolic and diastolic heart failure: Normal coronary arteries on cath.  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  No evidence of active myocardial inflammation on cardiac PET at [facility] on 04/01/2019.  Status post CRT-D placement by Dr. Curt Bears on 05/06/2019 - Continue lisinopril 20 mg daily.  Has not been on Entresto due to hyperkalemia -Continue carvedilol 12.5 mg twice daily -Continue Farxiga 10 mg daily - Previously on spironolactone but held due to hyperkalemia.  May be able to tolerate now that on Farxiga.  We will recheck BMP and if stable will add spironolactone - Follows with EP for ICD management -Recheck echocardiogram to evaluate for improvement on GDMT  Lightheadedness: orthostatic by description.  Orthostatics in clinic today were unremarkable   Second-degree Mobitz 2 AV block: Presented with syncope.  Zio patch confirmed frequent episodes of second-degree Mobitz 2 AV block with symptoms of dizziness corresponding to periods of Mobitz 2. S/p CRT-D on 05/06/19  Hyperlipidemia: On rosuvastatin 10 mg daily.  LDL 75 on 04/13/20  Type 2 diabetes: Well-controlled, reports A1c  7.1%.  On Metformin and Farxiga  Pulmonary nodules/COPD: Scattered bilateral nodules noted on PET/[facility] at [facility], recommended chest CT follow-up in 3 to 6 months.  CT chest 07/07/2019 showed several small pulmonary nodules, along with moderate to severe emphysema.  Referred to pulmonology for further evaluation, had PFTs which showed  mild emphysema.    RTC in 3 months    Medication Adjustments/Labs and Tests Ordered: Current medicines are reviewed at length with the patient today.  Concerns regarding medicines are outlined above.  Orders Placed This Encounter  Procedures  . Basic metabolic panel  . Magnesium  . ECHOCARDIOGRAM COMPLETE   No orders of the defined types were placed in this encounter.   There are no Patient Instructions on file for this visit.   Signed, Donato Heinz, MD  07/04/2020 2:40 PM    [place]

## 2020-07-04 ENCOUNTER — Other Ambulatory Visit: Payer: Self-pay

## 2020-07-04 ENCOUNTER — Encounter: Payer: Self-pay | Admitting: Cardiology

## 2020-07-04 ENCOUNTER — Ambulatory Visit (INDEPENDENT_AMBULATORY_CARE_PROVIDER_SITE_OTHER): Payer: Medicare Other | Admitting: Cardiology

## 2020-07-04 VITALS — BP 122/60 | HR 72 | Ht 70.0 in | Wt 137.0 lb

## 2020-07-04 DIAGNOSIS — I5042 Chronic combined systolic (congestive) and diastolic (congestive) heart failure: Secondary | ICD-10-CM

## 2020-07-04 DIAGNOSIS — R42 Dizziness and giddiness: Secondary | ICD-10-CM

## 2020-07-04 DIAGNOSIS — E785 Hyperlipidemia, unspecified: Secondary | ICD-10-CM | POA: Diagnosis not present

## 2020-07-04 DIAGNOSIS — I441 Atrioventricular block, second degree: Secondary | ICD-10-CM

## 2020-07-04 DIAGNOSIS — I5020 Unspecified systolic (congestive) heart failure: Secondary | ICD-10-CM | POA: Diagnosis not present

## 2020-07-04 NOTE — Patient Instructions (Signed)
Medication Instructions:  Your physician recommends that you continue on your current medications as directed. Please refer to the Current Medication list given to you today.  *If you need a refill on your cardiac medications before your next appointment, please call your pharmacy*   Lab Work: BMET, [place] today  If you have labs (blood work) drawn today and your tests are completely normal, you will receive your results only by: Marland Kitchen MyChart Message (if you have MyChart) OR . A paper copy in the mail If you have any lab test that is abnormal or we need to change your treatment, we will call you to review the results.   Testing/Procedures: Your physician has requested that you have an echocardiogram. Echocardiography is a painless test that uses sound waves to create images of your heart. It provides your doctor with information about the size and shape of your heart and how well your heart's chambers and valves are working. This procedure takes approximately one hour. There are no restrictions for this procedure.  This will be done at our [facility] location:  [place]: At [employer], you and your health needs are our priority.  As part of our continuing mission to provide you with exceptional heart care, we have created designated Provider Care Teams.  These Care Teams include your primary Cardiologist (physician) and Advanced Practice Providers (APPs -  Physician Assistants and Nurse Practitioners) who all work together to provide you with the care you need, when you need it.  We recommend signing up for the patient portal called "MyChart".  Sign up information is provided on this After Visit Summary.  MyChart is used to connect with patients for Virtual Visits (Telemedicine).  Patients are able to view lab/test results, encounter notes, upcoming appointments, etc.  Non-urgent messages can be sent to your provider as well.   To learn more about what you  can do with MyChart, go to [email or web address].    Your next appointment:   3 month(s)  The format for your next appointment:   In Person  Provider:   Oswaldo Milian, MD

## 2020-07-05 LAB — BASIC METABOLIC PANEL
BUN/Creatinine Ratio: 16 (ref 10–24)
BUN: 12 mg/dL (ref 8–27)
CO2: 24 mmol/L (ref 20–29)
Calcium: 9.6 mg/dL (ref 8.6–10.2)
Chloride: 100 mmol/L (ref 96–106)
Creatinine, Ser: 0.74 mg/dL — ABNORMAL LOW (ref 0.76–1.27)
Glucose: 104 mg/dL — ABNORMAL HIGH (ref 65–99)
Potassium: 4.6 mmol/L (ref 3.5–5.2)
Sodium: 140 mmol/L (ref 134–144)
eGFR: 92 mL/min/{1.73_m2} (ref >59)

## 2020-07-05 LAB — MAGNESIUM: Magnesium: 2.2 mg/dL (ref 1.6–2.3)

## 2020-07-09 ENCOUNTER — Other Ambulatory Visit: Payer: Self-pay

## 2020-07-09 DIAGNOSIS — Z79899 Other long term (current) drug therapy: Secondary | ICD-10-CM

## 2020-07-09 MED ORDER — SPIRONOLACTONE 25 MG PO TABS: 12.5000 mg | ORAL_TABLET | Freq: Every day | ORAL | 3 refills | Status: DC

## 2020-07-10 DIAGNOSIS — E119 Type 2 diabetes mellitus without complications: Secondary | ICD-10-CM | POA: Diagnosis not present

## 2020-07-10 DIAGNOSIS — E559 Vitamin D deficiency, unspecified: Secondary | ICD-10-CM | POA: Diagnosis not present

## 2020-07-10 DIAGNOSIS — I10 Essential (primary) hypertension: Secondary | ICD-10-CM | POA: Diagnosis not present

## 2020-07-10 DIAGNOSIS — E782 Mixed hyperlipidemia: Secondary | ICD-10-CM | POA: Diagnosis not present

## 2020-07-16 DIAGNOSIS — Z79899 Other long term (current) drug therapy: Secondary | ICD-10-CM | POA: Diagnosis not present

## 2020-07-17 LAB — BASIC METABOLIC PANEL
BUN/Creatinine Ratio: 19 (ref 10–24)
BUN: 15 mg/dL (ref 8–27)
CO2: 23 mmol/L (ref 20–29)
Calcium: 9.6 mg/dL (ref 8.6–10.2)
Chloride: 100 mmol/L (ref 96–106)
Creatinine, Ser: 0.77 mg/dL (ref 0.76–1.27)
Glucose: 140 mg/dL — ABNORMAL HIGH (ref 65–99)
Potassium: 4.6 mmol/L (ref 3.5–5.2)
Sodium: 138 mmol/L (ref 134–144)
eGFR: 91 mL/min/{1.73_m2} (ref >59)

## 2020-07-18 DIAGNOSIS — E875 Hyperkalemia: Secondary | ICD-10-CM | POA: Diagnosis not present

## 2020-07-18 DIAGNOSIS — R911 Solitary pulmonary nodule: Secondary | ICD-10-CM | POA: Diagnosis not present

## 2020-07-18 DIAGNOSIS — E782 Mixed hyperlipidemia: Secondary | ICD-10-CM | POA: Diagnosis not present

## 2020-07-18 DIAGNOSIS — I5042 Chronic combined systolic (congestive) and diastolic (congestive) heart failure: Secondary | ICD-10-CM | POA: Diagnosis not present

## 2020-07-18 DIAGNOSIS — I441 Atrioventricular block, second degree: Secondary | ICD-10-CM | POA: Diagnosis not present

## 2020-07-18 DIAGNOSIS — I420 Dilated cardiomyopathy: Secondary | ICD-10-CM | POA: Diagnosis not present

## 2020-07-18 DIAGNOSIS — E1165 Type 2 diabetes mellitus with hyperglycemia: Secondary | ICD-10-CM | POA: Diagnosis not present

## 2020-07-18 DIAGNOSIS — E559 Vitamin D deficiency, unspecified: Secondary | ICD-10-CM | POA: Diagnosis not present

## 2020-08-03 ENCOUNTER — Ambulatory Visit (INDEPENDENT_AMBULATORY_CARE_PROVIDER_SITE_OTHER): Payer: Medicare Other

## 2020-08-03 DIAGNOSIS — I255 Ischemic cardiomyopathy: Secondary | ICD-10-CM

## 2020-08-03 LAB — CUP PACEART REMOTE DEVICE CHECK
Battery Remaining Longevity: 79 mo
Battery Remaining Percentage: 83 %
Battery Voltage: 2.96 V
Brady Statistic AP VP Percent: 12 %
Brady Statistic AP VS Percent: 1 %
Brady Statistic AS VP Percent: 88 %
Brady Statistic AS VS Percent: 1 %
Brady Statistic RA Percent Paced: 11 %
Date Time Interrogation Session: 20220603035852
HighPow Impedance: 65 Ohm
Implantable Lead Implant Date: 20210305
Implantable Lead Implant Date: 20210305
Implantable Lead Implant Date: 20210305
Implantable Lead Location: 753858
Implantable Lead Location: 753859
Implantable Lead Location: 753860
Implantable Pulse Generator Implant Date: 20210305
Lead Channel Impedance Value: 1125 Ohm
Lead Channel Impedance Value: 440 Ohm
Lead Channel Impedance Value: 450 Ohm
Lead Channel Pacing Threshold Amplitude: 0.875 V
Lead Channel Pacing Threshold Amplitude: 1.125 V
Lead Channel Pacing Threshold Amplitude: 2.25 V
Lead Channel Pacing Threshold Pulse Width: 0.5 ms
Lead Channel Pacing Threshold Pulse Width: 0.5 ms
Lead Channel Pacing Threshold Pulse Width: 0.8 ms
Lead Channel Sensing Intrinsic Amplitude: 12 mV
Lead Channel Sensing Intrinsic Amplitude: 4.8 mV
Lead Channel Setting Pacing Amplitude: 1.875
Lead Channel Setting Pacing Amplitude: 2.125
Lead Channel Setting Pacing Amplitude: 2.75 V
Lead Channel Setting Pacing Pulse Width: 0.5 ms
Lead Channel Setting Pacing Pulse Width: 0.8 ms
Lead Channel Setting Sensing Sensitivity: 0.5 mV
Pulse Gen Serial Number: 111018177

## 2020-08-13 ENCOUNTER — Ambulatory Visit (HOSPITAL_COMMUNITY)
Admission: RE | Admit: 2020-08-13 | Discharge: 2020-08-13 | Disposition: A | Discharge status: Home / Self Care | Payer: Medicare Other | Source: Ambulatory Visit | Attending: Cardiology | Admitting: Cardiology

## 2020-08-13 ENCOUNTER — Other Ambulatory Visit: Payer: Self-pay

## 2020-08-13 DIAGNOSIS — I5042 Chronic combined systolic (congestive) and diastolic (congestive) heart failure: Secondary | ICD-10-CM | POA: Insufficient documentation

## 2020-08-13 LAB — ECHOCARDIOGRAM COMPLETE
Area-P 1/2: 4.46 cm2
S' Lateral: 4.1 cm

## 2020-08-13 NOTE — Progress Notes (Signed)
*  PRELIMINARY RESULTS* Echocardiogram 2D Echocardiogram has been performed.  Richard Martin 08/13/2020, 1:53 PM

## 2020-08-21 NOTE — Progress Notes (Signed)
Remote ICD transmission.   

## 2020-10-08 ENCOUNTER — Ambulatory Visit: Payer: Medicare Other | Admitting: Cardiology

## 2020-10-13 ENCOUNTER — Other Ambulatory Visit: Payer: Self-pay | Admitting: Adult Health

## 2020-10-27 ENCOUNTER — Other Ambulatory Visit: Payer: Self-pay | Admitting: Cardiology

## 2020-11-01 ENCOUNTER — Other Ambulatory Visit: Payer: Self-pay | Admitting: Cardiology

## 2020-11-01 NOTE — Telephone Encounter (Signed)
Rx(s) sent to pharmacy electronically.  

## 2020-11-02 ENCOUNTER — Ambulatory Visit (INDEPENDENT_AMBULATORY_CARE_PROVIDER_SITE_OTHER): Payer: Medicare Other

## 2020-11-02 DIAGNOSIS — I255 Ischemic cardiomyopathy: Secondary | ICD-10-CM | POA: Diagnosis not present

## 2020-11-06 LAB — CUP PACEART REMOTE DEVICE CHECK
Battery Remaining Longevity: 68 mo
Battery Remaining Percentage: 79 %
Battery Voltage: 2.96 V
Brady Statistic AP VP Percent: 14 %
Brady Statistic AP VS Percent: 1 %
Brady Statistic AS VP Percent: 86 %
Brady Statistic AS VS Percent: 1 %
Brady Statistic RA Percent Paced: 14 %
Date Time Interrogation Session: 20220902031851
HighPow Impedance: 64 Ohm
Implantable Lead Implant Date: 20210305
Implantable Lead Implant Date: 20210305
Implantable Lead Implant Date: 20210305
Implantable Lead Location: 753858
Implantable Lead Location: 753859
Implantable Lead Location: 753860
Implantable Pulse Generator Implant Date: 20210305
Lead Channel Impedance Value: 1125 Ohm
Lead Channel Impedance Value: 440 Ohm
Lead Channel Impedance Value: 450 Ohm
Lead Channel Pacing Threshold Amplitude: 0.75 V
Lead Channel Pacing Threshold Amplitude: 1 V
Lead Channel Pacing Threshold Amplitude: 2.125 V
Lead Channel Pacing Threshold Pulse Width: 0.5 ms
Lead Channel Pacing Threshold Pulse Width: 0.5 ms
Lead Channel Pacing Threshold Pulse Width: 0.8 ms
Lead Channel Sensing Intrinsic Amplitude: 12 mV
Lead Channel Sensing Intrinsic Amplitude: 2.7 mV
Lead Channel Setting Pacing Amplitude: 1.75 V
Lead Channel Setting Pacing Amplitude: 2 V
Lead Channel Setting Pacing Amplitude: 2.625
Lead Channel Setting Pacing Pulse Width: 0.5 ms
Lead Channel Setting Pacing Pulse Width: 0.8 ms
Lead Channel Setting Sensing Sensitivity: 0.5 mV
Pulse Gen Serial Number: 111018177

## 2020-11-14 NOTE — Progress Notes (Signed)
Remote ICD transmission.   

## 2020-11-27 DIAGNOSIS — Z23 Encounter for immunization: Secondary | ICD-10-CM | POA: Diagnosis not present

## 2020-12-07 DIAGNOSIS — E1165 Type 2 diabetes mellitus with hyperglycemia: Secondary | ICD-10-CM | POA: Diagnosis not present

## 2021-01-04 ENCOUNTER — Other Ambulatory Visit: Payer: Self-pay | Admitting: Adult Health

## 2021-01-31 ENCOUNTER — Other Ambulatory Visit: Payer: Self-pay | Admitting: Cardiology

## 2021-02-01 ENCOUNTER — Ambulatory Visit (INDEPENDENT_AMBULATORY_CARE_PROVIDER_SITE_OTHER): Payer: Medicare Other

## 2021-02-01 DIAGNOSIS — I5042 Chronic combined systolic (congestive) and diastolic (congestive) heart failure: Secondary | ICD-10-CM | POA: Diagnosis not present

## 2021-02-01 DIAGNOSIS — E1165 Type 2 diabetes mellitus with hyperglycemia: Secondary | ICD-10-CM | POA: Diagnosis not present

## 2021-02-01 DIAGNOSIS — I420 Dilated cardiomyopathy: Secondary | ICD-10-CM | POA: Diagnosis not present

## 2021-02-01 DIAGNOSIS — I428 Other cardiomyopathies: Secondary | ICD-10-CM

## 2021-02-01 DIAGNOSIS — M791 Myalgia, unspecified site: Secondary | ICD-10-CM | POA: Diagnosis not present

## 2021-02-01 DIAGNOSIS — R110 Nausea: Secondary | ICD-10-CM | POA: Diagnosis not present

## 2021-02-01 DIAGNOSIS — E559 Vitamin D deficiency, unspecified: Secondary | ICD-10-CM | POA: Diagnosis not present

## 2021-02-01 DIAGNOSIS — I441 Atrioventricular block, second degree: Secondary | ICD-10-CM | POA: Diagnosis not present

## 2021-02-01 DIAGNOSIS — E875 Hyperkalemia: Secondary | ICD-10-CM | POA: Diagnosis not present

## 2021-02-01 DIAGNOSIS — E782 Mixed hyperlipidemia: Secondary | ICD-10-CM | POA: Diagnosis not present

## 2021-02-01 DIAGNOSIS — Z0001 Encounter for general adult medical examination with abnormal findings: Secondary | ICD-10-CM | POA: Diagnosis not present

## 2021-02-01 DIAGNOSIS — R112 Nausea with vomiting, unspecified: Secondary | ICD-10-CM | POA: Diagnosis not present

## 2021-02-01 DIAGNOSIS — R911 Solitary pulmonary nodule: Secondary | ICD-10-CM | POA: Diagnosis not present

## 2021-02-01 LAB — CUP PACEART REMOTE DEVICE CHECK
Battery Remaining Longevity: 73 mo
Battery Remaining Percentage: 77 %
Battery Voltage: 2.96 V
Brady Statistic AP VP Percent: 17 %
Brady Statistic AP VS Percent: 1 %
Brady Statistic AS VP Percent: 83 %
Brady Statistic AS VS Percent: 1 %
Brady Statistic RA Percent Paced: 17 %
Date Time Interrogation Session: 20221202100519
HighPow Impedance: 64 Ohm
Implantable Lead Implant Date: 20210305
Implantable Lead Implant Date: 20210305
Implantable Lead Implant Date: 20210305
Implantable Lead Location: 753858
Implantable Lead Location: 753859
Implantable Lead Location: 753860
Implantable Pulse Generator Implant Date: 20210305
Lead Channel Impedance Value: 1025 Ohm
Lead Channel Impedance Value: 440 Ohm
Lead Channel Impedance Value: 460 Ohm
Lead Channel Pacing Threshold Amplitude: 0.75 V
Lead Channel Pacing Threshold Amplitude: 1.125 V
Lead Channel Pacing Threshold Amplitude: 2 V
Lead Channel Pacing Threshold Pulse Width: 0.5 ms
Lead Channel Pacing Threshold Pulse Width: 0.5 ms
Lead Channel Pacing Threshold Pulse Width: 0.8 ms
Lead Channel Sensing Intrinsic Amplitude: 12 mV
Lead Channel Sensing Intrinsic Amplitude: 4.4 mV
Lead Channel Setting Pacing Amplitude: 1.75 V
Lead Channel Setting Pacing Amplitude: 2.125
Lead Channel Setting Pacing Amplitude: 2.5 V
Lead Channel Setting Pacing Pulse Width: 0.5 ms
Lead Channel Setting Pacing Pulse Width: 0.8 ms
Lead Channel Setting Sensing Sensitivity: 0.5 mV
Pulse Gen Serial Number: 111018177

## 2021-02-01 NOTE — Telephone Encounter (Signed)
Rx request sent to pharmacy.  

## 2021-02-12 NOTE — Progress Notes (Signed)
Remote ICD transmission.   

## 2021-02-20 ENCOUNTER — Other Ambulatory Visit: Payer: Self-pay

## 2021-02-20 ENCOUNTER — Ambulatory Visit (HOSPITAL_COMMUNITY)
Admission: RE | Admit: 2021-02-20 | Discharge: 2021-02-20 | Disposition: A | Discharge status: Home / Self Care | Payer: Medicare Other | Source: Ambulatory Visit | Attending: Pulmonary Disease | Admitting: Pulmonary Disease

## 2021-02-20 DIAGNOSIS — R918 Other nonspecific abnormal finding of lung field: Secondary | ICD-10-CM | POA: Diagnosis not present

## 2021-02-20 DIAGNOSIS — I700 Atherosclerosis of aorta: Secondary | ICD-10-CM | POA: Diagnosis not present

## 2021-02-20 DIAGNOSIS — J439 Emphysema, unspecified: Secondary | ICD-10-CM | POA: Diagnosis not present

## 2021-02-20 DIAGNOSIS — R911 Solitary pulmonary nodule: Secondary | ICD-10-CM | POA: Diagnosis not present

## 2021-03-04 NOTE — Progress Notes (Signed)
Cardiology Office Note:    Date:  03/06/2021   ID:  Richard Martin, DOB 15-Jun-1940, MRN [id number]  PCP:  Celene Squibb, MD  Cardiologist:  None  Electrophysiologist:  Will [name], MD   Referring MD: Celene Squibb, MD   No chief complaint on file.   History of Present Illness:    Richard Martin is a 81 y.o. male with a hx of diabetes, chronic combined systolic and diastolic heart failure and second-degree Mobitz 2 AV block presents for follow-up.  He was initially seen on 12/31/2018 after being referred by Dr. Nevada Crane for evaluation of EKG abnormalities and syncopal episode.  He had a episode of syncope where he was walking to his kitchen and had sudden loss of consciousness.  He was seen in clinic on 12/31/2018, and rhythm strip demonstrated intermittent 2:1 AV block, with a wide QRS.  He was referred to EP for evaluation for pacemaker, and an echocardiogram was ordered.  TTE was done on 01/04/2019, which showed EF 40 to 45%, with aneurysmal basal inferior segment.  TTE was also notable for grade 2 diastolic dysfunction, normal RV function, no significant valvular disease.  Given new systolic dysfunction, cardiac catheterization was done on 01/13/2019, which showed normal coronary arteries.  Cardiac MRI was done on 02/11/2019, which showed EF 35%, basal inferior aneurysm, transmural basal inferior LGE, basal inferoseptal mid wall LGE.  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  Zio patch x14 days was completed, which showed frequent second-degree Mobitz 2 AV block, with symptoms of dizziness corresponding to Mobitz 2 AV block.  He underwent cardiac PET at [facility] on 04/01/2019, which showed no active myocardial inflammation.  He underwent BiV ICD placement by Dr. Curt Bears on 05/06/2019.  Since CRT-D placed, reports lightheadedness/syncope has resolved and dyspnea/fatigue significantly improved.  Echocardiogram on 08/13/2020 showed EF 40 to 45%, inferior basal aneurysm, normal RV function, no  significant valvular disease.  Since last clinic visit, he reports that he has been doing well.  Denies any chest pain, dyspnea, lower extremity edema, or palpitations.  Some lightheadedness with standing.  No syncope.  Has been walking about 1 mile daily.  Denies any bleeding issues.    Wt Readings from Last 3 Encounters:  03/05/21 133 lb 6.4 oz (60.5 kg)  07/04/20 137 lb (62.1 kg)  05/29/20 134 lb (60.8 kg)   BP Readings from Last 3 Encounters:  03/05/21 126/64  07/04/20 122/60  05/29/20 122/64    Past Medical History:  Diagnosis Date   Cardiac resynchronization therapy defibrillator (CRT-D) in place    a. 05/2019 s/p SJM Gallant HF CRT-D [id number] (ser# [id number]).   Chronic combined systolic (congestive) and diastolic (congestive) heart failure ([place])    a. 01/2019 Echo: EF 40-45%, focal basal inf aneurysmal region. Gr2 DD. Nl RV fxn. Mildly dil RA. Mod TR. Triv PR; b. 02/2019 cMRI: EF 35%.   Diabetes mellitus without complication (HCC)    LBBB (left bundle branch block)    NICM (nonischemic cardiomyopathy) ([place])    a. 01/2019 Echo: EF 40-45%; b. 01/2019 Cath: nl cors. EF 35-45%; c. 02/2019 cMRI: Basal inf and basal infsept midwall LGE. EF 35%; d. 05/2019 s/p SJM Gallant HF CRT-D [id number] (ser# [id number]).    Past Surgical History:  Procedure Laterality Date   BIV ICD INSERTION CRT-D N/A 05/06/2019   Procedure: BIV ICD INSERTION CRT-D;  Surgeon: Constance Haw, MD;  Location: [facility];  Service: Cardiovascular;  Laterality: N/A;   LEFT HEART CATH  AND CORONARY ANGIOGRAPHY N/A 01/13/2019   Procedure: LEFT HEART CATH AND CORONARY ANGIOGRAPHY;  Surgeon: Martinique, Peter M, MD;  Location: [place] [facility];  Service: Cardiovascular;  Laterality: N/A;    Current Medications: Current Meds  Medication Sig   acetaminophen (TYLENOL) 500 MG tablet Take 500 mg by mouth daily as needed for moderate pain or headache.   apixaban (ELIQUIS) 2.5 MG TABS tablet Take 1 tablet (2.5  mg total) by mouth 2 (two) times daily.   carvedilol (COREG) 12.5 MG tablet TAKE (1) TABLET BY MOUTH TWICE DAILY WITH MEALS.   cholecalciferol (VITAMIN D3) 25 MCG (1000 UT) tablet Take 1,000 Units by mouth daily.   FARXIGA 10 MG TABS tablet Take 10 mg by mouth daily. Take 1 tablet by mouth every day   lisinopril (ZESTRIL) 20 MG tablet TAKE (1) TABLET BY MOUTH ONCE DAILY.   metFORMIN (GLUCOPHAGE) 500 MG tablet Take 1 tablet (500 mg total) by mouth 2 (two) times daily.   rosuvastatin (CRESTOR) 10 MG tablet Take 10 mg by mouth daily.    spironolactone (ALDACTONE) 25 MG tablet TAKE 1/2 TABLET BY MOUTH DAILY.   Tiotropium Bromide Monohydrate (SPIRIVA RESPIMAT) 2.5 MCG/ACT AERS Inhale 2 puffs into the lungs daily.   zinc gluconate 50 MG tablet Take 50 mg by mouth daily.   [DISCONTINUED] aspirin EC 81 MG tablet Take 81 mg by mouth daily.      Allergies:   Otho Darner allergy]   Social History   Socioeconomic History   Marital status: Married    Spouse name: Not on file   Number of children: Not on file   Years of education: Not on file   Highest education level: Not on file  Occupational History   Not on file  Tobacco Use   Smoking status: Former    Types: Cigarettes    Quit date: 2002    Years since quitting: 21.0   Smokeless tobacco: Never   Tobacco comments:    Quit 2002  Substance and Sexual Activity   Alcohol use: No   Drug use: No   Sexual activity: Not on file  Other Topics Concern   Not on file  Social History Narrative   Not on file   Social Determinants of Health   Financial Resource Strain: Not on file  Food Insecurity: Not on file  Transportation Needs: Not on file  Physical Activity: Not on file  Stress: Not on file  Social Connections: Not on file     Family History: Father died of MI at age 57  ROS:   Please see the history of present illness.    All other systems reviewed and are negative.  EKGs/Labs/Other Studies Reviewed:    The following  studies were reviewed today:   EKG:   03/05/21: Sinus rhythm, BiV paced, rate 72    TTE 01/04/19:  1. Left ventricular ejection fraction, by visual estimation, is 40 to 45%. The left ventricle has normal function. There is no left ventricular hypertrophy.  2. Basal inferior segment is abnormal.  3. Left ventricular diastolic parameters are consistent with Grade II diastolic dysfunction (pseudonormalization).  4. Global right ventricle has normal systolic function.The right ventricular size is mildly enlarged. No increase in right ventricular wall thickness.  5. Left atrial size was normal.  6. Right atrial size was mildly dilated.  7. The mitral valve is normal in structure. No evidence of mitral valve regurgitation. No evidence of mitral stenosis.  8. The tricuspid valve is  normal in structure. Tricuspid valve regurgitation moderate.  9. The aortic valve is normal in structure. Aortic valve regurgitation is not visualized. No evidence of aortic valve sclerosis or stenosis. 10. The pulmonic valve was normal in structure. Pulmonic valve regurgitation is trivial. 11. Mildly elevated pulmonary artery systolic pressure. 12. The inferior vena cava is normal in size with greater than 50% respiratory variability, suggesting right atrial pressure of 3 mmHg. 13. Focal basal inferior aneursymal region/infarct. 14. There is redundancy of the interatrial septum.  LHC 01/13/19: There is moderate left ventricular systolic dysfunction. LV end diastolic pressure is normal. The left ventricular ejection fraction is 35-45% by visual estimate.   1. Normal coronary anatomy 2. Moderate LV dysfunction 3. inferobasal aneurysm 4. Normal LVEDP   CMR 02/11/19: 1. 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 While transmural LGE suggests ischemic etiology, it is not in a coronary distribution and patient had normal coronaries on recent cath. 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 2. Mild LV dilatation with moderate systolic dysfunction (EF 54%). Basal inferior aneurysm 3.  Normal RV size and systolic function (EF 62%)  Cardiac monitor 02/13/19: Frequent second degree Mobitz II AV block. Symptoms of dizziness corresponded to Mobitz II AV block   14 days of data recorded on Zio monitor. Patient had a min HR of 29 bpm, max HR of 123 bpm, and avg HR of 69 bpm. Predominant underlying rhythm was Sinus Rhythm.  No VT, SVT, atrial fibrillation, or pauses noted. Isolated atrial and ventricular ectopy was rare (<1%).  8523 episodes of second degree Mobitz II AV block.  Patient triggered events, with symptoms reported of dizziness, corresponded to Mobitz II AV block.    Cardiac PET 04/01/19:  Scattered bilateral pulmonary nodules, largest in the right lower lobe measuring up to 1.1 cm with minimal metabolic activity,  which may represent atelectasis; recommend follow up chest CT in 3-6 months to assess for resolution.  FINAL COMMENTS  Rb-82 Cardiac PET/CT Myocardial Perfusion Imaging Study:   Abnormal rest perfusion in 4 out of 17 segments of the myocardium in a non "vascular" territory. This is consistent with  "scar/fibrosis" of the myocardium due to prior inflamation/infiltrative disease. It is not coronary artery disease related.   Rb-82 Cardiac PET/CT Functonal Imaging Study:   Sever decreased in left ventricular function. Dilated left ventricle. These findings are compatible with a nonischemic  cardiomyopathy.   F-18 FDG Cardiac PET/CT Myocardial Metabolic Study:   No evidence of active inflamation of the myocardium. No evidence of active inflamatory process of the myocardium to suggest  active sarcoid or active myocarditis.   Limited F-18 FDG PET/CT of the Chest and Abdomen:   Note that evaluation is somewhat limited due to lack of IV contrast and  slice thickness, within this limitation:   The thyroid is normal in appearance.   No mediastinal, hilar, or axillary lymphadenopathy. There are tiny mediastinal lymph nodes which do not meet size criteria for  pathologic enlargement. There are severe coronary artery and moderate thoracic aortic calcifications. No pericardial effusion.  The esophagus is normal in appearance.   No pleural effusion. No pneumothorax. Severe bilateral centrilobular and paraseptal emphysema. There are basal predominant  subpleural reticulations. There are scattered bilateral pulmonary nodules measuring up to 1.1 cm, for example:   Nodule #1: Right upper lobe nodule measuring 6 mm (series 3, image 80).  Nodule #2:  Left upper lobe pulmonary nodule measuring 5 mm (series 3, image 77).  Nodule #3: Right lower lobe subpleural nodule measuring 1.1 cm (series 3, image 110) (SUV max 1.4).   Liver has a normal contour. Multifocal hypodense liver lesions without significant metabolic activity, likely cysts. Gallbladder,  pancreas, and bilateral adrenal glands are normal in appearance. Punctate splenic calcifications, likely sequela of granulomatous  disease.   Bilateral kidneys are normal in size without evidence of hydronephrosis. Right sided nephrolithiasis.   Visualized stomach, small bowel, large bowel is normal in caliber. No evidence of focal wall thickening.   Visualized abdominal aorta is normal in caliber. No suspicious osseous lesions.  Recent Labs: 07/04/2020: Magnesium 2.2 07/16/2020: BUN 15; Creatinine, Ser 0.77; Potassium 4.6; Sodium 138  Recent Lipid Panel    Component Value Date/Time   CHOL 123 01/10/2019 1027   TRIG 66 01/10/2019 1027   HDL 52 01/10/2019 1027   CHOLHDL 2.4 01/10/2019 1027   LDLCALC 57 01/10/2019 1027    Physical Exam:    VS:  BP 126/64    Pulse 72    Ht 5\' 10"  (1.778 m)    Wt 133 lb 6.4 oz (60.5 kg)    SpO2 99%    BMI 19.14 kg/m     Wt Readings from Last 3 Encounters:   03/05/21 133 lb 6.4 oz (60.5 kg)  07/04/20 137 lb (62.1 kg)  05/29/20 134 lb (60.8 kg)     GEN:  Well nourished, well developed in no acute distress HEENT: Normal NECK: No JVD CARDIAC: RRR, no murmurs, rubs, gallops RESPIRATORY:  Clear to auscultation without rales, wheezing or rhonchi  ABDOMEN: Soft, non-tender, non-distended MUSCULOSKELETAL:  No edema SKIN: Warm and dry NEUROLOGIC:  Alert and oriented x 3 PSYCHIATRIC:  Normal affect   ASSESSMENT:    1. Paroxysmal atrial fibrillation (HCC)   2. Chronic combined systolic (congestive) and diastolic (congestive) heart failure ([place])   3. Second degree Mobitz II AV block   4. Hyperlipidemia, unspecified hyperlipidemia type     PLAN:    Chronic combined systolic and diastolic heart failure: Normal coronary arteries on cath.  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  No evidence of active myocardial inflammation on cardiac PET at [facility] on 04/01/2019.  Status post CRT-D placement by Dr. Curt Bears on 05/06/2019.  Echocardiogram on 08/13/2020 showed EF 40 to 45%, inferior basal aneurysm, normal RV function, no significant valvular disease. - Continue lisinopril 20 mg daily.  Has not been on Entresto due to hyperkalemia - Continue carvedilol 12.5 mg twice daily - Continue Farxiga 10 mg daily - Continue spironolactone 12.5 mg daily - Follows with EP for ICD management -Check CMP, magnesium  Paroxysmal atrial fibrillation: Noted on recent device interrogation.  CHA2DS2-VASc score 5 (CHF, hypertension, age x2, T2DM) -Recommend starting Eliquis 2.5 mg twice daily (reduced dose given age 28 and weight 60 kg).  Will stop aspirin -Continue Coreg 12.5 mg twice daily -Check CBC, TSH   Second-degree Mobitz 2 AV block: Presented with syncope.  Zio patch confirmed frequent episodes of second-degree Mobitz 2 AV block with symptoms of dizziness corresponding to periods of Mobitz 2. S/p CRT-D on 05/06/19    Hyperlipidemia: On rosuvastatin 10 mg daily.  LDL 75 on 04/13/20   Type 2 diabetes: Well-controlled, reports A1c 7.1%.  On Metformin and Farxiga  Pulmonary nodules/COPD: Scattered bilateral nodules noted on PET/CT at [facility], recommended chest CT follow-up in 3 to 6 months.  CT chest  07/07/2019 showed several small pulmonary nodules, along with moderate to severe emphysema.  Referred to pulmonology for further evaluation, had PFTs which showed mild emphysema.     RTC in 6 months    Medication Adjustments/Labs and Tests Ordered: Current medicines are reviewed at length with the patient today.  Concerns regarding medicines are outlined above.  Orders Placed This Encounter  Procedures   Comprehensive Metabolic Panel (CMET)   CBC   Magnesium   TSH   EKG 12-Lead   Meds ordered this encounter  Medications   apixaban (ELIQUIS) 2.5 MG TABS tablet    Sig: Take 1 tablet (2.5 mg total) by mouth 2 (two) times daily.    Dispense:  60 tablet    Refill:  11    Patient Instructions  Medication Instructions:   STOP ASPIRIN  START ELIQUIS 2.5 MG ONE TABLET TWICE DAILY  *If you need a refill on your cardiac medications before your next appointment, please call your pharmacy*   Lab Work:  Your physician recommends that you HAVE LAB WORK TODAY.  If you have labs (blood work) drawn today and your tests are completely normal, you will receive your results only by: [place] (if you have MyChart) OR A paper copy in the mail If you have any lab test that is abnormal or we need to change your treatment, we will call you to review the results.   Follow-Up: At [facility], you and your health needs are our priority.  As part of our continuing mission to provide you with exceptional heart care, we have created designated Provider Care Teams.  These Care Teams include your primary Cardiologist (physician) and Advanced Practice Providers (APPs -  Physician Assistants and Nurse Practitioners)  who all work together to provide you with the care you need, when you need it.  We recommend signing up for the patient portal called "MyChart".  Sign up information is provided on this After Visit Summary.  MyChart is used to connect with patients for Virtual Visits (Telemedicine).  Patients are able to view lab/test results, encounter notes, upcoming appointments, etc.  Non-urgent messages can be sent to your provider as well.   To learn more about what you can do with MyChart, go to [email or web address].    Your next appointment:   6 month(s)  The format for your next appointment:   In Person  Provider:   Oswaldo Milian MD      Signed, Donato Heinz, MD  03/06/2021 12:18 AM    [place]

## 2021-03-05 ENCOUNTER — Ambulatory Visit (INDEPENDENT_AMBULATORY_CARE_PROVIDER_SITE_OTHER): Payer: Medicare Other | Admitting: Cardiology

## 2021-03-05 ENCOUNTER — Other Ambulatory Visit: Payer: Self-pay

## 2021-03-05 ENCOUNTER — Encounter: Payer: Self-pay | Admitting: Cardiology

## 2021-03-05 VITALS — BP 126/64 | HR 72 | Ht 70.0 in | Wt 133.4 lb

## 2021-03-05 DIAGNOSIS — I5042 Chronic combined systolic (congestive) and diastolic (congestive) heart failure: Secondary | ICD-10-CM

## 2021-03-05 DIAGNOSIS — E785 Hyperlipidemia, unspecified: Secondary | ICD-10-CM

## 2021-03-05 DIAGNOSIS — I480 Paroxysmal atrial fibrillation: Secondary | ICD-10-CM | POA: Diagnosis not present

## 2021-03-05 DIAGNOSIS — I4891 Unspecified atrial fibrillation: Secondary | ICD-10-CM

## 2021-03-05 DIAGNOSIS — I441 Atrioventricular block, second degree: Secondary | ICD-10-CM

## 2021-03-05 MED ORDER — APIXABAN 2.5 MG PO TABS: 2.5000 mg | ORAL_TABLET | Freq: Two times a day (BID) | ORAL | 11 refills | Status: DC

## 2021-03-05 NOTE — Patient Instructions (Signed)
Medication Instructions:   STOP ASPIRIN  START ELIQUIS 2.5 MG ONE TABLET TWICE DAILY  *If you need a refill on your cardiac medications before your next appointment, please call your pharmacy*   Lab Work:  Your physician recommends that you HAVE LAB WORK TODAY.  If you have labs (blood work) drawn today and your tests are completely normal, you will receive your results only by: [place] (if you have MyChart) OR A paper copy in the mail If you have any lab test that is abnormal or we need to change your treatment, we will call you to review the results.   Follow-Up: At [facility], you and your health needs are our priority.  As part of our continuing mission to provide you with exceptional heart care, we have created designated Provider Care Teams.  These Care Teams include your primary Cardiologist (physician) and Advanced Practice Providers (APPs -  Physician Assistants and Nurse Practitioners) who all work together to provide you with the care you need, when you need it.  We recommend signing up for the patient portal called "MyChart".  Sign up information is provided on this After Visit Summary.  MyChart is used to connect with patients for Virtual Visits (Telemedicine).  Patients are able to view lab/test results, encounter notes, upcoming appointments, etc.  Non-urgent messages can be sent to your provider as well.   To learn more about what you can do with MyChart, go to [email or web address].    Your next appointment:   6 month(s)  The format for your next appointment:   In Person  Provider:   Oswaldo Milian MD

## 2021-03-06 LAB — COMPREHENSIVE METABOLIC PANEL
ALT: 13 IU/L (ref 0–44)
AST: 16 IU/L (ref 0–40)
Albumin/Globulin Ratio: 1.9 (ref 1.2–2.2)
Albumin: 4.7 g/dL (ref 3.7–4.7)
Alkaline Phosphatase: 55 IU/L (ref 44–121)
BUN/Creatinine Ratio: 17 (ref 10–24)
BUN: 12 mg/dL (ref 8–27)
Bilirubin Total: 0.5 mg/dL (ref 0.0–1.2)
CO2: 24 mmol/L (ref 20–29)
Calcium: 9.5 mg/dL (ref 8.6–10.2)
Chloride: 99 mmol/L (ref 96–106)
Creatinine, Ser: 0.72 mg/dL — ABNORMAL LOW (ref 0.76–1.27)
Globulin, Total: 2.5 g/dL (ref 1.5–4.5)
Glucose: 125 mg/dL — ABNORMAL HIGH (ref 70–99)
Potassium: 4.2 mmol/L (ref 3.5–5.2)
Sodium: 138 mmol/L (ref 134–144)
Total Protein: 7.2 g/dL (ref 6.0–8.5)
eGFR: 92 mL/min/{1.73_m2} (ref >59)

## 2021-03-06 LAB — CBC
Hematocrit: 38.3 % (ref 37.5–51.0)
Hemoglobin: 13.3 g/dL (ref 13.0–17.7)
MCH: 30.8 pg (ref 26.6–33.0)
MCHC: 34.7 g/dL (ref 31.5–35.7)
MCV: 89 fL (ref 79–97)
Platelets: 220 10*3/uL (ref 150–450)
RBC: 4.32 x10E6/uL (ref 4.14–5.80)
RDW: 12.3 % (ref 11.6–15.4)
WBC: 5.5 10*3/uL (ref 3.4–10.8)

## 2021-03-06 LAB — TSH: TSH: 0.789 u[IU]/mL (ref 0.450–4.500)

## 2021-03-06 LAB — MAGNESIUM: Magnesium: 2 mg/dL (ref 1.6–2.3)

## 2021-03-11 ENCOUNTER — Telehealth: Payer: Self-pay | Admitting: Cardiology

## 2021-03-11 NOTE — Telephone Encounter (Signed)
Patient would like our help to get assistance paying for his Eliquis. Please call

## 2021-03-14 ENCOUNTER — Telehealth: Payer: Self-pay | Admitting: Cardiology

## 2021-03-14 ENCOUNTER — Other Ambulatory Visit: Payer: Self-pay

## 2021-03-14 DIAGNOSIS — I480 Paroxysmal atrial fibrillation: Secondary | ICD-10-CM

## 2021-03-14 MED ORDER — APIXABAN 2.5 MG PO TABS: 2.5000 mg | ORAL_TABLET | Freq: Two times a day (BID) | ORAL | 11 refills | Status: DC

## 2021-03-14 NOTE — Telephone Encounter (Signed)
°*  STAT* If patient is at the pharmacy, call can be transferred to refill team.   1. Which medications need to be refilled? (please list name of each medication and dose if known)  apixaban (ELIQUIS) 2.5 MG TABS tablet  2. Which pharmacy/location (including street and city if local pharmacy) is medication to be sent to? [place], [place]      3. Do they need a 30 day or 90 day supply? 30 with refills   Patient needs to have only this medication sent to this pharmacy. He is able to get a better price from this pharmacy while he is applying for the patient assistance program

## 2021-03-14 NOTE — Telephone Encounter (Signed)
Prescription refill request for Eliquis received. Indication:Afib Last office visit:1/23 Scr:0.7 Age: 81 Weight:60.5 kg  Prescription refilled

## 2021-03-15 DIAGNOSIS — Z23 Encounter for immunization: Secondary | ICD-10-CM | POA: Diagnosis not present

## 2021-03-22 ENCOUNTER — Telehealth: Payer: Self-pay

## 2021-03-22 NOTE — Telephone Encounter (Signed)
-----   Message from Rigoberto Noel, MD sent at 03/22/2021  9:47 AM EST ----- Needs FU OV with APP/ me ----- Message ----- From: Lauraine Rinne, NP Sent: 03/12/2020   1:46 PM EST To: Celene Squibb, MD, Rigoberto Noel, MD

## 2021-03-22 NOTE — Telephone Encounter (Signed)
ATC patient. No vm available.  

## 2021-03-26 NOTE — Telephone Encounter (Signed)
Called and spoke to patient. He requested to follow up with DrMarland Kitchen Elsworth Soho in [place]. Scheduled him for first available in April 2023. Will print and mail a pt reminder.

## 2021-04-02 DIAGNOSIS — H43391 Other vitreous opacities, right eye: Secondary | ICD-10-CM | POA: Diagnosis not present

## 2021-04-02 DIAGNOSIS — E119 Type 2 diabetes mellitus without complications: Secondary | ICD-10-CM | POA: Diagnosis not present

## 2021-04-02 DIAGNOSIS — H3581 Retinal edema: Secondary | ICD-10-CM | POA: Diagnosis not present

## 2021-04-02 DIAGNOSIS — H35371 Puckering of macula, right eye: Secondary | ICD-10-CM | POA: Diagnosis not present

## 2021-04-02 DIAGNOSIS — H43813 Vitreous degeneration, bilateral: Secondary | ICD-10-CM | POA: Diagnosis not present

## 2021-04-04 ENCOUNTER — Telehealth: Payer: Self-pay | Admitting: Cardiology

## 2021-04-04 NOTE — Telephone Encounter (Signed)
Pt c/o medication issue:  1. Name of Medication: apixaban (ELIQUIS) 2.5 MG TABS tablet  2. How are you currently taking this medication (dosage and times per day)? 1 tablet twice a day  3. Are you having a reaction (difficulty breathing--STAT)? no  4. What is your medication issue? Patient states there is a page of his eliquis assistance paperwork that needs to be filled out by his provider.

## 2021-04-04 NOTE — Telephone Encounter (Signed)
Spoke to patient. Patient had question about  patient assistance form.  Question answered . Instructed patient to bring  all documents and the completed form to the office . At that point information will be faxed to the office.

## 2021-04-16 ENCOUNTER — Other Ambulatory Visit: Payer: Self-pay | Admitting: Adult Health

## 2021-04-23 ENCOUNTER — Telehealth: Payer: Self-pay

## 2021-04-23 NOTE — Telephone Encounter (Signed)
Received fax from [name] stating that patients application was missing the house hold size. Called and spoke with patient who states that his house hold size is 2.  Called and spoke with [facility] patient assistance foundation- gave the information needed. Application is now in process.   Advised patient that as soon as we here back regarding the application someone will be in touch. Patient aware and verbalized understanding.

## 2021-05-03 ENCOUNTER — Ambulatory Visit (INDEPENDENT_AMBULATORY_CARE_PROVIDER_SITE_OTHER): Payer: Medicare Other

## 2021-05-03 DIAGNOSIS — I441 Atrioventricular block, second degree: Secondary | ICD-10-CM | POA: Diagnosis not present

## 2021-05-03 DIAGNOSIS — E782 Mixed hyperlipidemia: Secondary | ICD-10-CM | POA: Diagnosis not present

## 2021-05-03 DIAGNOSIS — E1165 Type 2 diabetes mellitus with hyperglycemia: Secondary | ICD-10-CM | POA: Diagnosis not present

## 2021-05-03 DIAGNOSIS — Z125 Encounter for screening for malignant neoplasm of prostate: Secondary | ICD-10-CM | POA: Diagnosis not present

## 2021-05-06 ENCOUNTER — Encounter: Payer: Self-pay | Admitting: Cardiology

## 2021-05-06 LAB — CUP PACEART REMOTE DEVICE CHECK
Battery Remaining Longevity: 71 mo
Battery Remaining Percentage: 74 %
Battery Voltage: 2.96 V
Brady Statistic AP VP Percent: 14 %
Brady Statistic AP VS Percent: 1 %
Brady Statistic AS VP Percent: 86 %
Brady Statistic AS VS Percent: 1 %
Brady Statistic RA Percent Paced: 14 %
Date Time Interrogation Session: 20230303104327
HighPow Impedance: 68 Ohm
Implantable Lead Implant Date: 20210305
Implantable Lead Implant Date: 20210305
Implantable Lead Implant Date: 20210305
Implantable Lead Location: 753858
Implantable Lead Location: 753859
Implantable Lead Location: 753860
Implantable Pulse Generator Implant Date: 20210305
Lead Channel Impedance Value: 1100 Ohm
Lead Channel Impedance Value: 460 Ohm
Lead Channel Impedance Value: 480 Ohm
Lead Channel Pacing Threshold Amplitude: 0.75 V
Lead Channel Pacing Threshold Amplitude: 1 V
Lead Channel Pacing Threshold Amplitude: 2 V
Lead Channel Pacing Threshold Pulse Width: 0.5 ms
Lead Channel Pacing Threshold Pulse Width: 0.5 ms
Lead Channel Pacing Threshold Pulse Width: 0.8 ms
Lead Channel Sensing Intrinsic Amplitude: 12 mV
Lead Channel Sensing Intrinsic Amplitude: 4.2 mV
Lead Channel Setting Pacing Amplitude: 1.75 V
Lead Channel Setting Pacing Amplitude: 2 V
Lead Channel Setting Pacing Amplitude: 2.5 V
Lead Channel Setting Pacing Pulse Width: 0.5 ms
Lead Channel Setting Pacing Pulse Width: 0.8 ms
Lead Channel Setting Sensing Sensitivity: 0.5 mV
Pulse Gen Serial Number: 111018177

## 2021-05-07 DIAGNOSIS — I420 Dilated cardiomyopathy: Secondary | ICD-10-CM | POA: Diagnosis not present

## 2021-05-07 DIAGNOSIS — E875 Hyperkalemia: Secondary | ICD-10-CM | POA: Diagnosis not present

## 2021-05-07 DIAGNOSIS — I441 Atrioventricular block, second degree: Secondary | ICD-10-CM | POA: Diagnosis not present

## 2021-05-07 DIAGNOSIS — I5042 Chronic combined systolic (congestive) and diastolic (congestive) heart failure: Secondary | ICD-10-CM | POA: Diagnosis not present

## 2021-05-07 DIAGNOSIS — I480 Paroxysmal atrial fibrillation: Secondary | ICD-10-CM | POA: Diagnosis not present

## 2021-05-07 DIAGNOSIS — E1165 Type 2 diabetes mellitus with hyperglycemia: Secondary | ICD-10-CM | POA: Diagnosis not present

## 2021-05-07 DIAGNOSIS — R911 Solitary pulmonary nodule: Secondary | ICD-10-CM | POA: Diagnosis not present

## 2021-05-07 DIAGNOSIS — E559 Vitamin D deficiency, unspecified: Secondary | ICD-10-CM | POA: Diagnosis not present

## 2021-05-07 DIAGNOSIS — E782 Mixed hyperlipidemia: Secondary | ICD-10-CM | POA: Diagnosis not present

## 2021-05-07 DIAGNOSIS — Z125 Encounter for screening for malignant neoplasm of prostate: Secondary | ICD-10-CM | POA: Diagnosis not present

## 2021-05-13 NOTE — Progress Notes (Signed)
Remote ICD transmission.   

## 2021-05-30 DIAGNOSIS — L910 Hypertrophic scar: Secondary | ICD-10-CM | POA: Diagnosis not present

## 2021-05-30 DIAGNOSIS — Z85828 Personal history of other malignant neoplasm of skin: Secondary | ICD-10-CM | POA: Diagnosis not present

## 2021-05-30 DIAGNOSIS — L570 Actinic keratosis: Secondary | ICD-10-CM | POA: Diagnosis not present

## 2021-05-30 DIAGNOSIS — D1801 Hemangioma of skin and subcutaneous tissue: Secondary | ICD-10-CM | POA: Diagnosis not present

## 2021-05-30 DIAGNOSIS — L821 Other seborrheic keratosis: Secondary | ICD-10-CM | POA: Diagnosis not present

## 2021-05-30 DIAGNOSIS — D225 Melanocytic nevi of trunk: Secondary | ICD-10-CM | POA: Diagnosis not present

## 2021-05-30 DIAGNOSIS — D2261 Melanocytic nevi of right upper limb, including shoulder: Secondary | ICD-10-CM | POA: Diagnosis not present

## 2021-06-10 ENCOUNTER — Ambulatory Visit (INDEPENDENT_AMBULATORY_CARE_PROVIDER_SITE_OTHER): Payer: Medicare Other | Admitting: Pulmonary Disease

## 2021-06-10 ENCOUNTER — Encounter: Payer: Self-pay | Admitting: Pulmonary Disease

## 2021-06-10 DIAGNOSIS — J432 Centrilobular emphysema: Secondary | ICD-10-CM | POA: Diagnosis not present

## 2021-06-10 DIAGNOSIS — R918 Other nonspecific abnormal finding of lung field: Secondary | ICD-10-CM | POA: Diagnosis not present

## 2021-06-10 NOTE — Patient Instructions (Addendum)
?  Nodules are stable ? ?Call if breathing worse ?

## 2021-06-10 NOTE — Progress Notes (Signed)
? ?  Subjective:  ? ? Patient ID: Richard Martin, male    DOB: 07/19/1940, 81 y.o.   MRN: [id number] ? ?HPI ? ?81 yo heavy ex-smoker for FU of copd & pulmonary nodule ?  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  Left heart catheterization showed no significant CAD.  He underwent cardiac MRI 02/2019 >> 'Transmural basal inferior LGE and basal inferoseptal midwall LGE. 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 ?-Paroxysmal atrial fibrillation on Eliquis ?  ?He smoked a pack per day for more than 50 years before he quit in 2002 ? ?I am seeing him after 2 years.  Breathing is stable, he is able to walk up to a mile daily.  Denies cough or sputum production ?Reviewed cardiology consultation from 03/2021 -he had no further episodes of syncope ?EF has recovered to 40 to 45% ?He is now on Eliquis ? ?Meds -developed rash with Spiriva ?. ? ?Significant tests/ events reviewed ? ?PFTs 09/2019 minimal airway obstruction, ratio 69, FEV1 83%, FVC 86%, TLC 93%, DLCO 15.1/63% ? ?CT chest 01/2021 stable nodules ? ?PET CT cardiac 03/2019 >>  Scattered bilateral pulmonary nodules, largest in the right lower lobe measuring up to 1.1 cm with minimal metabolic activity,  which may represent atelectasis ?  ?Nodule #1: Right upper lobe nodule measuring 6 mm  ? Nodule #2: Left upper lobe pulmonary nodule measuring 5 mm ? Nodule #3: Right lower lobe subpleural nodule measuring 1.1 cm  ?  ?  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 ?Review of Systems ?neg for any significant sore throat, dysphagia, itching, sneezing, nasal congestion or excess/ purulent secretions, fever, chills, sweats, unintended wt loss, pleuritic or exertional cp, hempoptysis,  orthopnea pnd or change in chronic leg swelling. Also denies presyncope, palpitations, heartburn, abdominal pain, nausea, vomiting, diarrhea or change in bowel or urinary habits, dysuria,hematuria, rash, arthralgias, visual complaints, headache, numbness weakness or ataxia. ? ?   ?Objective:  ? Physical Exam ? ?Gen. Pleasant, thin man, in no distress ?ENT - no thrush, no pallor/icterus,no post nasal drip ?Neck: No JVD, no thyromegaly, no carotid bruits ?Lungs: no use of accessory muscles, no dullness to percussion, clear without rales or rhonchi  ?Cardiovascular: Rhythm regular, heart sounds  normal, no murmurs or gallops, no peripheral edema ?Musculoskeletal: No deformities, no cyanosis or clubbing  ? ? ? ?   ?Assessment & Plan:  ? ? ?

## 2021-06-10 NOTE — Assessment & Plan Note (Signed)
-   Stable for 2 years with no new nodules interim and considered benign ?Cause unclear, sarcoidosis possible but if so does not appear to be active ?

## 2021-06-10 NOTE — Assessment & Plan Note (Signed)
Very minimal airway obstruction on PFTs ?He did not tolerate Spiriva due to rash ?We will keep him off bronchodilator and only use albuterol on as-needed basis ?

## 2021-07-30 ENCOUNTER — Other Ambulatory Visit: Payer: Self-pay | Admitting: Adult Health

## 2021-08-02 ENCOUNTER — Ambulatory Visit (INDEPENDENT_AMBULATORY_CARE_PROVIDER_SITE_OTHER): Payer: Medicare Other

## 2021-08-02 DIAGNOSIS — I441 Atrioventricular block, second degree: Secondary | ICD-10-CM

## 2021-08-05 LAB — CUP PACEART REMOTE DEVICE CHECK
Battery Remaining Longevity: 66 mo
Battery Remaining Percentage: 71 %
Battery Voltage: 2.96 V
Brady Statistic AP VP Percent: 13 %
Brady Statistic AP VS Percent: 1 %
Brady Statistic AS VP Percent: 86 %
Brady Statistic AS VS Percent: 1 %
Brady Statistic RA Percent Paced: 13 %
Date Time Interrogation Session: 20230602134032
HighPow Impedance: 70 Ohm
Implantable Lead Implant Date: 20210305
Implantable Lead Implant Date: 20210305
Implantable Lead Implant Date: 20210305
Implantable Lead Location: 753858
Implantable Lead Location: 753859
Implantable Lead Location: 753860
Implantable Pulse Generator Implant Date: 20210305
Lead Channel Impedance Value: 1075 Ohm
Lead Channel Impedance Value: 440 Ohm
Lead Channel Impedance Value: 440 Ohm
Lead Channel Pacing Threshold Amplitude: 0.75 V
Lead Channel Pacing Threshold Amplitude: 0.875 V
Lead Channel Pacing Threshold Amplitude: 2.375 V
Lead Channel Pacing Threshold Pulse Width: 0.5 ms
Lead Channel Pacing Threshold Pulse Width: 0.5 ms
Lead Channel Pacing Threshold Pulse Width: 0.8 ms
Lead Channel Sensing Intrinsic Amplitude: 12 mV
Lead Channel Sensing Intrinsic Amplitude: 3.6 mV
Lead Channel Setting Pacing Amplitude: 1.75 V
Lead Channel Setting Pacing Amplitude: 1.875
Lead Channel Setting Pacing Amplitude: 2.875
Lead Channel Setting Pacing Pulse Width: 0.5 ms
Lead Channel Setting Pacing Pulse Width: 0.8 ms
Lead Channel Setting Sensing Sensitivity: 0.5 mV
Pulse Gen Serial Number: 111018177

## 2021-08-09 NOTE — Progress Notes (Signed)
Remote ICD transmission.   

## 2021-09-10 ENCOUNTER — Telehealth: Payer: Self-pay | Admitting: Cardiology

## 2021-09-10 NOTE — Telephone Encounter (Signed)
Returned call to patient-patient reports he has episodes of low energy, extreme fatigue 2-4 times weekly.   He reports he takes all his medications as prescribed and monitors his blood pressure 2x daily.  He reports he notices this fatigue and low energy when his blood pressure is low 309-407W systolic.   He reports feeling well today and his BP was 126/72 HR 67.   He was wondering if any of his BP meds could be reduced to avoid these episodes.  He is aware he is on these medications for heart failure but would like to decrease if able to have more energy.  Advised would route to Dr. Gardiner Rhyme to review.   Current medications: Coreg 12.5 BID Farxiga 10 Lisinopril 20 Spiro 12.5 mg

## 2021-09-10 NOTE — Telephone Encounter (Signed)
  STAT if HR is under 50 or over 120 (normal HR is 60-100 beats per minute)  What is your heart rate?  114/64 116/65 104/65  Do you have a log of your heart rate readings (document readings)?   Do you have any other symptoms? Pt said, he' has no energy since his BP running low. He would like to ask if he need to change his medications

## 2021-09-11 MED ORDER — CARVEDILOL 6.25 MG PO TABS: 6.2500 mg | ORAL_TABLET | Freq: Two times a day (BID) | ORAL | 3 refills | Status: DC

## 2021-09-11 NOTE — Telephone Encounter (Signed)
Spoke to patient, aware of recommendations and verbalized understanding. Rx updated.

## 2021-09-11 NOTE — Telephone Encounter (Signed)
Recommend decreasing carvedilol to 6.25 mg twice daily

## 2021-10-08 DIAGNOSIS — E1165 Type 2 diabetes mellitus with hyperglycemia: Secondary | ICD-10-CM | POA: Diagnosis not present

## 2021-10-08 DIAGNOSIS — E782 Mixed hyperlipidemia: Secondary | ICD-10-CM | POA: Diagnosis not present

## 2021-10-08 DIAGNOSIS — Z125 Encounter for screening for malignant neoplasm of prostate: Secondary | ICD-10-CM | POA: Diagnosis not present

## 2021-10-15 DIAGNOSIS — E782 Mixed hyperlipidemia: Secondary | ICD-10-CM | POA: Diagnosis not present

## 2021-10-15 DIAGNOSIS — I480 Paroxysmal atrial fibrillation: Secondary | ICD-10-CM | POA: Diagnosis not present

## 2021-10-15 DIAGNOSIS — E1165 Type 2 diabetes mellitus with hyperglycemia: Secondary | ICD-10-CM | POA: Diagnosis not present

## 2021-10-15 DIAGNOSIS — I5042 Chronic combined systolic (congestive) and diastolic (congestive) heart failure: Secondary | ICD-10-CM | POA: Diagnosis not present

## 2021-10-15 DIAGNOSIS — R911 Solitary pulmonary nodule: Secondary | ICD-10-CM | POA: Diagnosis not present

## 2021-10-15 DIAGNOSIS — I420 Dilated cardiomyopathy: Secondary | ICD-10-CM | POA: Diagnosis not present

## 2021-10-15 DIAGNOSIS — I441 Atrioventricular block, second degree: Secondary | ICD-10-CM | POA: Diagnosis not present

## 2021-10-15 DIAGNOSIS — Z681 Body mass index (BMI) 19 or less, adult: Secondary | ICD-10-CM | POA: Diagnosis not present

## 2021-10-15 DIAGNOSIS — E875 Hyperkalemia: Secondary | ICD-10-CM | POA: Diagnosis not present

## 2021-10-15 DIAGNOSIS — E559 Vitamin D deficiency, unspecified: Secondary | ICD-10-CM | POA: Diagnosis not present

## 2021-10-15 DIAGNOSIS — Z125 Encounter for screening for malignant neoplasm of prostate: Secondary | ICD-10-CM | POA: Diagnosis not present

## 2021-10-26 ENCOUNTER — Other Ambulatory Visit: Payer: Self-pay | Admitting: Adult Health

## 2021-11-01 ENCOUNTER — Ambulatory Visit: Payer: Medicare Other

## 2021-11-06 ENCOUNTER — Encounter: Payer: Self-pay | Admitting: Physician Assistant

## 2021-11-06 ENCOUNTER — Ambulatory Visit: Payer: Medicare Other | Attending: Physician Assistant | Admitting: Physician Assistant

## 2021-11-06 VITALS — BP 120/62 | HR 78 | Ht 69.0 in | Wt 132.2 lb

## 2021-11-06 DIAGNOSIS — I5042 Chronic combined systolic (congestive) and diastolic (congestive) heart failure: Secondary | ICD-10-CM | POA: Diagnosis not present

## 2021-11-06 DIAGNOSIS — I441 Atrioventricular block, second degree: Secondary | ICD-10-CM | POA: Diagnosis not present

## 2021-11-06 DIAGNOSIS — E118 Type 2 diabetes mellitus with unspecified complications: Secondary | ICD-10-CM | POA: Diagnosis not present

## 2021-11-06 DIAGNOSIS — I480 Paroxysmal atrial fibrillation: Secondary | ICD-10-CM | POA: Insufficient documentation

## 2021-11-06 DIAGNOSIS — R918 Other nonspecific abnormal finding of lung field: Secondary | ICD-10-CM | POA: Diagnosis not present

## 2021-11-06 NOTE — Patient Instructions (Signed)
Medication Instructions:  Your physician recommends that you continue on your current medications as directed. Please refer to the Current Medication list given to you today.  *If you need a refill on your cardiac medications before your next appointment, please call your pharmacy*   Lab Work: TODAY: CMET, CBC  If you have labs (blood work) drawn today and your tests are completely normal, you will receive your results only by: [place] (if you have MyChart) OR A paper copy in the mail If you have any lab test that is abnormal or we need to change your treatment, we will call you to review the results.   Follow-Up: At [facility], you and your health needs are our priority.  As part of our continuing mission to provide you with exceptional heart care, we have created designated Provider Care Teams.  These Care Teams include your primary Cardiologist (physician) and Advanced Practice Providers (APPs -  Physician Assistants and Nurse Practitioners) who all work together to provide you with the care you need, when you need it.   Your next appointment:   6 month(s)  The format for your next appointment:   In Person  Provider:   Donato Heinz, MD

## 2021-11-06 NOTE — Progress Notes (Signed)
Cardiology Office Note:    Date:  11/06/2021   ID:  Richard Martin, DOB 05-28-1940, MRN [id number]  PCP:  Richard Martin, [place] Prov[facility] Cardiologist:  Richard Heinz, MD Electrophysiologist:  Richard [name], MD     Referring MD: Richard Squibb, MD   Chief Complaint:  Follow-up     History of Present Illness:   Richard Martin is a 81 y.o. 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  TTE was done on 01/04/2019, which showed EF 40 to 45%, with aneurysmal basal inferior segment.  TTE was also notable for grade 2 diastolic dysfunction, normal RV function, no significant valvular disease.  Given new systolic dysfunction, cardiac catheterization was done on 01/13/2019, which showed normal coronary arteries.  Cardiac MRI was done on 02/11/2019, which showed EF 35%, basal inferior aneurysm, transmural basal inferior LGE, basal inferoseptal mid wall LGE.  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  Zio patch x14 days was completed, which showed frequent second-degree Mobitz 2 AV block, with symptoms of dizziness corresponding to Mobitz 2 AV block.  He underwent cardiac PET at [facility] on 04/01/2019, which showed no active myocardial inflammation.  He underwent BiV ICD placement by Dr. Curt Martin on 05/06/2019.     Echocardiogram on 08/13/2020 showed EF 40 to 45%, inferior basal aneurysm, normal RV function, no significant valvular disease.  Patient last saw Dr. Gardiner Martin 03/05/21 and referred to pulmonary for nodules. Not on entresto due to hyperkalemia.  Patient called in with fatigue and hypotension 08/2021 and carvedilol decreased 6.25 mg bid. He feels much better. No further dizziness. Some days patient Richard jog up steps for exercise.  No chest pain or swelling. Drinks plenty of water.  Walks a mile a day when it's not too hot.    Past Medical History:  Diagnosis Date   Cardiac resynchronization therapy  defibrillator (CRT-D) in place    a. 05/2019 s/p [facility] HF CRT-D [id number] (ser# [id number]).   Chronic combined systolic (congestive) and diastolic (congestive) heart failure ([place])    a. 01/2019 Echo: EF 40-45%, focal basal inf aneurysmal region. Gr2 DD. Nl RV fxn. Mildly dil RA. Mod TR. Triv PR; b. 02/2019 cMRI: EF 35%.   Diabetes mellitus without complication (HCC)    LBBB (left bundle branch block)    NICM (nonischemic cardiomyopathy) ([place])    a. 01/2019 Echo: EF 40-45%; b. 01/2019 Cath: nl cors. EF 35-45%; c. 02/2019 cMRI: Basal inf and basal infsept midwall LGE. EF 35%; d. 05/2019 s/p [facility] HF CRT-D [id number] (ser# [id number]).   Current Medications: Current Meds  Medication Sig   acetaminophen (TYLENOL) 500 MG tablet Take 500 mg by mouth daily as needed for moderate pain or headache.   apixaban (ELIQUIS) 2.5 MG TABS tablet Take 1 tablet (2.5 mg total) by mouth 2 (two) times daily.   carvedilol (COREG) 6.25 MG tablet Take 1 tablet (6.25 mg total) by mouth 2 (two) times daily with a meal.   cholecalciferol (VITAMIN D3) 25 MCG (1000 UT) tablet Take 1,000 Units by mouth daily.   FARXIGA 10 MG TABS tablet Take 10 mg by mouth daily. Take 1 tablet by mouth every day   metFORMIN (GLUCOPHAGE) 500 MG tablet Take 1 tablet (500 mg total) by mouth 2 (two) times daily.   rosuvastatin (CRESTOR) 10 MG tablet Take 10 mg by mouth daily.    spironolactone (ALDACTONE) 25 MG tablet TAKE  1/2 TABLET BY MOUTH DAILY.   Tiotropium Bromide Monohydrate (SPIRIVA RESPIMAT) 2.5 MCG/ACT AERS Inhale 2 puffs into the lungs daily.   zinc gluconate 50 MG tablet Take 50 mg by mouth daily.   [DISCONTINUED] lisinopril (ZESTRIL) 20 MG tablet TAKE (1) TABLET BY MOUTH ONCE DAILY.    Allergies:   Crab [shellfish allergy]   Social History   Tobacco Use   Smoking status: Former    Types: Cigarettes    Quit date: 2002    Years since quitting: 21.6   Smokeless tobacco: Never   Tobacco comments:    Quit 2002   Substance Use Topics   Alcohol use: No   Drug use: No    Family Hx: The patient's family history includes Aneurysm in his mother; Heart attack in his father.  ROS   EKGs/Labs/Other Test Reviewed:    EKG:  EKG is  not ordered today.  T   Recent Labs: 03/05/2021: ALT 13; BUN 12; Creatinine, Ser 0.72; Hemoglobin 13.3; Magnesium 2.0; Platelets 220; Potassium 4.2; Sodium 138; TSH 0.789   Recent Lipid Panel No results for input(s): "CHOL", "TRIG", "HDL", "VLDL", "LDLCALC", "LDLDIRECT" in the last 8760 hours.   Prior CV Studies:      TTE 01/04/19:  1. Left ventricular ejection fraction, by visual estimation, is 40 to 45%. The left ventricle has normal function. There is no left ventricular hypertrophy.  2. Basal inferior segment is abnormal.  3. Left ventricular diastolic parameters are consistent with Grade II diastolic dysfunction (pseudonormalization).  4. Global right ventricle has normal systolic function.The right ventricular size is mildly enlarged. No increase in right ventricular wall thickness.  5. Left atrial size was normal.  6. Right atrial size was mildly dilated.  7. The mitral valve is normal in structure. No evidence of mitral valve regurgitation. No evidence of mitral stenosis.  8. The tricuspid valve is normal in structure. Tricuspid valve regurgitation moderate.  9. The aortic valve is normal in structure. Aortic valve regurgitation is not visualized. No evidence of aortic valve sclerosis or stenosis. 10. The pulmonic valve was normal in structure. Pulmonic valve regurgitation is trivial. 11. Mildly elevated pulmonary artery systolic pressure. 12. The inferior vena cava is normal in size with greater than 50% respiratory variability, suggesting right atrial pressure of 3 mmHg. 13. Focal basal inferior aneursymal region/infarct. 14. There is redundancy of the interatrial septum.   LHC 01/13/19: There is moderate left ventricular systolic dysfunction. LV end  diastolic pressure is normal. The left ventricular ejection fraction is 35-45% by visual estimate.   1. Normal coronary anatomy 2. Moderate LV dysfunction 3. inferobasal aneurysm 4. Normal LVEDP   CMR 02/11/19: 1. 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 While transmural LGE suggests ischemic etiology, it is not in a coronary distribution and patient had normal coronaries on recent cath. 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 2. Mild LV dilatation with moderate systolic dysfunction (EF 52%). Basal inferior aneurysm 3.  Normal RV size and systolic function (EF 84%)   Cardiac monitor 02/13/19: Frequent second degree Mobitz II AV block. Symptoms of dizziness corresponded to Mobitz II AV block   14 days of data recorded on Zio monitor. Patient had a min HR of 29 bpm, max HR of 123 bpm, and avg HR of 69 bpm. Predominant underlying rhythm was Sinus Rhythm.  No VT, SVT, atrial fibrillation, or pauses noted. Isolated  atrial and ventricular ectopy was rare (<1%).  8523 episodes of second degree Mobitz II AV block.  Patient triggered events, with symptoms reported of dizziness, corresponded to Mobitz II AV block.    Cardiac PET 04/01/19:  Scattered bilateral pulmonary nodules, largest in the right lower lobe measuring up to 1.1 cm with minimal metabolic activity,  which may represent atelectasis; recommend follow up chest CT in 3-6 months to assess for resolution.  FINAL COMMENTS  Rb-82 Cardiac PET/CT Myocardial Perfusion Imaging Study:   Abnormal rest perfusion in 4 out of 17 segments of the myocardium in a non "vascular" territory. This is consistent with  "scar/fibrosis" of the myocardium due to prior inflamation/infiltrative disease. It is not coronary artery disease related.   Rb-82 Cardiac PET/CT Functonal Imaging Study:   Sever decreased  in left ventricular function. Dilated left ventricle. These findings are compatible with a nonischemic  cardiomyopathy.   F-18 FDG Cardiac PET/CT Myocardial Metabolic Study:   No evidence of active inflamation of the myocardium. No evidence of active inflamatory process of the myocardium to suggest  active sarcoid or active myocarditis.   Limited F-18 FDG PET/CT of the Chest and Abdomen:   Note that evaluation is somewhat limited due to lack of IV contrast and slice thickness, within this limitation:   The thyroid is normal in appearance.   No mediastinal, hilar, or axillary lymphadenopathy. There are tiny mediastinal lymph nodes which do not meet size criteria for  pathologic enlargement. There are severe coronary artery and moderate thoracic aortic calcifications. No pericardial effusion.  The esophagus is normal in appearance.   No pleural effusion. No pneumothorax. Severe bilateral centrilobular and paraseptal emphysema. There are basal predominant  subpleural reticulations. There are scattered bilateral pulmonary nodules measuring up to 1.1 cm, for example:   Nodule #1: Right upper lobe nodule measuring 6 mm (series 3, image 80).  Nodule #2: Left upper lobe pulmonary nodule measuring 5 mm (series 3, image 77).  Nodule #3: Right lower lobe subpleural nodule measuring 1.1 cm (series 3, image 110) (SUV max 1.4).   Liver has a normal contour. Multifocal hypodense liver lesions without significant metabolic activity, likely cysts. Gallbladder,  pancreas, and bilateral adrenal glands are normal in appearance. Punctate splenic calcifications, likely sequela of granulomatous  disease.   Bilateral kidneys are normal in size without evidence of hydronephrosis. Right sided nephrolithiasis.   Visualized stomach, small bowel, large bowel is normal in caliber. No evidence of focal wall thickening.   Visualized abdominal aorta is normal in caliber. No suspicious osseous lesions.   CT  02/20/21 IMPRESSION: 1. Stable scattered subcentimeter subpleural pulmonary nodules. No new pulmonary nodule or mass. 2. Aortic Atherosclerosis (ICD10-I70.0) and Emphysema (ICD10-J43.9).     Electronically Signed   By: Iven Finn M.D.   On: 02/20/2021 22:21     Risk Assessment/Calculations/Metrics:    CHA2DS2-VASc Score = 5   This indicates a 7.2% annual risk of stroke. The patient's score is based upon: CHF History: 1 HTN History: 1 Diabetes History: 1 Stroke History: 0 Vascular Disease History: 0 Age Score: 2 Gender Score: 0             Physical Exam:    VS:  BP 120/62 (BP Location: Left Arm, Patient Position: Sitting, Cuff Size: Normal)   Pulse 78   Ht '5\' 9"'$  (1.753 m)   Wt 132 lb 3.2 oz (60 kg)   SpO2 96%   BMI 19.52 kg/m     Wt Readings from  Last 3 Encounters:  11/06/21 132 lb 3.2 oz (60 kg)  06/10/21 135 lb 12.8 oz (61.6 kg)  03/05/21 133 lb 6.4 oz (60.5 kg)    Physical Exam  GEN: Thin, in no acute distress  Neck: no JVD, carotid bruits, or masses Cardiac: irreg irreg 2/6 systolic murmur LSB Respiratory:  decreased breath sounds but clear to auscultation bilaterally, normal work of breathing GI: soft, nontender, nondistended, + BS Ext: without cyanosis, clubbing, or edema, Good distal pulses bilaterally Neuro:  Alert and Oriented x 3,  Psych: euthymic mood, full affect       ASSESSMENT & PLAN:   No problem-specific Assessment & Plan notes found for this encounter.   Chronic combined systolic and diastolic heart failure: Normal coronary arteries on cath.  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  No evidence of active myocardial inflammation on cardiac PET at [facility] on 04/01/2019.  Status post CRT-D placement by Dr. Curt Martin on 05/06/2019.  Echocardiogram on 08/13/2020 showed EF 40 to 45%, inferior basal aneurysm, normal RV function, no significant valvular disease. - Continue lisinopril 10 mg daily.   Has not been on Entresto due to hyperkalemia - Continue carvedilol 6.25 mg twice daily - Continue Farxiga 10 mg daily - Continue spironolactone 12.5 mg daily - Follows with EP for ICD management -Check CMP,, CBC   Paroxysmal atrial fibrillation: Noted on recent device interrogation.  CHA2DS2-VASc score 5 (CHF, hypertension, age x2, T2DM) -Recommend starting Eliquis 2.5 mg twice daily (reduced dose given age 7 and weight 60 kg).   -Continue Coreg 6.25 mg twice daily -Check CBC, CMET   Second-degree Mobitz 2 AV block: Presented with syncope.  Zio patch confirmed frequent episodes of second-degree Mobitz 2 AV block with symptoms of dizziness corresponding to periods of Mobitz 2. S/p CRT-D on 05/06/19   Hyperlipidemia: On rosuvastatin 10 mg daily.  LDL 80 on 05/2021   Type 2 diabetes: Well-controlled,   A1c 7.2%.  On Metformin and Farxiga   Pulmonary nodules/COPD: Scattered bilateral nodules noted on PET/CT at [facility], recommended chest CT follow-up in 3 to 6 months.  CT chest 07/07/2019 showed several small pulmonary nodules, along with moderate to severe emphysema.  Referred to pulmonology for further evaluation, had PFTs which showed mild emphysema.            Dispo:  No follow-ups on file.   Medication Adjustments/Labs and Tests Ordered: Current medicines are reviewed at length with the patient today.  Concerns regarding medicines are outlined above.  Tests Ordered: Orders Placed This Encounter  Procedures   Comprehensive metabolic panel   CBC   Medication Changes: No orders of the defined types were placed in this encounter.  Signed, Ermalinda Barrios, PA-C  11/06/2021 1:32 PM    [facility] [place], [place],   [id number] Phone: [phone]; Fax: [phone]

## 2021-11-07 LAB — COMPREHENSIVE METABOLIC PANEL
ALT: 12 IU/L (ref 0–44)
AST: 12 IU/L (ref 0–40)
Albumin/Globulin Ratio: 1.8 (ref 1.2–2.2)
Albumin: 4.7 g/dL (ref 3.8–4.8)
Alkaline Phosphatase: 54 IU/L (ref 44–121)
BUN/Creatinine Ratio: 14 (ref 10–24)
BUN: 10 mg/dL (ref 8–27)
Bilirubin Total: 0.5 mg/dL (ref 0.0–1.2)
CO2: 22 mmol/L (ref 20–29)
Calcium: 9.6 mg/dL (ref 8.6–10.2)
Chloride: 102 mmol/L (ref 96–106)
Creatinine, Ser: 0.72 mg/dL — ABNORMAL LOW (ref 0.76–1.27)
Globulin, Total: 2.6 g/dL (ref 1.5–4.5)
Glucose: 108 mg/dL — ABNORMAL HIGH (ref 70–99)
Potassium: 4.7 mmol/L (ref 3.5–5.2)
Sodium: 141 mmol/L (ref 134–144)
Total Protein: 7.3 g/dL (ref 6.0–8.5)
eGFR: 92 mL/min/{1.73_m2} (ref >59)

## 2021-11-07 LAB — CBC
Hematocrit: 39.7 % (ref 37.5–51.0)
Hemoglobin: 13.6 g/dL (ref 13.0–17.7)
MCH: 31.1 pg (ref 26.6–33.0)
MCHC: 34.3 g/dL (ref 31.5–35.7)
MCV: 91 fL (ref 79–97)
Platelets: 238 10*3/uL (ref 150–450)
RBC: 4.37 x10E6/uL (ref 4.14–5.80)
RDW: 12.5 % (ref 11.6–15.4)
WBC: 5.6 10*3/uL (ref 3.4–10.8)

## 2021-11-21 ENCOUNTER — Other Ambulatory Visit: Payer: Self-pay | Admitting: Cardiology

## 2021-11-21 ENCOUNTER — Other Ambulatory Visit: Payer: Self-pay | Admitting: Adult Health

## 2021-11-21 NOTE — Telephone Encounter (Signed)
Pt of Dr. Gardiner Rhyme. Please review for refill. Thank you!

## 2022-01-08 DIAGNOSIS — Z23 Encounter for immunization: Secondary | ICD-10-CM | POA: Diagnosis not present

## 2022-01-31 ENCOUNTER — Ambulatory Visit (INDEPENDENT_AMBULATORY_CARE_PROVIDER_SITE_OTHER): Payer: Medicare Other

## 2022-01-31 DIAGNOSIS — I428 Other cardiomyopathies: Secondary | ICD-10-CM | POA: Diagnosis not present

## 2022-02-01 LAB — CUP PACEART REMOTE DEVICE CHECK
Battery Remaining Longevity: 62 mo
Battery Remaining Percentage: 65 %
Battery Voltage: 2.96 V
Brady Statistic AP VP Percent: 12 %
Brady Statistic AP VS Percent: 1 %
Brady Statistic AS VP Percent: 88 %
Brady Statistic AS VS Percent: 1 %
Brady Statistic RA Percent Paced: 11 %
Date Time Interrogation Session: 20231201021117
HighPow Impedance: 69 Ohm
Implantable Lead Connection Status: 753985
Implantable Lead Connection Status: 753985
Implantable Lead Connection Status: 753985
Implantable Lead Implant Date: 20210305
Implantable Lead Implant Date: 20210305
Implantable Lead Implant Date: 20210305
Implantable Lead Location: 753858
Implantable Lead Location: 753859
Implantable Lead Location: 753860
Implantable Pulse Generator Implant Date: 20210305
Lead Channel Impedance Value: 1175 Ohm
Lead Channel Impedance Value: 480 Ohm
Lead Channel Impedance Value: 480 Ohm
Lead Channel Pacing Threshold Amplitude: 0.75 V
Lead Channel Pacing Threshold Amplitude: 1 V
Lead Channel Pacing Threshold Amplitude: 1.75 V
Lead Channel Pacing Threshold Pulse Width: 0.5 ms
Lead Channel Pacing Threshold Pulse Width: 0.5 ms
Lead Channel Pacing Threshold Pulse Width: 0.8 ms
Lead Channel Sensing Intrinsic Amplitude: 12 mV
Lead Channel Sensing Intrinsic Amplitude: 2.3 mV
Lead Channel Setting Pacing Amplitude: 1.75 V
Lead Channel Setting Pacing Amplitude: 2 V
Lead Channel Setting Pacing Amplitude: 2.25 V
Lead Channel Setting Pacing Pulse Width: 0.5 ms
Lead Channel Setting Pacing Pulse Width: 0.8 ms
Lead Channel Setting Sensing Sensitivity: 0.5 mV
Pulse Gen Serial Number: 111018177
Zone Setting Status: 755011

## 2022-02-03 ENCOUNTER — Telehealth: Payer: Self-pay | Admitting: Cardiology

## 2022-02-03 NOTE — Telephone Encounter (Signed)
  Pt c/o medication issue:  1. Name of Medication: covid medicine   2. How are you currently taking this medication (dosage and times per day)?   3. Are you having a reaction (difficulty breathing--STAT)?   4. What is your medication issue? Pt said, him and his wife both tested positive for covid for the first time. He would like to know what covid medication Dr. Gardiner Rhyme can prescribe him that he can take with his heart medications

## 2022-02-03 NOTE — Telephone Encounter (Signed)
Paxlovid would interact with multiple meds he takes, recommend prescribing molnupiravir to avoid drug interactions.

## 2022-02-03 NOTE — Telephone Encounter (Signed)
Spoke with patient and gave him the recommendation of PharmD: "Paxlovid would interact with multiple meds he takes, recommend prescribing molnupiravir to avoid drug interactions." Patient stated he got COVID over the weekend from his wife. He stated he is afebrile and BP and P are normal. Recommended that he call his PCP for direction. He verbalized understanding.

## 2022-02-04 DIAGNOSIS — U071 COVID-19: Secondary | ICD-10-CM | POA: Diagnosis not present

## 2022-02-04 NOTE — Telephone Encounter (Signed)
Agree with plan, recommend contact PCP for COVID treatment

## 2022-02-11 DIAGNOSIS — E782 Mixed hyperlipidemia: Secondary | ICD-10-CM | POA: Diagnosis not present

## 2022-02-11 DIAGNOSIS — E1165 Type 2 diabetes mellitus with hyperglycemia: Secondary | ICD-10-CM | POA: Diagnosis not present

## 2022-02-18 DIAGNOSIS — I480 Paroxysmal atrial fibrillation: Secondary | ICD-10-CM | POA: Diagnosis not present

## 2022-02-18 DIAGNOSIS — I110 Hypertensive heart disease with heart failure: Secondary | ICD-10-CM | POA: Diagnosis not present

## 2022-02-18 DIAGNOSIS — I420 Dilated cardiomyopathy: Secondary | ICD-10-CM | POA: Diagnosis not present

## 2022-02-18 DIAGNOSIS — R911 Solitary pulmonary nodule: Secondary | ICD-10-CM | POA: Diagnosis not present

## 2022-02-18 DIAGNOSIS — Z0000 Encounter for general adult medical examination without abnormal findings: Secondary | ICD-10-CM | POA: Diagnosis not present

## 2022-02-18 DIAGNOSIS — I441 Atrioventricular block, second degree: Secondary | ICD-10-CM | POA: Diagnosis not present

## 2022-02-18 DIAGNOSIS — E782 Mixed hyperlipidemia: Secondary | ICD-10-CM | POA: Diagnosis not present

## 2022-02-18 DIAGNOSIS — I5042 Chronic combined systolic (congestive) and diastolic (congestive) heart failure: Secondary | ICD-10-CM | POA: Diagnosis not present

## 2022-02-18 DIAGNOSIS — E1169 Type 2 diabetes mellitus with other specified complication: Secondary | ICD-10-CM | POA: Diagnosis not present

## 2022-02-18 DIAGNOSIS — E559 Vitamin D deficiency, unspecified: Secondary | ICD-10-CM | POA: Diagnosis not present

## 2022-02-18 DIAGNOSIS — E875 Hyperkalemia: Secondary | ICD-10-CM | POA: Diagnosis not present

## 2022-02-18 NOTE — Progress Notes (Signed)
Remote ICD transmission.   

## 2022-04-04 ENCOUNTER — Other Ambulatory Visit: Payer: Self-pay | Admitting: Adult Health

## 2022-04-23 ENCOUNTER — Other Ambulatory Visit: Payer: Self-pay | Admitting: Cardiology

## 2022-04-23 DIAGNOSIS — I480 Paroxysmal atrial fibrillation: Secondary | ICD-10-CM

## 2022-04-23 NOTE — Telephone Encounter (Signed)
Prescription refill request for Eliquis received. Indication: Afib  Last office visit: 11/06/21 [facility])  Scr: 0.72 (11/06/21)  Age: 82 Weight: 60kg  Appropriate dose. Refill sent.

## 2022-05-02 ENCOUNTER — Ambulatory Visit: Payer: Medicare Other

## 2022-05-02 DIAGNOSIS — I428 Other cardiomyopathies: Secondary | ICD-10-CM

## 2022-05-02 LAB — CUP PACEART REMOTE DEVICE CHECK
Battery Remaining Longevity: 60 mo
Battery Remaining Percentage: 62 %
Battery Voltage: 2.95 V
Brady Statistic AP VP Percent: 11 %
Brady Statistic AP VS Percent: 1 %
Brady Statistic AS VP Percent: 89 %
Brady Statistic AS VS Percent: 1 %
Brady Statistic RA Percent Paced: 10 %
Date Time Interrogation Session: 20240301020501
HighPow Impedance: 72 Ohm
Implantable Lead Connection Status: 753985
Implantable Lead Connection Status: 753985
Implantable Lead Connection Status: 753985
Implantable Lead Implant Date: 20210305
Implantable Lead Implant Date: 20210305
Implantable Lead Implant Date: 20210305
Implantable Lead Location: 753858
Implantable Lead Location: 753859
Implantable Lead Location: 753860
Implantable Pulse Generator Implant Date: 20210305
Lead Channel Impedance Value: 1125 Ohm
Lead Channel Impedance Value: 480 Ohm
Lead Channel Impedance Value: 480 Ohm
Lead Channel Pacing Threshold Amplitude: 0.875 V
Lead Channel Pacing Threshold Amplitude: 0.875 V
Lead Channel Pacing Threshold Amplitude: 1.875 V
Lead Channel Pacing Threshold Pulse Width: 0.5 ms
Lead Channel Pacing Threshold Pulse Width: 0.5 ms
Lead Channel Pacing Threshold Pulse Width: 0.8 ms
Lead Channel Sensing Intrinsic Amplitude: 12 mV
Lead Channel Sensing Intrinsic Amplitude: 5 mV
Lead Channel Setting Pacing Amplitude: 1.875
Lead Channel Setting Pacing Amplitude: 1.875
Lead Channel Setting Pacing Amplitude: 2.375
Lead Channel Setting Pacing Pulse Width: 0.5 ms
Lead Channel Setting Pacing Pulse Width: 0.8 ms
Lead Channel Setting Sensing Sensitivity: 0.5 mV
Pulse Gen Serial Number: 111018177
Zone Setting Status: 755011

## 2022-05-11 NOTE — Progress Notes (Unsigned)
Cardiology Office Note:    Date:  05/14/2022   ID:  Richard Martin, DOB 10-05-1940, MRN [id number]  PCP:  Celene Squibb, MD  Cardiologist:  Donato Heinz, MD  Electrophysiologist:  Constance Haw, MD   Referring MD: Celene Squibb, MD   Chief Complaint  Patient presents with   Congestive Heart Failure    History of Present Illness:    Richard Martin is a 82 y.o. male with a hx of diabetes, chronic combined systolic and diastolic heart failure and second-degree Mobitz 2 AV block presents for follow-up.  He was initially seen on 12/31/2018 after being referred by Dr. Nevada Crane for evaluation of EKG abnormalities and syncopal episode.  He had a episode of syncope where he was walking to his kitchen and had sudden loss of consciousness.  He was seen in clinic on 12/31/2018, and rhythm strip demonstrated intermittent 2:1 AV block, with a wide QRS.  He was referred to EP for evaluation for pacemaker, and an echocardiogram was ordered.  TTE was done on 01/04/2019, which showed EF 40 to 45%, with aneurysmal basal inferior segment.  TTE was also notable for grade 2 diastolic dysfunction, normal RV function, no significant valvular disease.  Given new systolic dysfunction, cardiac catheterization was done on 01/13/2019, which showed normal coronary arteries.  Cardiac MRI was done on 02/11/2019, which showed EF 35%, basal inferior aneurysm, transmural basal inferior LGE, basal inferoseptal mid wall LGE.  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  Zio patch x14 days was completed, which showed frequent second-degree Mobitz 2 AV block, with symptoms of dizziness corresponding to Mobitz 2 AV block.  He underwent cardiac PET at [facility] on 04/01/2019, which showed no active myocardial inflammation.  He underwent BiV ICD placement by Dr. Curt Bears on 05/06/2019.  Since CRT-D placed, reports lightheadedness/syncope has resolved and dyspnea/fatigue significantly improved.  Echocardiogram on 08/13/2020 showed EF 40  to 45%, inferior basal aneurysm, normal RV function, no significant valvular disease.  Since last clinic visit, he reports he is doing okay.  Previously having issues with lightheadedness but none recently.  Denies any chest pain or dyspnea  Does report he feels more tired than he used to.  He walks for about 20 minutes daily if weather is nice.  He denies any bleeding issues on Eliquis.  Wt Readings from Last 3 Encounters:  05/12/22 129 lb 12.8 oz (58.9 kg)  11/06/21 132 lb 3.2 oz (60 kg)  06/10/21 135 lb 12.8 oz (61.6 kg)   BP Readings from Last 3 Encounters:  05/12/22 126/74  11/06/21 120/62  06/10/21 134/78    Past Medical History:  Diagnosis Date   Cardiac resynchronization therapy defibrillator (CRT-D) in place    a. 05/2019 s/p SJM Gallant HF CRT-D [id number] (ser# [id number]).   Chronic combined systolic (congestive) and diastolic (congestive) heart failure ([place])    a. 01/2019 Echo: EF 40-45%, focal basal inf aneurysmal region. Gr2 DD. Nl RV fxn. Mildly dil RA. Mod TR. Triv PR; b. 02/2019 cMRI: EF 35%.   Diabetes mellitus without complication (HCC)    LBBB (left bundle branch block)    NICM (nonischemic cardiomyopathy) ([place])    a. 01/2019 Echo: EF 40-45%; b. 01/2019 Cath: nl cors. EF 35-45%; c. 02/2019 cMRI: Basal inf and basal infsept midwall LGE. EF 35%; d. 05/2019 s/p SJM Gallant HF CRT-D [id number] (ser# [id number]).    Past Surgical History:  Procedure Laterality Date   BIV ICD INSERTION CRT-D N/A 05/06/2019   Procedure: BIV ICD  INSERTION CRT-D;  Surgeon: Constance Haw, MD;  Location: [place] [facility];  Service: Cardiovascular;  Laterality: N/A;   LEFT HEART CATH AND CORONARY ANGIOGRAPHY N/A 01/13/2019   Procedure: LEFT HEART CATH AND CORONARY ANGIOGRAPHY;  Surgeon: Martinique, Peter M, MD;  Location: [place] [facility];  Service: Cardiovascular;  Laterality: N/A;    Current Medications: Current Meds  Medication Sig   acetaminophen (TYLENOL) 500 MG tablet Take 500 mg  by mouth daily as needed for moderate pain or headache.   apixaban (ELIQUIS) 2.5 MG TABS tablet TAKE (1) TABLET TWICE DAILY.   carvedilol (COREG) 12.5 MG tablet TAKE (1) TABLET BY MOUTH TWICE DAILY WITH MEALS.   cholecalciferol (VITAMIN D3) 25 MCG (1000 UT) tablet Take 1,000 Units by mouth daily.   FARXIGA 10 MG TABS tablet Take 10 mg by mouth daily. Take 1 tablet by mouth every day   lisinopril (ZESTRIL) 20 MG tablet TAKE (1) TABLET BY MOUTH ONCE DAILY.   metFORMIN (GLUCOPHAGE) 500 MG tablet Take 1 tablet (500 mg total) by mouth 2 (two) times daily.   rosuvastatin (CRESTOR) 10 MG tablet Take 10 mg by mouth daily.    spironolactone (ALDACTONE) 25 MG tablet TAKE 1/2 TABLET BY MOUTH DAILY.   zinc gluconate 50 MG tablet Take 50 mg by mouth daily.   [DISCONTINUED] carvedilol (COREG) 6.25 MG tablet Take 1 tablet (6.25 mg total) by mouth 2 (two) times daily with a meal.   [DISCONTINUED] lisinopril (ZESTRIL) 10 MG tablet Take by mouth.     Allergies:   Otho Darner allergy]   Social History   Socioeconomic History   Marital status: Married    Spouse name: Not on file   Number of children: Not on file   Years of education: Not on file   Highest education level: Not on file  Occupational History   Not on file  Tobacco Use   Smoking status: Former    Types: Cigarettes    Quit date: 2002    Years since quitting: 22.2   Smokeless tobacco: Never   Tobacco comments:    Quit 2002  Substance and Sexual Activity   Alcohol use: No   Drug use: No   Sexual activity: Not on file  Other Topics Concern   Not on file  Social History Narrative   Not on file   Social Determinants of Health   Financial Resource Strain: Not on file  Food Insecurity: Not on file  Transportation Needs: Not on file  Physical Activity: Not on file  Stress: Not on file  Social Connections: Not on file     Family History: Father died of MI at age 31  ROS:   Please see the history of present illness.    All  other systems reviewed and are negative.  EKGs/Labs/Other Studies Reviewed:    The following studies were reviewed today:   EKG:   03/05/21: Sinus rhythm, BiV paced, rate 72 05/12/22: atrial sensed, BiV paced, rate 79    TTE 01/04/19:  1. Left ventricular ejection fraction, by visual estimation, is 40 to 45%. The left ventricle has normal function. There is no left ventricular hypertrophy.  2. Basal inferior segment is abnormal.  3. Left ventricular diastolic parameters are consistent with Grade II diastolic dysfunction (pseudonormalization).  4. Global right ventricle has normal systolic function.The right ventricular size is mildly enlarged. No increase in right ventricular wall thickness.  5. Left atrial size was normal.  6. Right atrial size was mildly  dilated.  7. The mitral valve is normal in structure. No evidence of mitral valve regurgitation. No evidence of mitral stenosis.  8. The tricuspid valve is normal in structure. Tricuspid valve regurgitation moderate.  9. The aortic valve is normal in structure. Aortic valve regurgitation is not visualized. No evidence of aortic valve sclerosis or stenosis. 10. The pulmonic valve was normal in structure. Pulmonic valve regurgitation is trivial. 11. Mildly elevated pulmonary artery systolic pressure. 12. The inferior vena cava is normal in size with greater than 50% respiratory variability, suggesting right atrial pressure of 3 mmHg. 13. Focal basal inferior aneursymal region/infarct. 14. There is redundancy of the interatrial septum.  LHC 01/13/19: There is moderate left ventricular systolic dysfunction. LV end diastolic pressure is normal. The left ventricular ejection fraction is 35-45% by visual estimate.   1. Normal coronary anatomy 2. Moderate LV dysfunction 3. inferobasal aneurysm 4. Normal LVEDP   CMR 02/11/19: 1. 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 While transmural LGE suggests ischemic  etiology, it is not in a coronary distribution and patient had normal coronaries on recent cath. 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 2. Mild LV dilatation with moderate systolic dysfunction (EF [id number]). Basal inferior aneurysm 3.  Normal RV size and systolic function (EF [id number])  Cardiac monitor 02/13/19: Frequent second degree Mobitz II AV block. Symptoms of dizziness corresponded to Mobitz II AV block   14 days of data recorded on Zio monitor. Patient had a min HR of 29 bpm, max HR of 123 bpm, and avg HR of 69 bpm. Predominant underlying rhythm was Sinus Rhythm.  No VT, SVT, atrial fibrillation, or pauses noted. Isolated atrial and ventricular ectopy was rare (<1%).  8523 episodes of second degree Mobitz II AV block.  Patient triggered events, with symptoms reported of dizziness, corresponded to Mobitz II AV block.    Cardiac PET 04/01/19:  Scattered bilateral pulmonary nodules, largest in the right lower lobe measuring up to 1.1 cm with minimal metabolic activity,  which may represent atelectasis; recommend follow up chest CT in 3-6 months to assess for resolution.  FINAL COMMENTS  Rb-82 Cardiac PET/CT Myocardial Perfusion Imaging Study:   Abnormal rest perfusion in 4 out of 17 segments of the myocardium in a non "vascular" territory. This is consistent with  "scar/fibrosis" of the myocardium due to prior inflamation/infiltrative disease. It is not coronary artery disease related.   Rb-82 Cardiac PET/CT Functonal Imaging Study:   Sever decreased in left ventricular function. Dilated left ventricle. These findings are compatible with a nonischemic  cardiomyopathy.   F-18 FDG Cardiac PET/CT Myocardial Metabolic Study:   No evidence of active inflamation of the myocardium. No evidence of active inflamatory process of the myocardium to suggest  active sarcoid or  active myocarditis.   Limited F-18 FDG PET/CT of the Chest and Abdomen:   Note that evaluation is somewhat limited due to lack of IV contrast and slice thickness, within this limitation:   The thyroid is normal in appearance.   No mediastinal, hilar, or axillary lymphadenopathy. There are tiny mediastinal lymph nodes which do not meet size criteria for  pathologic enlargement. There are severe coronary artery and moderate thoracic aortic calcifications. No pericardial effusion.  The esophagus is normal in appearance.   No pleural effusion. No pneumothorax. Severe bilateral centrilobular and paraseptal emphysema. There are basal predominant  subpleural reticulations. There are scattered bilateral  pulmonary nodules measuring up to 1.1 cm, for example:   Nodule #1: Right upper lobe nodule measuring 6 mm (series 3, image 80).  Nodule #2: Left upper lobe pulmonary nodule measuring 5 mm (series 3, image 77).  Nodule #3: Right lower lobe subpleural nodule measuring 1.1 cm (series 3, image 110) (SUV max 1.4).   Liver has a normal contour. Multifocal hypodense liver lesions without significant metabolic activity, likely cysts. Gallbladder,  pancreas, and bilateral adrenal glands are normal in appearance. Punctate splenic calcifications, likely sequela of granulomatous  disease.   Bilateral kidneys are normal in size without evidence of hydronephrosis. Right sided nephrolithiasis.   Visualized stomach, small bowel, large bowel is normal in caliber. No evidence of focal wall thickening.   Visualized abdominal aorta is normal in caliber. No suspicious osseous lesions.  Recent Labs: 11/06/2021: ALT 12 05/12/2022: BUN 14; Creatinine, Ser 0.68; Hemoglobin 13.6; Magnesium 2.1; Platelets 267; Potassium 4.9; Sodium 142  Recent Lipid Panel    Component Value Date/Time   CHOL 123 01/10/2019 1027   TRIG 66 01/10/2019 1027   HDL 52 01/10/2019 1027   CHOLHDL 2.4 01/10/2019 1027   LDLCALC 57 01/10/2019  1027    Physical Exam:    VS:  BP 126/74 (BP Location: Left Arm, Patient Position: Sitting, Cuff Size: Normal)   Pulse 74   Ht '5\' 9"'$  (1.753 m)   Wt 129 lb 12.8 oz (58.9 kg)   SpO2 96%   BMI 19.17 kg/m     Wt Readings from Last 3 Encounters:  05/12/22 129 lb 12.8 oz (58.9 kg)  11/06/21 132 lb 3.2 oz (60 kg)  06/10/21 135 lb 12.8 oz (61.6 kg)     GEN:  Well nourished, well developed in no acute distress HEENT: Normal NECK: No JVD CARDIAC: RRR, no murmurs, rubs, gallops RESPIRATORY:  Clear to auscultation without rales, wheezing or rhonchi  ABDOMEN: Soft, non-tender, non-distended MUSCULOSKELETAL:  No edema SKIN: Warm and dry NEUROLOGIC:  Alert and oriented x 3 PSYCHIATRIC:  Normal affect   ASSESSMENT:    1. Chronic combined systolic and diastolic CHF (congestive heart failure) (HCC)   2. Paroxysmal atrial fibrillation ([place])   3. Hyperlipidemia, unspecified hyperlipidemia type   4. Second degree Mobitz II AV block      PLAN:    Chronic combined systolic and diastolic heart failure: Normal coronary arteries on cath.  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  No evidence of active myocardial inflammation on cardiac PET at [facility] on 04/01/2019.  Status post CRT-D placement by Dr. Curt Bears on 05/06/2019.  Echocardiogram on 08/13/2020 showed EF 40 to 45%, inferior basal aneurysm, normal RV function, no significant valvular disease. - Continue lisinopril 20 mg daily.  Has not been on Entresto due to hyperkalemia - Continue carvedilol 12.5 mg twice daily - Continue Farxiga 10 mg daily - Continue spironolactone 12.5 mg daily - Follows with EP for ICD management - Check BMP, magnesium - Check echocardiogram to monitor systolic function  Paroxysmal atrial fibrillation: Noted on device interrogation.  CHA2DS2-VASc score 5 (CHF, hypertension, age x2, T2DM) -Continue Eliquis 2.5 mg twice daily (reduced dose given age 58 and weight  60 kg).   -Continue Coreg 12.5 mg twice daily   Second-degree Mobitz 2 AV block: Presented with syncope.  Zio patch confirmed frequent episodes of second-degree Mobitz 2 AV block with symptoms of dizziness corresponding to periods of Mobitz 2. S/p CRT-D on 05/06/19   Hyperlipidemia: On rosuvastatin  10 mg daily.  LDL 93 on 02/11/2022   Type 2 diabetes: A1c 7.4% on 02/11/2022.  On Metformin and Farxiga  Pulmonary nodules/COPD: Scattered bilateral nodules noted on PET/[facility] at [facility], recommended chest CT follow-up in 3 to 6 months.  CT chest 07/07/2019 showed several small pulmonary nodules, along with moderate to severe emphysema.  Referred to pulmonology for further evaluation, had PFTs which showed mild emphysema.  CT chest 01/2021 showed stable pulmonary nodules   RTC in 6 months    Medication Adjustments/Labs and Tests Ordered: Current medicines are reviewed at length with the patient today.  Concerns regarding medicines are outlined above.  Orders Placed This Encounter  Procedures   Basic metabolic panel   CBC   Magnesium   EKG 12-Lead   ECHOCARDIOGRAM COMPLETE   No orders of the defined types were placed in this encounter.   Patient Instructions  Medication Instructions:  Call when you get home to confirm medication (carvedilol dosage and lisinopril dosage)  *If you need a refill on your cardiac medications before your next appointment, please call your pharmacy*  Lab Work: BMET, Mag, CBC today  If you have labs (blood work) drawn today and your tests are completely normal, you will receive your results only by: [place] (if you have MyChart) OR A paper copy in the mail If you have any lab test that is abnormal or we need to change your treatment, we will call you to review the results.  Testing/Procedures: Your physician has requested that you have an echocardiogram. Echocardiography is a painless test that uses sound waves to create images of your heart. It provides  your doctor with information about the size and shape of your heart and how well your heart's chambers and valves are working. This procedure takes approximately one hour. There are no restrictions for this procedure. Please do NOT wear cologne, perfume, aftershave, or lotions (deodorant is allowed). Please arrive 15 minutes prior to your appointment time.  Follow-Up: At [facility], you and your health needs are our priority.  As part of our continuing mission to provide you with exceptional heart care, we have created designated Provider Care Teams.  These Care Teams include your primary Cardiologist (physician) and Advanced Practice Providers (APPs -  Physician Assistants and Nurse Practitioners) who all work together to provide you with the care you need, when you need it.  We recommend signing up for the patient portal called "MyChart".  Sign up information is provided on this After Visit Summary.  MyChart is used to connect with patients for Virtual Visits (Telemedicine).  Patients are able to view lab/test results, encounter notes, upcoming appointments, etc.  Non-urgent messages can be sent to your provider as well.   To learn more about what you can do with MyChart, go to [email or web address].    Your next appointment:   6 month(s)  Provider:   Donato Heinz, MD        Signed, Donato Heinz, MD  05/14/2022 2:29 PM    [place]

## 2022-05-12 ENCOUNTER — Ambulatory Visit: Payer: Medicare Other | Attending: Cardiology | Admitting: Cardiology

## 2022-05-12 ENCOUNTER — Encounter: Payer: Self-pay | Admitting: Cardiology

## 2022-05-12 VITALS — BP 126/74 | HR 74 | Ht 69.0 in | Wt 129.8 lb

## 2022-05-12 DIAGNOSIS — I441 Atrioventricular block, second degree: Secondary | ICD-10-CM

## 2022-05-12 DIAGNOSIS — E785 Hyperlipidemia, unspecified: Secondary | ICD-10-CM | POA: Diagnosis not present

## 2022-05-12 DIAGNOSIS — I5042 Chronic combined systolic (congestive) and diastolic (congestive) heart failure: Secondary | ICD-10-CM

## 2022-05-12 DIAGNOSIS — I480 Paroxysmal atrial fibrillation: Secondary | ICD-10-CM | POA: Diagnosis not present

## 2022-05-12 NOTE — Patient Instructions (Signed)
Medication Instructions:  Call when you get home to confirm medication (carvedilol dosage and lisinopril dosage)  *If you need a refill on your cardiac medications before your next appointment, please call your pharmacy*  Lab Work: BMET, Mag, CBC today  If you have labs (blood work) drawn today and your tests are completely normal, you will receive your results only by: [place] (if you have MyChart) OR A paper copy in the mail If you have any lab test that is abnormal or we need to change your treatment, we will call you to review the results.  Testing/Procedures: Your physician has requested that you have an echocardiogram. Echocardiography is a painless test that uses sound waves to create images of your heart. It provides your doctor with information about the size and shape of your heart and how well your heart's chambers and valves are working. This procedure takes approximately one hour. There are no restrictions for this procedure. Please do NOT wear cologne, perfume, aftershave, or lotions (deodorant is allowed). Please arrive 15 minutes prior to your appointment time.  Follow-Up: At [facility], you and your health needs are our priority.  As part of our continuing mission to provide you with exceptional heart care, we have created designated Provider Care Teams.  These Care Teams include your primary Cardiologist (physician) and Advanced Practice Providers (APPs -  Physician Assistants and Nurse Practitioners) who all work together to provide you with the care you need, when you need it.  We recommend signing up for the patient portal called "MyChart".  Sign up information is provided on this After Visit Summary.  MyChart is used to connect with patients for Virtual Visits (Telemedicine).  Patients are able to view lab/test results, encounter notes, upcoming appointments, etc.  Non-urgent messages can be sent to your provider as well.   To learn more about what you  can do with MyChart, go to [email or web address].    Your next appointment:   6 month(s)  Provider:   Donato Heinz, MD

## 2022-05-13 ENCOUNTER — Telehealth: Payer: Self-pay | Admitting: Cardiology

## 2022-05-13 LAB — CBC
Hematocrit: 38.9 % (ref 37.5–51.0)
Hemoglobin: 13.6 g/dL (ref 13.0–17.7)
MCH: 30.4 pg (ref 26.6–33.0)
MCHC: 35 g/dL (ref 31.5–35.7)
MCV: 87 fL (ref 79–97)
Platelets: 267 10*3/uL (ref 150–450)
RBC: 4.47 x10E6/uL (ref 4.14–5.80)
RDW: 11.9 % (ref 11.6–15.4)
WBC: 5.6 10*3/uL (ref 3.4–10.8)

## 2022-05-13 LAB — BASIC METABOLIC PANEL
BUN/Creatinine Ratio: 21 (ref 10–24)
BUN: 14 mg/dL (ref 8–27)
CO2: 24 mmol/L (ref 20–29)
Calcium: 10 mg/dL (ref 8.6–10.2)
Chloride: 100 mmol/L (ref 96–106)
Creatinine, Ser: 0.68 mg/dL — ABNORMAL LOW (ref 0.76–1.27)
Glucose: 88 mg/dL (ref 70–99)
Potassium: 4.9 mmol/L (ref 3.5–5.2)
Sodium: 142 mmol/L (ref 134–144)
eGFR: 93 mL/min/{1.73_m2} (ref >59)

## 2022-05-13 LAB — MAGNESIUM: Magnesium: 2.1 mg/dL (ref 1.6–2.3)

## 2022-05-13 NOTE — Telephone Encounter (Signed)
Called pt. He states he is taking Carvedilol 12.5 mg once daily. Pt informed he is to take that twice daily. Lisinopril 20 mg once daily. Will get message to Dr. Gardiner Rhyme for review.

## 2022-05-13 NOTE — Telephone Encounter (Signed)
Pt c/o medication issue:  1. Name of Medication  Lisinopril and CarvedilolI  2. How are you currently taking this medication (dosage and times per day)?   3. Are you having a reaction (difficulty breathing--STAT)?   4. What is your medication issue? He saw Dr Gardiner Rhyme yesterday(05-12-22) and he was supposed to call back today fir the nurse- need to get his medicne correct l

## 2022-05-14 NOTE — Telephone Encounter (Signed)
Med list updated

## 2022-05-14 NOTE — Telephone Encounter (Signed)
Can we update medication list?

## 2022-05-30 NOTE — Progress Notes (Signed)
Remote ICD transmission.   

## 2022-06-10 DIAGNOSIS — Z85828 Personal history of other malignant neoplasm of skin: Secondary | ICD-10-CM | POA: Diagnosis not present

## 2022-06-10 DIAGNOSIS — L570 Actinic keratosis: Secondary | ICD-10-CM | POA: Diagnosis not present

## 2022-06-10 DIAGNOSIS — L821 Other seborrheic keratosis: Secondary | ICD-10-CM | POA: Diagnosis not present

## 2022-06-12 ENCOUNTER — Ambulatory Visit (HOSPITAL_COMMUNITY): Payer: Medicare Other | Attending: Internal Medicine

## 2022-06-12 DIAGNOSIS — I5042 Chronic combined systolic (congestive) and diastolic (congestive) heart failure: Secondary | ICD-10-CM | POA: Diagnosis not present

## 2022-06-12 LAB — ECHOCARDIOGRAM COMPLETE
Area-P 1/2: 2.55 cm2
P 1/2 time: 412 msec
S' Lateral: 3.7 cm

## 2022-06-19 ENCOUNTER — Ambulatory Visit: Payer: Medicare Other | Admitting: Pulmonary Disease

## 2022-06-20 DIAGNOSIS — E1169 Type 2 diabetes mellitus with other specified complication: Secondary | ICD-10-CM | POA: Diagnosis not present

## 2022-06-20 DIAGNOSIS — E782 Mixed hyperlipidemia: Secondary | ICD-10-CM | POA: Diagnosis not present

## 2022-06-24 DIAGNOSIS — E1159 Type 2 diabetes mellitus with other circulatory complications: Secondary | ICD-10-CM | POA: Diagnosis not present

## 2022-06-24 DIAGNOSIS — I110 Hypertensive heart disease with heart failure: Secondary | ICD-10-CM | POA: Diagnosis not present

## 2022-06-24 DIAGNOSIS — E1169 Type 2 diabetes mellitus with other specified complication: Secondary | ICD-10-CM | POA: Diagnosis not present

## 2022-06-24 DIAGNOSIS — I480 Paroxysmal atrial fibrillation: Secondary | ICD-10-CM | POA: Diagnosis not present

## 2022-06-24 DIAGNOSIS — Z0000 Encounter for general adult medical examination without abnormal findings: Secondary | ICD-10-CM | POA: Diagnosis not present

## 2022-06-24 DIAGNOSIS — R911 Solitary pulmonary nodule: Secondary | ICD-10-CM | POA: Diagnosis not present

## 2022-06-24 DIAGNOSIS — E875 Hyperkalemia: Secondary | ICD-10-CM | POA: Diagnosis not present

## 2022-06-24 DIAGNOSIS — E782 Mixed hyperlipidemia: Secondary | ICD-10-CM | POA: Diagnosis not present

## 2022-06-24 DIAGNOSIS — I5042 Chronic combined systolic (congestive) and diastolic (congestive) heart failure: Secondary | ICD-10-CM | POA: Diagnosis not present

## 2022-06-24 DIAGNOSIS — I420 Dilated cardiomyopathy: Secondary | ICD-10-CM | POA: Diagnosis not present

## 2022-06-24 DIAGNOSIS — E559 Vitamin D deficiency, unspecified: Secondary | ICD-10-CM | POA: Diagnosis not present

## 2022-06-24 DIAGNOSIS — I441 Atrioventricular block, second degree: Secondary | ICD-10-CM | POA: Diagnosis not present

## 2022-07-03 ENCOUNTER — Ambulatory Visit (INDEPENDENT_AMBULATORY_CARE_PROVIDER_SITE_OTHER): Payer: Medicare Other | Admitting: Pulmonary Disease

## 2022-07-03 ENCOUNTER — Encounter: Payer: Self-pay | Admitting: Pulmonary Disease

## 2022-07-03 VITALS — BP 111/66 | HR 70 | Ht 69.0 in | Wt 126.0 lb

## 2022-07-03 DIAGNOSIS — R918 Other nonspecific abnormal finding of lung field: Secondary | ICD-10-CM

## 2022-07-03 DIAGNOSIS — J432 Centrilobular emphysema: Secondary | ICD-10-CM | POA: Diagnosis not present

## 2022-07-03 NOTE — Patient Instructions (Signed)
X CT chest wo con to follow on nodules

## 2022-07-03 NOTE — Progress Notes (Signed)
   Subjective:    Patient ID: Richard Martin, male    DOB: [birth year], 82 y.o.   MRN: [id number]  HPI  82 yo heavy ex-smoker for FU of copd & pulmonary nodule   PMH -  nonischemic cardiomyopathy status post CRT-D on 05/2019, he developed syncope and 2: 1 block, has baseline left bundle branch block.  Left heart catheterization showed no significant CAD.  He underwent cardiac MRI 02/2019 >> 'Transmural basal inferior LGE and basal inferoseptal midwall LGE. 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 -Paroxysmal atrial fibrillation on Eliquis   He smoked a pack per day for more than 50 years before he quit in 2002  Meds -developed rash and scalp itching with Spiriva   Chief Complaint  Patient presents with   Follow-up    Pt f/u sates that he is feeling increased fatigue and sluggish - he reports that he is not using the Sprivia because of a possible reaction    Annual follow-up visit. Breathing is doing okay although he does complain of some fatigue. He had COVID in [place] in November but has recovered well. He reports exposure to asbestos in the past.    Significant tests/ events reviewed   PFTs 09/2019 minimal airway obstruction, ratio 69, FEV1 83%, FVC 86%, TLC 93%, DLCO 15.1/63%   CT chest 01/2021 stable nodules   PET CT cardiac 03/2019 >>  Scattered bilateral pulmonary nodules, largest in the right lower lobe measuring up to 1.1 cm with minimal metabolic activity,  which may represent atelectasis   Nodule #1: Right upper lobe nodule measuring 6 mm   Nodule #2: Left upper lobe pulmonary nodule measuring 5 mm  Nodule #3: Right lower lobe subpleural nodule measuring 1.1 cm      CT chest 07/2019 severe emphysema, unchanged right lower lobe nodule, felt to favor postinfectious scarring  Review of Systems neg for any significant sore throat,  dysphagia, itching, sneezing, nasal congestion or excess/ purulent secretions, fever, chills, sweats, unintended wt loss, pleuritic or exertional cp, hempoptysis, orthopnea pnd or change in chronic leg swelling. Also denies presyncope, palpitations, heartburn, abdominal pain, nausea, vomiting, diarrhea or change in bowel or urinary habits, dysuria,hematuria, rash, arthralgias, visual complaints, headache, numbness weakness or ataxia.     Objective:   Physical Exam  Gen. Pleasant, well-nourished, in no distress ENT - no thrush, no pallor/icterus,no post nasal drip Neck: No JVD, no thyromegaly, no carotid bruits Lungs: no use of accessory muscles, no dullness to percussion, clear without rales or rhonchi  Cardiovascular: Rhythm regular, heart sounds  normal, no murmurs or gallops, no peripheral edema Musculoskeletal: No deformities, no cyanosis or clubbing        Assessment & Plan:

## 2022-07-03 NOTE — Assessment & Plan Note (Signed)
Nodules were stable for 2 years, last seen 2022. 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

## 2022-07-03 NOTE — Assessment & Plan Note (Signed)
Minimal airway obstruction.  He had a reaction to Spiriva and we have kept him off bronchodilator and he has done well without exacerbations. Continue to use albuterol on as-needed basis

## 2022-07-03 NOTE — Addendum Note (Signed)
Addended by: Jaynee Eagles on: 07/03/2022 11:30 AM   Modules accepted: Orders

## 2022-08-01 ENCOUNTER — Ambulatory Visit (INDEPENDENT_AMBULATORY_CARE_PROVIDER_SITE_OTHER): Payer: Medicare Other

## 2022-08-01 DIAGNOSIS — I441 Atrioventricular block, second degree: Secondary | ICD-10-CM | POA: Diagnosis not present

## 2022-08-01 LAB — CUP PACEART REMOTE DEVICE CHECK
Battery Remaining Longevity: 56 mo
Battery Remaining Percentage: 60 %
Battery Voltage: 2.95 V
Brady Statistic AP VP Percent: 9.7 %
Brady Statistic AP VS Percent: 1 %
Brady Statistic AS VP Percent: 90 %
Brady Statistic AS VS Percent: 1 %
Brady Statistic RA Percent Paced: 9.5 %
Date Time Interrogation Session: 20240531030628
HighPow Impedance: 71 Ohm
Implantable Lead Connection Status: 753985
Implantable Lead Connection Status: 753985
Implantable Lead Connection Status: 753985
Implantable Lead Implant Date: 20210305
Implantable Lead Implant Date: 20210305
Implantable Lead Implant Date: 20210305
Implantable Lead Location: 753858
Implantable Lead Location: 753859
Implantable Lead Location: 753860
Implantable Pulse Generator Implant Date: 20210305
Lead Channel Impedance Value: 1025 Ohm
Lead Channel Impedance Value: 460 Ohm
Lead Channel Impedance Value: 480 Ohm
Lead Channel Pacing Threshold Amplitude: 0.75 V
Lead Channel Pacing Threshold Amplitude: 1.125 V
Lead Channel Pacing Threshold Amplitude: 2 V
Lead Channel Pacing Threshold Pulse Width: 0.5 ms
Lead Channel Pacing Threshold Pulse Width: 0.5 ms
Lead Channel Pacing Threshold Pulse Width: 0.8 ms
Lead Channel Sensing Intrinsic Amplitude: 12 mV
Lead Channel Sensing Intrinsic Amplitude: 3.8 mV
Lead Channel Setting Pacing Amplitude: 1.75 V
Lead Channel Setting Pacing Amplitude: 2.125
Lead Channel Setting Pacing Amplitude: 2.5 V
Lead Channel Setting Pacing Pulse Width: 0.5 ms
Lead Channel Setting Pacing Pulse Width: 0.8 ms
Lead Channel Setting Sensing Sensitivity: 0.5 mV
Pulse Gen Serial Number: 111018177
Zone Setting Status: 755011

## 2022-08-15 ENCOUNTER — Ambulatory Visit (HOSPITAL_COMMUNITY)
Admission: RE | Admit: 2022-08-15 | Discharge: 2022-08-15 | Disposition: A | Discharge status: Home / Self Care | Payer: Medicare Other | Source: Ambulatory Visit | Attending: Pulmonary Disease | Admitting: Pulmonary Disease

## 2022-08-15 DIAGNOSIS — R918 Other nonspecific abnormal finding of lung field: Secondary | ICD-10-CM | POA: Insufficient documentation

## 2022-08-15 DIAGNOSIS — J439 Emphysema, unspecified: Secondary | ICD-10-CM | POA: Diagnosis not present

## 2022-08-19 NOTE — Progress Notes (Signed)
Remote ICD transmission.   

## 2022-09-14 ENCOUNTER — Other Ambulatory Visit: Payer: Self-pay

## 2022-09-14 ENCOUNTER — Inpatient Hospital Stay (HOSPITAL_COMMUNITY)
Admission: EM | Admit: 2022-09-14 | Discharge: 2022-09-16 | DRG: 866 | Disposition: A | Discharge status: Home / Self Care | Payer: Medicare Other | Attending: Family Medicine | Admitting: Family Medicine

## 2022-09-14 ENCOUNTER — Emergency Department (HOSPITAL_COMMUNITY): Payer: Medicare Other

## 2022-09-14 DIAGNOSIS — Z681 Body mass index (BMI) 19 or less, adult: Secondary | ICD-10-CM

## 2022-09-14 DIAGNOSIS — E1165 Type 2 diabetes mellitus with hyperglycemia: Secondary | ICD-10-CM | POA: Diagnosis present

## 2022-09-14 DIAGNOSIS — E118 Type 2 diabetes mellitus with unspecified complications: Secondary | ICD-10-CM

## 2022-09-14 DIAGNOSIS — E871 Hypo-osmolality and hyponatremia: Secondary | ICD-10-CM | POA: Diagnosis not present

## 2022-09-14 DIAGNOSIS — R509 Fever, unspecified: Secondary | ICD-10-CM | POA: Diagnosis present

## 2022-09-14 DIAGNOSIS — R531 Weakness: Secondary | ICD-10-CM | POA: Diagnosis present

## 2022-09-14 DIAGNOSIS — R6510 Systemic inflammatory response syndrome (SIRS) of non-infectious origin without acute organ dysfunction: Secondary | ICD-10-CM | POA: Diagnosis present

## 2022-09-14 DIAGNOSIS — B349 Viral infection, unspecified: Secondary | ICD-10-CM | POA: Diagnosis present

## 2022-09-14 DIAGNOSIS — I5022 Chronic systolic (congestive) heart failure: Secondary | ICD-10-CM | POA: Diagnosis not present

## 2022-09-14 DIAGNOSIS — I428 Other cardiomyopathies: Secondary | ICD-10-CM | POA: Diagnosis not present

## 2022-09-14 DIAGNOSIS — I871 Compression of vein: Secondary | ICD-10-CM | POA: Diagnosis not present

## 2022-09-14 DIAGNOSIS — Z79899 Other long term (current) drug therapy: Secondary | ICD-10-CM

## 2022-09-14 DIAGNOSIS — I700 Atherosclerosis of aorta: Secondary | ICD-10-CM | POA: Diagnosis not present

## 2022-09-14 DIAGNOSIS — E785 Hyperlipidemia, unspecified: Secondary | ICD-10-CM | POA: Diagnosis present

## 2022-09-14 DIAGNOSIS — I447 Left bundle-branch block, unspecified: Secondary | ICD-10-CM | POA: Diagnosis present

## 2022-09-14 DIAGNOSIS — J4489 Other specified chronic obstructive pulmonary disease: Secondary | ICD-10-CM | POA: Diagnosis not present

## 2022-09-14 DIAGNOSIS — Z7901 Long term (current) use of anticoagulants: Secondary | ICD-10-CM | POA: Diagnosis not present

## 2022-09-14 DIAGNOSIS — D61818 Other pancytopenia: Secondary | ICD-10-CM | POA: Diagnosis not present

## 2022-09-14 DIAGNOSIS — R001 Bradycardia, unspecified: Secondary | ICD-10-CM | POA: Diagnosis present

## 2022-09-14 DIAGNOSIS — Z8249 Family history of ischemic heart disease and other diseases of the circulatory system: Secondary | ICD-10-CM

## 2022-09-14 DIAGNOSIS — R636 Underweight: Secondary | ICD-10-CM | POA: Diagnosis present

## 2022-09-14 DIAGNOSIS — I5042 Chronic combined systolic (congestive) and diastolic (congestive) heart failure: Secondary | ICD-10-CM | POA: Diagnosis present

## 2022-09-14 DIAGNOSIS — A419 Sepsis, unspecified organism: Secondary | ICD-10-CM | POA: Diagnosis not present

## 2022-09-14 DIAGNOSIS — R599 Enlarged lymph nodes, unspecified: Secondary | ICD-10-CM | POA: Diagnosis not present

## 2022-09-14 DIAGNOSIS — M160 Bilateral primary osteoarthritis of hip: Secondary | ICD-10-CM | POA: Diagnosis present

## 2022-09-14 DIAGNOSIS — R7401 Elevation of levels of liver transaminase levels: Secondary | ICD-10-CM | POA: Diagnosis present

## 2022-09-14 DIAGNOSIS — N400 Enlarged prostate without lower urinary tract symptoms: Secondary | ICD-10-CM | POA: Diagnosis present

## 2022-09-14 DIAGNOSIS — Z1152 Encounter for screening for COVID-19: Secondary | ICD-10-CM

## 2022-09-14 DIAGNOSIS — R7989 Other specified abnormal findings of blood chemistry: Secondary | ICD-10-CM

## 2022-09-14 DIAGNOSIS — I480 Paroxysmal atrial fibrillation: Secondary | ICD-10-CM | POA: Diagnosis present

## 2022-09-14 DIAGNOSIS — I110 Hypertensive heart disease with heart failure: Secondary | ICD-10-CM | POA: Diagnosis present

## 2022-09-14 DIAGNOSIS — Z7984 Long term (current) use of oral hypoglycemic drugs: Secondary | ICD-10-CM

## 2022-09-14 DIAGNOSIS — Z91013 Allergy to seafood: Secondary | ICD-10-CM

## 2022-09-14 DIAGNOSIS — K5900 Constipation, unspecified: Secondary | ICD-10-CM | POA: Diagnosis present

## 2022-09-14 DIAGNOSIS — Z87891 Personal history of nicotine dependence: Secondary | ICD-10-CM

## 2022-09-14 DIAGNOSIS — D696 Thrombocytopenia, unspecified: Secondary | ICD-10-CM | POA: Diagnosis not present

## 2022-09-14 DIAGNOSIS — Z95810 Presence of automatic (implantable) cardiac defibrillator: Secondary | ICD-10-CM

## 2022-09-14 LAB — COMPREHENSIVE METABOLIC PANEL
ALT: 77 U/L — ABNORMAL HIGH (ref 0–44)
AST: 100 U/L — ABNORMAL HIGH (ref 15–41)
Albumin: 3.9 g/dL (ref 3.5–5.0)
Alkaline Phosphatase: 57 U/L (ref 38–126)
Anion gap: 13 (ref 5–15)
BUN: 23 mg/dL (ref 8–23)
CO2: 22 mmol/L (ref 22–32)
Calcium: 9.2 mg/dL (ref 8.9–10.3)
Chloride: 98 mmol/L (ref 98–111)
Creatinine, Ser: 0.79 mg/dL (ref 0.61–1.24)
GFR, Estimated: >60 mL/min (ref >60)
Glucose, Bld: 157 mg/dL — ABNORMAL HIGH (ref 70–99)
Potassium: 4.2 mmol/L (ref 3.5–5.1)
Sodium: 133 mmol/L — ABNORMAL LOW (ref 135–145)
Total Bilirubin: 0.7 mg/dL (ref 0.3–1.2)
Total Protein: 7.5 g/dL (ref 6.5–8.1)

## 2022-09-14 LAB — CBC WITH DIFFERENTIAL/PLATELET
Abs Immature Granulocytes: 0.02 10*3/uL (ref 0.00–0.07)
Basophils Absolute: 0 10*3/uL (ref 0.0–0.1)
Basophils Relative: 0 %
Eosinophils Absolute: 0 10*3/uL (ref 0.0–0.5)
Eosinophils Relative: 0 %
HCT: 35.1 % — ABNORMAL LOW (ref 39.0–52.0)
Hemoglobin: 11.9 g/dL — ABNORMAL LOW (ref 13.0–17.0)
Immature Granulocytes: 0 %
Lymphocytes Relative: 4 %
Lymphs Abs: 0.2 10*3/uL — ABNORMAL LOW (ref 0.7–4.0)
MCH: 30.6 pg (ref 26.0–34.0)
MCHC: 33.9 g/dL (ref 30.0–36.0)
MCV: 90.2 fL (ref 80.0–100.0)
Monocytes Absolute: 0.4 10*3/uL (ref 0.1–1.0)
Monocytes Relative: 9 %
Neutro Abs: 4.2 10*3/uL (ref 1.7–7.7)
Neutrophils Relative %: 87 %
Platelets: 133 10*3/uL — ABNORMAL LOW (ref 150–400)
RBC: 3.89 MIL/uL — ABNORMAL LOW (ref 4.22–5.81)
RDW: 13.1 % (ref 11.5–15.5)
WBC: 4.8 10*3/uL (ref 4.0–10.5)
nRBC: 0 % (ref 0.0–0.2)

## 2022-09-14 LAB — GROUP A STREP BY PCR: Group A Strep by PCR: NOT DETECTED

## 2022-09-14 LAB — URINALYSIS, W/ REFLEX TO CULTURE (INFECTION SUSPECTED)
Bilirubin Urine: NEGATIVE
Glucose, UA: >=500 mg/dL — AB
Hgb urine dipstick: NEGATIVE
Ketones, ur: 5 mg/dL — AB
Leukocytes,Ua: NEGATIVE
Nitrite: NEGATIVE
Protein, ur: NEGATIVE mg/dL
Specific Gravity, Urine: >1.046 — ABNORMAL HIGH (ref 1.005–1.030)
pH: 5 (ref 5.0–8.0)

## 2022-09-14 LAB — LACTIC ACID, PLASMA
Lactic Acid, Venous: 1.6 mmol/L (ref 0.5–1.9)
Lactic Acid, Venous: 1.8 mmol/L (ref 0.5–1.9)

## 2022-09-14 LAB — CBG MONITORING, ED: Glucose-Capillary: 115 mg/dL — ABNORMAL HIGH (ref 70–99)

## 2022-09-14 LAB — PROTIME-INR
INR: 1.3 — ABNORMAL HIGH (ref 0.8–1.2)
Prothrombin Time: 16.2 seconds — ABNORMAL HIGH (ref 11.4–15.2)

## 2022-09-14 LAB — APTT: aPTT: 26 seconds (ref 24–36)

## 2022-09-14 LAB — SARS CORONAVIRUS 2 BY RT PCR: SARS Coronavirus 2 by RT PCR: NEGATIVE

## 2022-09-14 MED ORDER — IOHEXOL 300 MG/ML  SOLN
100.0000 mL | Freq: Once | INTRAMUSCULAR | Status: AC | PRN
  Administered 2022-09-14: 100 mL via INTRAVENOUS

## 2022-09-14 MED ORDER — LACTATED RINGERS IV BOLUS
500.0000 mL | Freq: Once | INTRAVENOUS | Status: AC
  Administered 2022-09-14: 500 mL via INTRAVENOUS

## 2022-09-14 MED ORDER — SODIUM CHLORIDE 0.9 % IV SOLN
1.0000 g | Freq: Once | INTRAVENOUS | Status: AC
  Administered 2022-09-15: 1 g via INTRAVENOUS
  Filled 2022-09-14: qty 10

## 2022-09-14 MED ORDER — LACTATED RINGERS IV BOLUS (SEPSIS): 1000.0000 mL | Freq: Once | INTRAVENOUS | Status: DC

## 2022-09-14 MED ORDER — METRONIDAZOLE 500 MG/100ML IV SOLN
500.0000 mg | Freq: Once | INTRAVENOUS | Status: AC
  Administered 2022-09-15: 500 mg via INTRAVENOUS
  Filled 2022-09-14: qty 100

## 2022-09-14 MED ORDER — ACETAMINOPHEN 325 MG PO TABS
650.0000 mg | ORAL_TABLET | Freq: Once | ORAL | Status: AC
  Administered 2022-09-14: 650 mg via ORAL
  Filled 2022-09-14: qty 2

## 2022-09-14 NOTE — ED Provider Notes (Signed)
[facility] AT [facility] Provider Note   CSN: [id number] Arrival date & time: 09/14/22  1517     History  Chief Complaint  Patient presents with   Altered Mental Status    Richard Martin is a 82 y.o. male.  He is brought in by his wife for fever and weakness.  She said he started not feeling well last evening.  Nothing specific.  Today he slept late and was very weak and needed help walking.  Found to have a fever of 102.  He denies any headache, cough, chest pain, shortness of breath, abdominal pain vomiting diarrhea or urinary symptoms.  He denies any rashes.  [place] but does not recall specific tick bite.  He had a sore throat a few days ago but that is resolved.  No sick contacts or recent travel.  The history is provided by the patient and the spouse.  Fever Max temp prior to arrival:  102 Temp source:  Oral Onset quality:  Gradual Duration:  1 day Timing:  Constant Progression:  Unchanged Chronicity:  New Relieved by:  None tried Worsened by:  Nothing Ineffective treatments:  None tried Associated symptoms: confusion and sore throat   Associated symptoms: no chest pain, no chills, no cough, no diarrhea, no dysuria, no headaches, no nausea, no rash and no vomiting   Risk factors: no recent travel and no sick contacts        Home Medications Prior to Admission medications   Medication Sig Start Date End Date Taking? Authorizing Provider  acetaminophen (TYLENOL) 500 MG tablet Take 500 mg by mouth daily as needed for moderate pain or headache.    [provider]  apixaban (ELIQUIS) 2.5 MG TABS tablet TAKE (1) TABLET TWICE DAILY. 04/23/22   Little Ishikawa, MD  carvedilol (COREG) 12.5 MG tablet TAKE (1) TABLET BY MOUTH TWICE DAILY WITH MEALS. 04/09/22   Dyann Kief, PA-C  cholecalciferol (VITAMIN D3) 25 MCG (1000 UT) tablet Take 1,000 Units by mouth daily.    [provider]  FARXIGA 10 MG TABS tablet Take 10 mg by mouth daily. Take 1 tablet by mouth every day 03/06/20   [provider]  lisinopril (ZESTRIL) 20 MG tablet TAKE (1) TABLET BY MOUTH ONCE DAILY. 11/21/21   Little Ishikawa, MD  metFORMIN (GLUCOPHAGE) 500 MG tablet Take 1 tablet (500 mg total) by mouth 2 (two) times daily. 01/16/19   [place], Peter M, MD  rosuvastatin (CRESTOR) 10 MG tablet Take 10 mg by mouth daily.  08/17/12   [provider]  spironolactone (ALDACTONE) 25 MG tablet TAKE 1/2 TABLET BY MOUTH DAILY. 10/28/21   Little Ishikawa, MD  zinc gluconate 50 MG tablet Take 50 mg by mouth daily.    [provider]      Allergies    [name] allergy]    Review of Systems   Review of Systems  Constitutional:  Positive for fever. Negative for chills.  HENT:  Positive for sore throat.   Respiratory:  Negative for cough.   Cardiovascular:  Negative for chest pain.  Gastrointestinal:  Negative for diarrhea, nausea and vomiting.  Genitourinary:  Negative for dysuria.  Skin:  Negative for rash.  Neurological:  Negative for headaches.  Psychiatric/Behavioral:  Positive for confusion.     Physical Exam Updated Vital Signs BP (!) 116/55   Pulse 82  Temp (!) 102.2 F (39 C) (Oral)   Resp (!) 23   Ht 5\' 10"  (1.778 m)   Wt 59.9 kg   SpO2 (!) 87%   BMI 18.94 kg/m  Physical Exam Vitals and nursing note reviewed.  Constitutional:      General: He is not in acute distress.    Appearance: Normal appearance. He is well-developed.  HENT:     Head: Normocephalic and atraumatic.  Eyes:     Conjunctiva/sclera: Conjunctivae normal.  Cardiovascular:     Rate and Rhythm: Normal rate and regular rhythm.     Heart sounds: No murmur heard.    Comments: Pacemaker right upper chest Pulmonary:     Effort: Pulmonary effort is normal. No respiratory distress.     Breath sounds: Normal breath sounds.  Abdominal:     Palpations: Abdomen is soft.      Tenderness: There is no abdominal tenderness. There is no guarding or rebound.  Musculoskeletal:        General: No deformity. Normal range of motion.     Cervical back: Neck supple.  Skin:    General: Skin is warm and dry.     Capillary Refill: Capillary refill takes less than 2 seconds.  Neurological:     General: No focal deficit present.     Mental Status: He is alert. He is disoriented.     Sensory: No sensory deficit.     Motor: No weakness.     ED Results / Procedures / Treatments   Labs (all labs ordered are listed, but only abnormal results are displayed) Labs Reviewed  COMPREHENSIVE METABOLIC PANEL - Abnormal; Notable for the following components:      Result Value   Sodium 133 (*)    Glucose, Bld 157 (*)    AST 100 (*)    ALT 77 (*)    All other components within normal limits  CBC WITH DIFFERENTIAL/PLATELET - Abnormal; Notable for the following components:   RBC 3.89 (*)    Hemoglobin 11.9 (*)    HCT 35.1 (*)    Platelets 133 (*)    Lymphs Abs 0.2 (*)    All other components within normal limits  PROTIME-INR - Abnormal; Notable for the following components:   Prothrombin Time 16.2 (*)    INR 1.3 (*)    All other components within normal limits  URINALYSIS, W/ REFLEX TO CULTURE (INFECTION SUSPECTED) - Abnormal; Notable for the following components:   Specific Gravity, Urine >1.046 (*)    Glucose, UA >=500 (*)    Ketones, ur 5 (*)    Bacteria, UA RARE (*)    All other components within normal limits  COMPREHENSIVE METABOLIC PANEL - Abnormal; Notable for the following components:   Sodium 132 (*)    Chloride 97 (*)    Glucose, Bld 257 (*)    BUN 26 (*)    Calcium 8.4 (*)    Total Protein 6.1 (*)    Albumin 3.1 (*)    AST 75 (*)    ALT 76 (*)    All other components within normal limits  CBC - Abnormal; Notable for the following components:   WBC 3.6 (*)    RBC 3.45 (*)    Hemoglobin 10.7 (*)    HCT 32.1 (*)    Platelets 120 (*)    All other  components within normal limits  GLUCOSE, CAPILLARY - Abnormal; Notable for the following components:   Glucose-Capillary 276 (*)  All other components within normal limits  GLUCOSE, CAPILLARY - Abnormal; Notable for the following components:   Glucose-Capillary 138 (*)    All other components within normal limits  CBG MONITORING, ED - Abnormal; Notable for the following components:   Glucose-Capillary 115 (*)    All other components within normal limits  CULTURE, BLOOD (ROUTINE X 2)  CULTURE, BLOOD (ROUTINE X 2)  SARS CORONAVIRUS 2 BY RT PCR  GROUP A STREP BY PCR  RESPIRATORY PANEL BY PCR  LACTIC ACID, PLASMA  LACTIC ACID, PLASMA  APTT  MAGNESIUM  PHOSPHORUS  PROCALCITONIN  HEMOGLOBIN A1C    EKG EKG Interpretation Date/Time:  Sunday September 14 2022 15:35:41 EDT Ventricular Rate:  83 PR Interval:  207 QRS Duration:  131 QT Interval:  378 QTC Calculation: 445 R Axis:   114  Text Interpretation: v paced rhythm Confirmed by Meridee Score [phone]) on 09/14/2022 3:50:33 PM  Radiology CT CHEST ABDOMEN PELVIS W CONTRAST  Result Date: 09/14/2022 CLINICAL DATA:  Sepsis and fever EXAM: CT CHEST, ABDOMEN, AND PELVIS WITH CONTRAST TECHNIQUE: Multidetector CT imaging of the chest, abdomen and pelvis was performed following the standard protocol during bolus administration of intravenous contrast. RADIATION DOSE REDUCTION: This exam was performed according to the departmental dose-optimization program which includes automated exposure control, adjustment of the mA and/or kV according to patient size and/or use of iterative reconstruction technique. CONTRAST:  OMNIPAQUE IOHEXOL 300 MG/ML  SOLN COMPARISON:  Chest CT 08/15/2022 FINDINGS: CT CHEST FINDINGS Cardiovascular: Coronary, aortic arch, and branch vessel atherosclerotic vascular disease. AICD noted. Collateral venous flow from left upper extremity injection raising suspicion for brachiocephalic vein stenosis. Mediastinum/Nodes: Left  infrahilar lymph node mildly enlarged 1.1 cm short axis on image 35 series 10. Lungs/Pleura: Centrilobular emphysema. Biapical pleuroparenchymal scarring. Mild dependent atelectasis in both lower lobes. Small right lung nodules on image 68, 102, and 134 of series 3 are stable from 07/06/2019 and considered benign. The largest of these is the 8 by 5 mm right lower lobe nodule on image 135 series 3. No further imaging workup of these lesions is indicated. Musculoskeletal: Thoracic spondylosis. Probable sebaceous cyst or similar benign lesion in the right anterior chest on image 36 series 10, stable. CT ABDOMEN PELVIS FINDINGS Hepatobiliary: Numerous hepatic cysts are redemonstrated. No further imaging workup of these lesions is indicated. Gallbladder unremarkable.  No biliary dilatation. Pancreas: Unremarkable Spleen: Punctate calcifications compatible with old granulomatous disease. Adrenals/Urinary Tract: Unremarkable Stomach/Bowel: Prominent stool throughout the colon favors constipation. Normal appendix. Vascular/Lymphatic: Atherosclerosis is present, including aortoiliac atherosclerotic disease. Reproductive: Prostatomegaly. Other: No supplemental non-categorized findings. Musculoskeletal: Degenerative arthropathy of both hips. Minimal grade 1 degenerative retrolisthesis at L2-3 and L3-4. Conjunction with lumbar spondylosis and degenerative disc disease there is suspected right foraminal impingement at the L4-5 level. IMPRESSION: 1. No specific cause for sepsis is identified. 2. Prominent stool throughout the colon favors constipation. 3. Substantial Prostatomegaly. 4. Degenerative arthropathy of both hips. 5. Suspected right foraminal impingement at L4-5. 6. Collateral venous flow from left upper extremity injection raising suspicion for brachiocephalic vein stenosis. 7. Mildly enlarged left infrahilar lymph node, nonspecific. Aortic Atherosclerosis (ICD10-I70.0) and Emphysema (ICD10-J43.9). Electronically  Signed   By: Gaylyn Rong M.D.   On: 09/14/2022 18:20   DG Chest Port 1 View  Result Date: 09/14/2022 CLINICAL DATA:  Questionable sepsis-evaluate for abnormality EXAM: PORTABLE CHEST 1 VIEW COMPARISON:  05/07/2019 radiograph and 08/15/2022 CT chest FINDINGS: Stable cardiomediastinal silhouette. Aortic atherosclerotic calcification. Left chest wall CRT-D. Chronic bronchitic changes.  Bullous change in the right upper lobe. No focal consolidation, pleural effusion, or pneumothorax. No displaced rib fractures. IMPRESSION: No active disease.  Emphysema. Electronically Signed   By: Minerva Fester M.D.   On: 09/14/2022 17:02    Procedures Procedures    Medications Ordered in ED Medications  acetaminophen (TYLENOL) tablet 650 mg (650 mg Oral Given 09/15/22 0158)    Or  acetaminophen (TYLENOL) suppository 650 mg ( Rectal See Alternative 09/15/22 0158)  ondansetron (ZOFRAN) tablet 4 mg (has no administration in time range)    Or  ondansetron (ZOFRAN) injection 4 mg (has no administration in time range)  feeding supplement (GLUCERNA SHAKE) (GLUCERNA SHAKE) liquid 237 mL (has no administration in time range)  insulin aspart (novoLOG) injection 0-9 Units (has no administration in time range)  vancomycin (VANCOCIN) IVPB 1000 mg/200 mL premix (has no administration in time range)  piperacillin-tazobactam (ZOSYN) IVPB 3.375 g ( Intravenous Infusion Verify 09/15/22 0626)  0.9 %  sodium chloride infusion ( Intravenous Infusion Verify 09/15/22 0626)  acetaminophen (TYLENOL) tablet 650 mg (650 mg Oral Given 09/14/22 1628)  lactated ringers bolus 500 mL (0 mLs Intravenous Stopped 09/14/22 1751)  iohexol (OMNIPAQUE) 300 MG/ML solution 100 mL (100 mLs Intravenous Contrast Given 09/14/22 1703)  lactated ringers bolus 500 mL (0 mLs Intravenous Stopped 09/14/22 2051)  cefTRIAXone (ROCEPHIN) 1 g in sodium chloride 0.9 % 100 mL IVPB (0 g Intravenous Stopped 09/15/22 0032)  metroNIDAZOLE (FLAGYL) IVPB 500 mg (500 mg  Intravenous New Bag/Given 09/15/22 0002)  vancomycin (VANCOCIN) IVPB 1000 mg/200 mL premix (0 mg Intravenous Stopped 09/15/22 0355)  sodium chloride 0.9 % bolus 250 mL ( Intravenous Rate/Dose Change 09/15/22 0546)    ED Course/ Medical Decision Making/ A&P Clinical Course as of 09/15/22 0813  Sun Sep 14, 2022  1546 Last cardiac echo showed EF of 50 to 55% [MB]  1636 Chest x-ray interpreted by me as no definite infiltrate, AICD in place no pneumothorax.  Awaiting radiology reading. [MB]  2252 Discussed with [facility] hospitalist Dr. Thomes Dinning.  He is asking that we start some broad-spectrum antibiotics and he will see in consult for admission.  Patient in agreement with plan for admission. [MB]    Clinical Course User Index [MB] Terrilee Files, MD                             Medical Decision Making Amount and/or Complexity of Data Reviewed Labs: ordered. Radiology: ordered.  Risk OTC drugs. Prescription drug management. Decision regarding hospitalization.   This patient complains of fever malaise blood pressure; this involves an extensive number of treatment Options and is a complaint that carries with it a high risk of complications and morbidity. The differential includes sepsis, Sirs, dehydration, metabolic derangement  I ordered, reviewed and interpreted labs, which included CBC with normal white count stable hemoglobin, platelets mildly low, chemistries with mildly elevated glucose elevation of AST ALT urinalysis unremarkable, COVID-negative strep negative blood culture sent, lactate normal I ordered medication IV fluids IV antibiotics and reviewed PMP when indicated. I ordered imaging studies which included chest x-ray, CT chest abdomen and pelvis and I independently    visualized and interpreted imaging which showed no definite findings to explain patient's fever Additional history obtained from patient's wife Previous records obtained and reviewed in epic including prior  cardiology notes I consulted [facility] hospitalist Dr. Thomes Dinning and discussed lab and imaging findings and discussed disposition.  Cardiac monitoring reviewed, sinus bradycardia  Social determinants considered, no significant barriers Critical Interventions: None  After the interventions stated above, I reevaluated the patient and found patient to be very asymptomatic appearing Admission and further testing considered, patient would benefit from mission the hospital for further workup of his symptoms.  Will cover empirically with antibiotics.  Unclear source.  Patient in agreement with plan for admission.         Final Clinical Impression(s) / ED Diagnoses Final diagnoses:  Febrile illness, acute  Thrombocytopenia (HCC)  Elevated LFTs  General weakness    Rx / DC Orders ED Discharge Orders     None         Terrilee Files, MD 09/15/22 [phone]

## 2022-09-14 NOTE — ED Notes (Signed)
Pt. O2 sat on RA ranging 88-89%. 2L [place] applied.

## 2022-09-14 NOTE — ED Triage Notes (Signed)
Pt. BIB [facility]. Pt. Last known well was last night around 2200. Woke up around 1300 today, very weak and AMS. Temp with EMS was 102 tympanic. Also 102 oral here in ED. CBG with EMS 201. Pt. Is alert and oriented to self and place.

## 2022-09-14 NOTE — ED Notes (Signed)
Provider at bedside

## 2022-09-14 NOTE — H&P (Signed)
History and Physical    Patient: Richard Martin ZOX:[id number] DOB: September 17, 1940 DOA: 09/14/2022 DOS: the patient was seen and examined on 09/15/2022 PCP: Benita Stabile, MD  Patient coming from: Home  Chief Complaint:  Chief Complaint  Patient presents with   Altered Mental Status   HPI: Richard Martin is a 82 y.o. male with medical history significant of hypertension, hyperlipidemia, type 2 diabetes mellitus, chronic combined systolic and diastolic CHF who presents to the emergency department after being brought by wife.  Patient started to feel sick a few days ago and this worsened last evening.  Today, patient slept late, he was weak and required assistance for ambulation (different from baseline, ambulates without assistance, drives car), on checking his temperature, it was noted to be 1059F.  Patient [place] bites all the time but was not aware of any tick bite.  He denies nausea, vomiting, headache, rashes, chest pain, shortness of breath, abdominal pain.  Patient denies any recent travel or sick contacts.  ED Course:  In the emergency department, he was febrile with a temperature of 102.59F, tachypneic with RR of 23/min, HR 77 bpm, BP 97/53, O2 sat 90% on room air.  Workup in the ED showed normocytic anemia, thrombocytopenia, AST 100, ALT 77, lactic acid was normal, urinalysis was normal.  SARS coronavirus 2 was negative, group A strep Streptococcus was negative.  Blood culture pending. CT chest, abdomen and pelvis with contrast showed 1. No specific cause for sepsis is identified. 2. Prominent stool throughout the colon favors constipation. 3. Substantial Prostatomegaly. 4. Degenerative arthropathy of both hips. 5. Suspected right foraminal impingement at L4-5. 6. Collateral venous flow from left upper extremity injection raising suspicion for brachiocephalic vein stenosis. 7. Mildly enlarged left infrahilar lymph node, nonspecific. Chest x-ray  showed no active disease Patient was empirically treated with IV ceftriaxone and metronidazole, Tylenol was given, IV hydration was provided.  Hospitalist was asked to admit patient for further evaluation and management.  Review of Systems: Review of systems as noted in the HPI. All other systems reviewed and are negative.   Past Medical History:  Diagnosis Date   Cardiac resynchronization therapy defibrillator (CRT-D) in place    a. 05/2019 s/p SJM [facility] HF CRT-D [id number] (ser# [id number]).   Chronic combined systolic (congestive) and diastolic (congestive) heart failure (HCC)    a. 01/2019 Echo: EF 40-45%, focal basal inf aneurysmal region. Gr2 DD. Nl RV fxn. Mildly dil RA. Mod TR. Triv PR; b. 02/2019 cMRI: EF 35%.   Diabetes mellitus without complication (HCC)    LBBB (left bundle branch block)    NICM (nonischemic cardiomyopathy) (HCC)    a. 01/2019 Echo: EF 40-45%; b. 01/2019 Cath: nl cors. EF 35-45%; c. 02/2019 cMRI: Basal inf and basal infsept midwall LGE. EF 35%; d. 05/2019 s/p SJM [facility] HF CRT-D [id number] (ser# [id number]).   Past Surgical History:  Procedure Laterality Date   BIV ICD INSERTION CRT-D N/A 05/06/2019   Procedure: BIV ICD INSERTION CRT-D;  Surgeon: Regan Lemming, MD;  Location: [facility] INVASIVE CV LAB;  Service: Cardiovascular;  Laterality: N/A;   LEFT HEART CATH AND CORONARY ANGIOGRAPHY N/A 01/13/2019   Procedure: LEFT HEART CATH AND CORONARY ANGIOGRAPHY;  Surgeon: Swazil[place], Peter M, MD;  Location: [facility] INVASIVE CV LAB;  Service: Cardiovascular;  Laterality: N/A;    Social History:  reports that he quit smoking about 22 years ago. His smoking use included cigarettes. He has never used smokeless  tobacco. He reports that he does not drink alcohol and does not use drugs.   Allergies  Allergen Reactions   Crab [Shellfish Allergy] Swelling    Family History  Problem Relation Age of Onset   Heart attack Father    Aneurysm Mother      Prior to Admission  medications   Medication Sig Start Date End Date Taking? Authorizing Provider  apixaban (ELIQUIS) 2.5 MG TABS tablet TAKE (1) TABLET TWICE DAILY. Patient taking differently: Take 2.5 mg by mouth 2 (two) times daily. 04/23/22  Yes Little Ishikawa, MD  carvedilol (COREG) 12.5 MG tablet TAKE (1) TABLET BY MOUTH TWICE DAILY WITH MEALS. Patient taking differently: Take 12.5 mg by mouth 2 (two) times daily with a meal. 04/09/22  Yes Dyann Kief, PA-C  cholecalciferol (VITAMIN D3) 25 MCG (1000 UT) tablet Take 1,000 Units by mouth daily.   Yes [provider]  FARXIGA 10 MG TABS tablet Take 10 mg by mouth daily. Take 1 tablet by mouth every day 03/06/20  Yes [provider]  lisinopril (ZESTRIL) 20 MG tablet TAKE (1) TABLET BY MOUTH ONCE DAILY. Patient taking differently: Take 20 mg by mouth daily. 11/21/21  Yes Little Ishikawa, MD  metFORMIN (GLUCOPHAGE) 500 MG tablet Take 1 tablet (500 mg total) by mouth 2 (two) times daily. 01/16/19  Yes [place], Peter M, MD  polyvinyl alcohol (LIQUIFILM TEARS) 1.4 % ophthalmic solution Apply to eye. 09/29/12  Yes [provider]  rosuvastatin (CRESTOR) 10 MG tablet Take 10 mg by mouth daily.  08/17/12  Yes [provider]  spironolactone (ALDACTONE) 25 MG tablet TAKE 1/2 TABLET BY MOUTH DAILY. 10/28/21  Yes Little Ishikawa, MD  zinc gluconate 50 MG tablet Take 50 mg by mouth daily.   Yes [provider]  acetaminophen (TYLENOL) 500 MG tablet Take 500 mg by mouth daily as needed for moderate pain or headache.    [provider]    Physical Exam: BP (!) 112/46 (BP Location: Left Arm)   Pulse 79   Temp (!) 102.6 F (39.2 C)   Resp 18   Ht 5\' 10"  (1.778 m)   Wt 56.9 kg   SpO2 92%   BMI 18.00 kg/m   General: 82 y.o. year-old male well developed well nourished in no acute distress.  Alert and oriented x3. HEENT: NCAT, EOMI Neck: Supple, trachea medial Cardiovascular: Pacemaker insertion  site noted on right upper chest area.  Regular rate and rhythm with no rubs or gallops.  No thyromegaly or JVD noted.  No lower extremity edema. 2/4 pulses in all 4 extremities. Respiratory: Clear to auscultation with no wheezes or rales. Good inspiratory effort. Abdomen: Soft, nontender nondistended with normal bowel sounds x4 quadrants. Muskuloskeletal: No cyanosis, clubbing or edema noted bilaterally Neuro: Patient noted with chills.  CN II-XII intact, strength 5/5 x 4, sensation, reflexes intact Skin: No ulcerative lesions noted or rashes Psychiatry: Judgement and insight appear normal. Mood is appropriate for condition and setting          Labs on Admission:  Basic Metabolic Panel: Recent Labs  Lab 09/14/22 1614  NA 133*  K 4.2  CL 98  CO2 22  GLUCOSE 157*  BUN 23  CREATININE 0.79  CALCIUM 9.2   Liver Function Tests: Recent Labs  Lab 09/14/22 1614  AST 100*  ALT 77*  ALKPHOS 57  BILITOT 0.7  PROT 7.5  ALBUMIN 3.9   No results for input(s): "LIPASE", "AMYLASE" in the  last 168 hours. No results for input(s): "AMMONIA" in the last 168 hours. CBC: Recent Labs  Lab 09/14/22 1614  WBC 4.8  NEUTROABS 4.2  HGB 11.9*  HCT 35.1*  MCV 90.2  PLT 133*   Cardiac Enzymes: No results for input(s): "CKTOTAL", "CKMB", "CKMBINDEX", "TROPONINI" in the last 168 hours.  BNP (last 3 results) No results for input(s): "BNP" in the last 8760 hours.  ProBNP (last 3 results) No results for input(s): "PROBNP" in the last 8760 hours.  CBG: Recent Labs  Lab 09/14/22 1945  GLUCAP 115*    Radiological Exams on Admission: CT CHEST ABDOMEN PELVIS W CONTRAST  Result Date: 09/14/2022 CLINICAL DATA:  Sepsis and fever EXAM: CT CHEST, ABDOMEN, AND PELVIS WITH CONTRAST TECHNIQUE: Multidetector CT imaging of the chest, abdomen and pelvis was performed following the standard protocol during bolus administration of intravenous contrast. RADIATION DOSE REDUCTION: This exam was performed  according to the departmental dose-optimization program which includes automated exposure control, adjustment of the mA and/or kV according to patient size and/or use of iterative reconstruction technique. CONTRAST:  OMNIPAQUE IOHEXOL 300 MG/ML  SOLN COMPARISON:  Chest CT 08/15/2022 FINDINGS: CT CHEST FINDINGS Cardiovascular: Coronary, aortic arch, and branch vessel atherosclerotic vascular disease. AICD noted. Collateral venous flow from left upper extremity injection raising suspicion for brachiocephalic vein stenosis. Mediastinum/Nodes: Left infrahilar lymph node mildly enlarged 1.1 cm short axis on image 35 series 10. Lungs/Pleura: Centrilobular emphysema. Biapical pleuroparenchymal scarring. Mild dependent atelectasis in both lower lobes. Small right lung nodules on image 68, 102, and 134 of series 3 are stable from 07/06/2019 and considered benign. The largest of these is the 8 by 5 mm right lower lobe nodule on image 135 series 3. No further imaging workup of these lesions is indicated. Musculoskeletal: Thoracic spondylosis. Probable sebaceous cyst or similar benign lesion in the right anterior chest on image 36 series 10, stable. CT ABDOMEN PELVIS FINDINGS Hepatobiliary: Numerous hepatic cysts are redemonstrated. No further imaging workup of these lesions is indicated. Gallbladder unremarkable.  No biliary dilatation. Pancreas: Unremarkable Spleen: Punctate calcifications compatible with old granulomatous disease. Adrenals/Urinary Tract: Unremarkable Stomach/Bowel: Prominent stool throughout the colon favors constipation. Normal appendix. Vascular/Lymphatic: Atherosclerosis is present, including aortoiliac atherosclerotic disease. Reproductive: Prostatomegaly. Other: No supplemental non-categorized findings. Musculoskeletal: Degenerative arthropathy of both hips. Minimal grade 1 degenerative retrolisthesis at L2-3 and L3-4. Conjunction with lumbar spondylosis and degenerative disc disease there is  suspected right foraminal impingement at the L4-5 level. IMPRESSION: 1. No specific cause for sepsis is identified. 2. Prominent stool throughout the colon favors constipation. 3. Substantial Prostatomegaly. 4. Degenerative arthropathy of both hips. 5. Suspected right foraminal impingement at L4-5. 6. Collateral venous flow from left upper extremity injection raising suspicion for brachiocephalic vein stenosis. 7. Mildly enlarged left infrahilar lymph node, nonspecific. Aortic Atherosclerosis (ICD10-I70.0) and Emphysema (ICD10-J43.9). Electronically Signed   By: Gaylyn Rong M.D.   On: 09/14/2022 18:20   DG Chest Port 1 View  Result Date: 09/14/2022 CLINICAL DATA:  Questionable sepsis-evaluate for abnormality EXAM: PORTABLE CHEST 1 VIEW COMPARISON:  05/07/2019 radiograph and 08/15/2022 CT chest FINDINGS: Stable cardiomediastinal silhouette. Aortic atherosclerotic calcification. Left chest wall CRT-D. Chronic bronchitic changes. Bullous change in the right upper lobe. No focal consolidation, pleural effusion, or pneumothorax. No displaced rib fractures. IMPRESSION: No active disease.  Emphysema. Electronically Signed   By: Minerva Fester M.D.   On: 09/14/2022 17:02    EKG: I independently viewed the EKG done and my findings are as followed: Ventricular paced  rhythm at a rate of 83 bpm  Assessment/Plan Present on Admission:  Acute febrile illness  Type 2 diabetes mellitus with complication, without long-term current use of insulin (HCC)  Chronic systolic CHF (congestive heart failure) (HCC)  Principal Problem:   Acute febrile illness Active Problems:   Type 2 diabetes mellitus with complication, without long-term current use of insulin (HCC)   Chronic systolic CHF (congestive heart failure) (HCC)   SIRS (systemic inflammatory response syndrome) (HCC)   Generalized weakness   Thrombocytopenia (HCC)   Transaminitis  Acute febrile illness SIRS Unknown cause for patient's fever at this  time Patient was empirically treated with IV ceftriaxone and metronidazole, we shall continue with IV vancomycin and Zosyn Continue Tylenol as needed Blood culture, respiratory panel, procalcitonin pending  Generalized weakness Continue fall precaution Protein supplement will be provided  Type 2 diabetes mellitus with hyperglycemia Continue farxiga Continue ISS and hypoglycemia protocol  Thrombocytopenia possibly reactive Platelets 133, continue to monitor platelet levels  Transaminitis AST 100, ALT 77, patient denies any abdominal pain Continue to monitor liver enzymes and consider further workup including RUQ ultrasound, hepatitis panel if liver enzymes continue to stay elevated  Chronic systolic CHF Echocardiogram done on in April 2024 showed LVEF of 50 to 55%.  No RWMA.  Mild LVH. Continue farxiga Coreg, spironolactone, lisinopril will be held at this time due to soft BP   DVT prophylaxis: Eliquis  Advance Care Planning: Full code  Consults: None  Family Communication: Son at bedside (all questions answered to satisfaction)  Severity of Illness: The appropriate patient status for this patient is INPATIENT. Inpatient status is judged to be reasonable and necessary in order to provide the required intensity of service to ensure the patient's safety. The patient's presenting symptoms, physical exam findings, and initial radiographic and laboratory data in the context of their chronic comorbidities is felt to place them at high risk for further clinical deterioration. Furthermore, it is not anticipated that the patient will be medically stable for discharge from the hospital within 2 midnights of admission.   * I certify that at the point of admission it is my clinical judgment that the patient will require inpatient hospital care spanning beyond 2 midnights from the point of admission due to high intensity of service, high risk for further deterioration and high frequency of  surveillance required.*  Author: Frankey Shown, DO 09/15/2022 2:38 AM  For on call review [email or web address].

## 2022-09-14 NOTE — ED Notes (Signed)
Pt given dt gingerale and saltines

## 2022-09-15 ENCOUNTER — Encounter (HOSPITAL_COMMUNITY): Payer: Self-pay | Admitting: Internal Medicine

## 2022-09-15 DIAGNOSIS — D696 Thrombocytopenia, unspecified: Secondary | ICD-10-CM | POA: Insufficient documentation

## 2022-09-15 DIAGNOSIS — R6510 Systemic inflammatory response syndrome (SIRS) of non-infectious origin without acute organ dysfunction: Secondary | ICD-10-CM | POA: Insufficient documentation

## 2022-09-15 DIAGNOSIS — R531 Weakness: Secondary | ICD-10-CM

## 2022-09-15 DIAGNOSIS — R7401 Elevation of levels of liver transaminase levels: Secondary | ICD-10-CM | POA: Insufficient documentation

## 2022-09-15 DIAGNOSIS — R509 Fever, unspecified: Secondary | ICD-10-CM | POA: Diagnosis not present

## 2022-09-15 LAB — GLUCOSE, CAPILLARY
Glucose-Capillary: 276 mg/dL — ABNORMAL HIGH (ref 70–99)
Glucose-Capillary: 138 mg/dL — ABNORMAL HIGH (ref 70–99)
Glucose-Capillary: 146 mg/dL — ABNORMAL HIGH (ref 70–99)
Glucose-Capillary: 181 mg/dL — ABNORMAL HIGH (ref 70–99)
Glucose-Capillary: 238 mg/dL — ABNORMAL HIGH (ref 70–99)

## 2022-09-15 LAB — CBC
HCT: 32.1 % — ABNORMAL LOW (ref 39.0–52.0)
Hemoglobin: 10.7 g/dL — ABNORMAL LOW (ref 13.0–17.0)
MCH: 31 pg (ref 26.0–34.0)
MCHC: 33.3 g/dL (ref 30.0–36.0)
MCV: 93 fL (ref 80.0–100.0)
Platelets: 120 10*3/uL — ABNORMAL LOW (ref 150–400)
RBC: 3.45 MIL/uL — ABNORMAL LOW (ref 4.22–5.81)
RDW: 13.2 % (ref 11.5–15.5)
WBC: 3.6 10*3/uL — ABNORMAL LOW (ref 4.0–10.5)
nRBC: 0 % (ref 0.0–0.2)

## 2022-09-15 LAB — COMPREHENSIVE METABOLIC PANEL
ALT: 76 U/L — ABNORMAL HIGH (ref 0–44)
AST: 75 U/L — ABNORMAL HIGH (ref 15–41)
Albumin: 3.1 g/dL — ABNORMAL LOW (ref 3.5–5.0)
Alkaline Phosphatase: 47 U/L (ref 38–126)
Anion gap: 12 (ref 5–15)
BUN: 26 mg/dL — ABNORMAL HIGH (ref 8–23)
CO2: 23 mmol/L (ref 22–32)
Calcium: 8.4 mg/dL — ABNORMAL LOW (ref 8.9–10.3)
Chloride: 97 mmol/L — ABNORMAL LOW (ref 98–111)
Creatinine, Ser: 0.9 mg/dL (ref 0.61–1.24)
GFR, Estimated: >60 mL/min (ref >60)
Glucose, Bld: 257 mg/dL — ABNORMAL HIGH (ref 70–99)
Potassium: 3.5 mmol/L (ref 3.5–5.1)
Sodium: 132 mmol/L — ABNORMAL LOW (ref 135–145)
Total Bilirubin: 1 mg/dL (ref 0.3–1.2)
Total Protein: 6.1 g/dL — ABNORMAL LOW (ref 6.5–8.1)

## 2022-09-15 LAB — RESPIRATORY PANEL BY PCR

## 2022-09-15 LAB — HEMOGLOBIN A1C
Hgb A1c MFr Bld: 7.2 % — ABNORMAL HIGH (ref 4.8–5.6)
Mean Plasma Glucose: 159.94 mg/dL

## 2022-09-15 LAB — MAGNESIUM: Magnesium: 1.9 mg/dL (ref 1.7–2.4)

## 2022-09-15 LAB — PHOSPHORUS: Phosphorus: 4 mg/dL (ref 2.5–4.6)

## 2022-09-15 LAB — PROCALCITONIN: Procalcitonin: 0.91 ng/mL

## 2022-09-15 MED ORDER — SODIUM CHLORIDE 0.9 % IV SOLN: INTRAVENOUS | Status: DC

## 2022-09-15 MED ORDER — APIXABAN 2.5 MG PO TABS
2.5000 mg | ORAL_TABLET | Freq: Two times a day (BID) | ORAL | Status: DC
  Administered 2022-09-15 – 2022-09-16 (×2): 2.5 mg via ORAL
  Filled 2022-09-15 (×2): qty 1

## 2022-09-15 MED ORDER — ONDANSETRON HCL 4 MG PO TABS: 4.0000 mg | ORAL_TABLET | Freq: Four times a day (QID) | ORAL | Status: DC | PRN

## 2022-09-15 MED ORDER — SODIUM CHLORIDE 0.9 % IV BOLUS
250.0000 mL | Freq: Once | INTRAVENOUS | Status: AC
  Administered 2022-09-15: 250 mL via INTRAVENOUS

## 2022-09-15 MED ORDER — PIPERACILLIN-TAZOBACTAM 3.375 G IVPB
3.3750 g | Freq: Three times a day (TID) | INTRAVENOUS | Status: DC
  Administered 2022-09-15 – 2022-09-16 (×4): 3.375 g via INTRAVENOUS
  Filled 2022-09-15 (×3): qty 50

## 2022-09-15 MED ORDER — GLUCERNA SHAKE PO LIQD
237.0000 mL | Freq: Three times a day (TID) | ORAL | Status: DC
  Administered 2022-09-15 – 2022-09-16 (×5): 237 mL via ORAL

## 2022-09-15 MED ORDER — ACETAMINOPHEN 325 MG PO TABS
650.0000 mg | ORAL_TABLET | Freq: Four times a day (QID) | ORAL | Status: DC | PRN
  Administered 2022-09-15 – 2022-09-16 (×3): 650 mg via ORAL
  Filled 2022-09-15 (×3): qty 2

## 2022-09-15 MED ORDER — ACETAMINOPHEN 650 MG RE SUPP: 650.0000 mg | Freq: Four times a day (QID) | RECTAL | Status: DC | PRN

## 2022-09-15 MED ORDER — INSULIN ASPART 100 UNIT/ML IJ SOLN
0.0000 [IU] | Freq: Three times a day (TID) | INTRAMUSCULAR | Status: DC
  Administered 2022-09-16: 3 [IU] via SUBCUTANEOUS

## 2022-09-15 MED ORDER — ONDANSETRON HCL 4 MG/2ML IJ SOLN: 4.0000 mg | Freq: Four times a day (QID) | INTRAMUSCULAR | Status: DC | PRN

## 2022-09-15 MED ORDER — VANCOMYCIN HCL IN DEXTROSE 1-5 GM/200ML-% IV SOLN
1000.0000 mg | INTRAVENOUS | Status: DC
  Administered 2022-09-15: 1000 mg via INTRAVENOUS
  Filled 2022-09-15: qty 200

## 2022-09-15 MED ORDER — VANCOMYCIN HCL IN DEXTROSE 1-5 GM/200ML-% IV SOLN
1000.0000 mg | Freq: Once | INTRAVENOUS | Status: AC
  Administered 2022-09-15: 1000 mg via INTRAVENOUS
  Filled 2022-09-15: qty 200

## 2022-09-15 NOTE — Progress Notes (Signed)
Pharmacy Antibiotic Note  Richard Martin is a 82 y.o. male admitted on 09/14/2022 with fevers, possible sepsis.  Pharmacy has been consulted for Vancomycin and Zosyn  dosing.  Plan: Vancomycin 1000 mg IV q24h Zosyn 3.375 g IV q8h   Height: 5\' 10"  (177.8 cm) Weight: 56.9 kg (125 lb 7.1 oz) IBW/kg (Calculated) : 73  Temp (24hrs), Avg:100.7 F (38.2 C), Min:98.8 F (37.1 C), Max:102.6 F (39.2 C)  Recent Labs  Lab 09/14/22 1614 09/14/22 1825  WBC 4.8  --   CREATININE 0.79  --   LATICACIDVEN 1.6 1.8    Estimated Creatinine Clearance: 58.3 mL/min (by C-G formula based on SCr of 0.79 mg/dL).    Allergies  Allergen Reactions   Crab [Shellfish Allergy] Swelling     Eddie Candle 09/15/2022 2:24 AM

## 2022-09-15 NOTE — ED Notes (Signed)
Pt using urinal at bedside with family assistance

## 2022-09-15 NOTE — Plan of Care (Signed)

## 2022-09-15 NOTE — Progress Notes (Addendum)
[facility] PROGRESS NOTE  Richard Martin (DOB: 05-06-1940) MVH:[id number] PCP: Benita Stabile, MD Outpatient Specialists: Pulmonary, Dr. Vassie Loll; Cardiology, Dr. Bjorn Pippin  Brief Narrative: Richard Martin is an 82 y.o. male with a history of T2DM, HTN, HLD, chronic combined HFrEF who presented to the ED on 09/14/2022 with confusion, worsening fatigue for 3 days and generalized weakness, found to be febrile to 102.1F, tachypneic to 23/min with soft blood pressure which is near his baseline. WBC was 4.8k with lymphopenia (ALC 200), thrombocytopenia (acute), mild LFT elevations (AST 100, ALT 77), normal lactic acid. CXR demonstrated no pneumonia, UA demonstrated no WBCs, RBCs, rare bacteria. SARS-CoV-2 PCR and group A strep and full respiratory viral panels were negative. Procalcitonin 0.91. CT chest/abd/pelvis did not reveal a nidus of infection. Blood cultures were drawn, broad IV antibiotics initiated, and the patient was admitted. Continued to be febrile 7/15.     Subjective: Up and walking around better than before, feels stronger and mental status, while still abnormal last night, is returning closer to his normal per family at bedside. Ate bojangles well this AM.   Objective: BP (!) 126/92 (BP Location: Left Arm)   Pulse 82   Temp 99.2 F (37.3 C) (Oral)   Resp 20   Ht 5\' 10"  (1.778 m)   Wt 56.9 kg   SpO2 100%   BMI 18.00 kg/m   Gen: Elderly, pleasant male in no distress Pulm: Clear, nonlabored  CV: RRR, no MRG or pitting edema. Left sided JVD noted, absent on right. No facial or UE swelling. GI: Soft, NT, ND, +BS Neuro: Alert and oriented, frequent word blocking. No new focal deficits. Ext: Warm, no deformities Skin: PIV in RUE infiltrated and came out. No other rashes, lesions or ulcers on visualized skin. No erythema or tenderness over AICD pocket in left chest. No EM rash noted. No ticks attached on visualized skin  Assessment & Plan: SIRS without source, FUO: Many  findings suggestive of viral syndrome (thrombocytopenia, LFT elevations, generalized symptomatology, lymphopenia) though PCT elevated to 0.91.  - Continue monitoring blood cultures x48 hrs (NGTD currently, drawn 7/14 at ~16:00).  - Trend PCT in AM.  - On vancomycin, zosyn. Will check MRSA PCR, given no skin findings to suggest abscess, will likely be able to DC vancomycin (Cr rise since admission noted).   Generalized weakness: At risk for deconditioning.  - OOB, PT evaluation  T2DM:  - Continue SSI  Pancytopenia, lymphopenia: ANC is 4.2. Acute (baseline cell lines are normal). ?Viral syndrome.  - Continue monitoring w/diff in AM  LFT elevations: Mild, hepatocellular pattern, and improving.  - Monitor without further targeted evaluation for now.   Transaminitis AST 100, ALT 77, patient denies any abdominal pain Continue to monitor liver enzymes and consider further workup including RUQ ultrasound, hepatitis panel if liver enzymes continue to stay elevated   Chronic combined HFrEF: LVEF improved to 50-55% on last echo April 2024. Mild LVH, no RWMA.  - Given soft BP, we're still holding coreg, spironolactone, lisinopril, SGLT2i (suspected cause for glucosuria)   PAF, type II AVB s/p AICD:  - Holding coreg given soft BP and controlled/bradycardic at times heart rate. Can restart if indicated.  - Continue eliquis, dose reduced.  Hyponatremia: Monitor  Prostatomegaly: Confirmed on CT. No tenderness/evidence of prostatitis at this time - Consider outpatient urology f/u given his LUTS.   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 Consider outpatient work up. Despite JVD, do  not believe he's overloaded currently.   Degenerative arthropathy of both hips, right foraminal narrowing at L4-L5 (asymptomatic per pt).    Tyrone Nine, MD [facility] [email or web address] 09/15/2022, 2:42 PM

## 2022-09-16 DIAGNOSIS — R509 Fever, unspecified: Secondary | ICD-10-CM | POA: Diagnosis not present

## 2022-09-16 LAB — CBC WITH DIFFERENTIAL/PLATELET
Abs Immature Granulocytes: 0.03 10*3/uL (ref 0.00–0.07)
Basophils Absolute: 0 10*3/uL (ref 0.0–0.1)
Basophils Relative: 0 %
Eosinophils Absolute: 0.1 10*3/uL (ref 0.0–0.5)
Eosinophils Relative: 3 %
HCT: 34.7 % — ABNORMAL LOW (ref 39.0–52.0)
Hemoglobin: 11.5 g/dL — ABNORMAL LOW (ref 13.0–17.0)
Immature Granulocytes: 1 %
Lymphocytes Relative: 16 %
Lymphs Abs: 0.4 10*3/uL — ABNORMAL LOW (ref 0.7–4.0)
MCH: 30.7 pg (ref 26.0–34.0)
MCHC: 33.1 g/dL (ref 30.0–36.0)
MCV: 92.5 fL (ref 80.0–100.0)
Monocytes Absolute: 0.3 10*3/uL (ref 0.1–1.0)
Monocytes Relative: 12 %
Neutro Abs: 1.7 10*3/uL (ref 1.7–7.7)
Neutrophils Relative %: 68 %
Platelets: 117 10*3/uL — ABNORMAL LOW (ref 150–400)
RBC: 3.75 MIL/uL — ABNORMAL LOW (ref 4.22–5.81)
RDW: 13.2 % (ref 11.5–15.5)
WBC: 2.5 10*3/uL — ABNORMAL LOW (ref 4.0–10.5)
nRBC: 0 % (ref 0.0–0.2)

## 2022-09-16 LAB — COMPREHENSIVE METABOLIC PANEL
ALT: 114 U/L — ABNORMAL HIGH (ref 0–44)
AST: 100 U/L — ABNORMAL HIGH (ref 15–41)
Albumin: 3.3 g/dL — ABNORMAL LOW (ref 3.5–5.0)
Alkaline Phosphatase: 59 U/L (ref 38–126)
Anion gap: 12 (ref 5–15)
BUN: 24 mg/dL — ABNORMAL HIGH (ref 8–23)
CO2: 24 mmol/L (ref 22–32)
Calcium: 8.7 mg/dL — ABNORMAL LOW (ref 8.9–10.3)
Chloride: 96 mmol/L — ABNORMAL LOW (ref 98–111)
Creatinine, Ser: 0.9 mg/dL (ref 0.61–1.24)
GFR, Estimated: >60 mL/min (ref >60)
Glucose, Bld: 168 mg/dL — ABNORMAL HIGH (ref 70–99)
Potassium: 3.7 mmol/L (ref 3.5–5.1)
Sodium: 132 mmol/L — ABNORMAL LOW (ref 135–145)
Total Bilirubin: 0.7 mg/dL (ref 0.3–1.2)
Total Protein: 6.9 g/dL (ref 6.5–8.1)

## 2022-09-16 LAB — PROCALCITONIN: Procalcitonin: 0.87 ng/mL

## 2022-09-16 LAB — GLUCOSE, CAPILLARY
Glucose-Capillary: 205 mg/dL — ABNORMAL HIGH (ref 70–99)
Glucose-Capillary: 114 mg/dL — ABNORMAL HIGH (ref 70–99)

## 2022-09-16 MED ORDER — AMOXICILLIN-POT CLAVULANATE 875-125 MG PO TABS: 1.0000 | ORAL_TABLET | Freq: Two times a day (BID) | ORAL | 0 refills | Status: AC

## 2022-09-16 MED ORDER — DOXYCYCLINE HYCLATE 100 MG PO TABS: 100.0000 mg | ORAL_TABLET | Freq: Two times a day (BID) | ORAL | 0 refills | Status: AC

## 2022-09-16 MED ORDER — AMOXICILLIN-POT CLAVULANATE 875-125 MG PO TABS
1.0000 | ORAL_TABLET | Freq: Two times a day (BID) | ORAL | Status: DC
  Administered 2022-09-16: 1 via ORAL
  Filled 2022-09-16: qty 1

## 2022-09-16 MED ORDER — DOXYCYCLINE HYCLATE 100 MG PO TABS
100.0000 mg | ORAL_TABLET | Freq: Two times a day (BID) | ORAL | Status: DC
  Administered 2022-09-16: 100 mg via ORAL
  Filled 2022-09-16: qty 1

## 2022-09-16 NOTE — Progress Notes (Signed)
Mobility Specialist Progress Note:   09/16/22 1357  Mobility  Activity Ambulated independently in room  Level of Assistance Standby assist, set-up cues, supervision of patient - no hands on  Assistive Device None  Distance Ambulated (ft) 10 ft  Range of Motion/Exercises Active;All extremities  Activity Response Tolerated well  Mobility Referral Yes  $Mobility charge 1 Mobility  Mobility Specialist Start Time (ACUTE ONLY) 1345  Mobility Specialist Stop Time (ACUTE ONLY) 1350  Mobility Specialist Time Calculation (min) (ACUTE ONLY) 5 min   Pt eager for discharge. Ambulated in the room independently, wife at bedside. Tolerated well, asx throughout. Left pt in room with wife, all needs met.   Feliciana Rossetti Mobility Specialist Please contact via Special educational needs teacher or  Rehab office at [phone]

## 2022-09-16 NOTE — Discharge Summary (Signed)
Physician Discharge Summary   Patient: Richard Martin MRN: [id number] DOB: October 27, 1940  Admit date:     09/14/2022  Discharge date: 09/16/22  Discharge Physician: Tyrone Nine   PCP: Benita Stabile, MD   Recommendations at discharge:  Follow up with PCP in the next week.  Suggest recheck CBC w/diff and CMP.  Final blood culture results will be followed by discharging physician. Consider outpatient urology follow up for BPH w/LUTS.  Discharge Diagnoses: Principal Problem:   Acute febrile illness Active Problems:   Type 2 diabetes mellitus with complication, without long-term current use of insulin (HCC)   Chronic systolic CHF (congestive heart failure) (HCC)   SIRS (systemic inflammatory response syndrome) (HCC)   Generalized weakness   Thrombocytopenia (HCC)   Transaminitis  Hospital Course: Richard Martin is an 82 y.o. male with a history of T2DM, HTN, HLD, chronic combined HFrEF who presented to the ED on 09/14/2022 with confusion, worsening fatigue for 3 days and generalized weakness, found to be febrile to 102.57F, tachypneic to 23/min with soft blood pressure which is near his baseline. WBC was 4.8k with lymphopenia (ALC 200), thrombocytopenia (acute), mild LFT elevations (AST 100, ALT 77), normal lactic acid. CXR demonstrated no pneumonia, UA demonstrated no WBCs, RBCs, rare bacteria. SARS-CoV-2 PCR and group A strep and full respiratory viral panels were negative. Procalcitonin 0.91. CT chest/abd/pelvis did not reveal a nidus of infection. Blood cultures were drawn, broad IV antibiotics initiated, and the patient was admitted. Last febrile >24 hours prior to discharge, blood cultures NGTD x48 hours at time of discharge. Patient feeling well, back to his baseline per family.   Assessment and Plan: SIRS without source, FUO: Many findings suggestive of viral syndrome (thrombocytopenia, LFT elevations, generalized symptomatology, lymphopenia) though viral panels have been negative  and PCT elevated to 0.91. PCT remains elevated but declining. - Blood cultures remain negative x48 hrs   - Will discharge on doxycycline and augmentin empirically given his high risk of adverse clinical outcome - RMSF titers pending at discharge.    Generalized weakness: At risk for deconditioning.  - OOB, PT evaluation recommended continued AD use at home (has all DME) and no further follow up. Had a reported slip in bathroom while in hospital. This demonstrated the risk to ongoing inpatient monitoring and management which is now felt to outweigh potential benefit.    T2DM:  - Continue home Tx   Pancytopenia, lymphopenia: ANC remains >1,500. Acute (baseline cell lines are normal). ?Viral syndrome.  - Continue monitoring w/diff at follow up   LFT elevations: Mild, hepatocellular pattern. - Monitor without further targeted evaluation for now.    Chronic combined HFrEF: LVEF improved to 50-55% on last echo April 2024. Mild LVH, no RWMA.  - Continue home medications, normotensive   PAF, type II AVB s/p AICD:  - Continue home BB - Continue eliquis, dose reduced.   Hyponatremia: Stable.  - Monitor at follow up.   Prostatomegaly: Confirmed on CT. No tenderness/evidence of prostatitis at this time - Consider outpatient urology f/u given his LUTS.    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 Consider outpatient work up. Despite JVD, do not believe he's overloaded currently.    Degenerative arthropathy of both hips, right foraminal narrowing at L4-L5 (asymptomatic per pt).   Consultants: None Procedures performed: None  Disposition: Home Diet recommendation: Cardiac, carb-modified DISCHARGE MEDICATION: Allergies as of 09/16/2022       Reactions  Crab [shellfish Allergy] Swelling        Medication List     TAKE these medications    acetaminophen 500 MG tablet Commonly known as: TYLENOL Take 500 mg by mouth daily as  needed for moderate pain or headache.   amoxicillin-clavulanate 875-125 MG tablet Commonly known as: AUGMENTIN Take 1 tablet by mouth every 12 (twelve) hours for 7 days.   carvedilol 12.5 MG tablet Commonly known as: COREG TAKE (1) TABLET BY MOUTH TWICE DAILY WITH MEALS. What changed: See the new instructions.   cholecalciferol 25 MCG (1000 UNIT) tablet Commonly known as: VITAMIN D3 Take 1,000 Units by mouth daily.   doxycycline 100 MG tablet Commonly known as: VIBRA-TABS Take 1 tablet (100 mg total) by mouth every 12 (twelve) hours for 7 days.   Eliquis 2.5 MG Tabs tablet Generic drug: apixaban TAKE (1) TABLET TWICE DAILY. What changed: See the new instructions.   Farxiga 10 MG Tabs tablet Generic drug: dapagliflozin propanediol Take 10 mg by mouth daily. Take 1 tablet by mouth every day   lisinopril 20 MG tablet Commonly known as: ZESTRIL TAKE (1) TABLET BY MOUTH ONCE DAILY. What changed: See the new instructions.   metFORMIN 500 MG tablet Commonly known as: GLUCOPHAGE Take 1 tablet (500 mg total) by mouth 2 (two) times daily.   polyvinyl alcohol 1.4 % ophthalmic solution Commonly known as: LIQUIFILM TEARS Apply to eye.   rosuvastatin 10 MG tablet Commonly known as: CRESTOR Take 10 mg by mouth daily.   spironolactone 25 MG tablet Commonly known as: ALDACTONE TAKE 1/2 TABLET BY MOUTH DAILY.   zinc gluconate 50 MG tablet Take 50 mg by mouth daily.        Follow-up Information     Benita Stabile, MD Follow up.   Specialty: Internal Medicine Contact information: [address] [place] [address] [phone]                Discharge Exam: Filed Weights   09/14/22 1540 09/15/22 0101  Weight: 59.9 kg 56.9 kg  BP (!) 123/56   Pulse 73   Temp 97.7 F (36.5 C) (Oral)   Resp 20   Ht 5\' 10"  (1.778 m)   Wt 56.9 kg   SpO2 93%   BMI 18.00 kg/m   Grandson at bedside has assisted pt, reports he's back to his normal self. Pt went to the  bathroom in the middle of the night, reports slipping on slick floor despite wearing grippy socks, backed up onto the wall and slowly slid himself down. He was able to rise right away and had no head trauma.  Alert, oriented, elderly pleasant talkative male in no distress Clear, nonlabored RRR, no MRG or pitting edema Soft, NT, ND, +BS No rashes.   Condition at discharge: stable  The results of significant diagnostics from this hospitalization (including imaging, microbiology, ancillary and laboratory) are listed below for reference.   Imaging Studies: CT CHEST ABDOMEN PELVIS W CONTRAST  Result Date: 09/14/2022 CLINICAL DATA:  Sepsis and fever EXAM: CT CHEST, ABDOMEN, AND PELVIS WITH CONTRAST TECHNIQUE: Multidetector CT imaging of the chest, abdomen and pelvis was performed following the standard protocol during bolus administration of intravenous contrast. RADIATION DOSE REDUCTION: This exam was performed according to the departmental dose-optimization program which includes automated exposure control, adjustment of the mA and/or kV according to patient size and/or use of iterative reconstruction technique. CONTRAST:  OMNIPAQUE IOHEXOL 300 MG/ML  SOLN COMPARISON:  Chest CT 08/15/2022  FINDINGS: CT CHEST FINDINGS Cardiovascular: Coronary, aortic arch, and branch vessel atherosclerotic vascular disease. AICD noted. Collateral venous flow from left upper extremity injection raising suspicion for brachiocephalic vein stenosis. Mediastinum/Nodes: Left infrahilar lymph node mildly enlarged 1.1 cm short axis on image 35 series 10. Lungs/Pleura: Centrilobular emphysema. Biapical pleuroparenchymal scarring. Mild dependent atelectasis in both lower lobes. Small right lung nodules on image 68, 102, and 134 of series 3 are stable from 07/06/2019 and considered benign. The largest of these is the 8 by 5 mm right lower lobe nodule on image 135 series 3. No further imaging workup of these lesions is indicated.  Musculoskeletal: Thoracic spondylosis. Probable sebaceous cyst or similar benign lesion in the right anterior chest on image 36 series 10, stable. CT ABDOMEN PELVIS FINDINGS Hepatobiliary: Numerous hepatic cysts are redemonstrated. No further imaging workup of these lesions is indicated. Gallbladder unremarkable.  No biliary dilatation. Pancreas: Unremarkable Spleen: Punctate calcifications compatible with old granulomatous disease. Adrenals/Urinary Tract: Unremarkable Stomach/Bowel: Prominent stool throughout the colon favors constipation. Normal appendix. Vascular/Lymphatic: Atherosclerosis is present, including aortoiliac atherosclerotic disease. Reproductive: Prostatomegaly. Other: No supplemental non-categorized findings. Musculoskeletal: Degenerative arthropathy of both hips. Minimal grade 1 degenerative retrolisthesis at L2-3 and L3-4. Conjunction with lumbar spondylosis and degenerative disc disease there is suspected right foraminal impingement at the L4-5 level. IMPRESSION: 1. No specific cause for sepsis is identified. 2. Prominent stool throughout the colon favors constipation. 3. Substantial Prostatomegaly. 4. Degenerative arthropathy of both hips. 5. Suspected right foraminal impingement at L4-5. 6. Collateral venous flow from left upper extremity injection raising suspicion for brachiocephalic vein stenosis. 7. Mildly enlarged left infrahilar lymph node, nonspecific. Aortic Atherosclerosis (ICD10-I70.0) and Emphysema (ICD10-J43.9). Electronically Signed   By: Gaylyn Rong M.D.   On: 09/14/2022 18:20   DG Chest Port 1 View  Result Date: 09/14/2022 CLINICAL DATA:  Questionable sepsis-evaluate for abnormality EXAM: PORTABLE CHEST 1 VIEW COMPARISON:  05/07/2019 radiograph and 08/15/2022 CT chest FINDINGS: Stable cardiomediastinal silhouette. Aortic atherosclerotic calcification. Left chest wall CRT-D. Chronic bronchitic changes. Bullous change in the right upper lobe. No focal consolidation,  pleural effusion, or pneumothorax. No displaced rib fractures. IMPRESSION: No active disease.  Emphysema. Electronically Signed   By: Minerva Fester M.D.   On: 09/14/2022 17:02    Microbiology: Results for orders placed or performed during the hospital encounter of 09/14/22  SARS Coronavirus 2 by RT PCR (hospital order, performed in [facility] hospital lab) *cepheid single result test* Throat     Status: None   Collection Time: 09/14/22  3:52 PM   Specimen: Throat; Nasal Swab  Result Value Ref Range Status   SARS Coronavirus 2 by RT PCR NEGATIVE NEGATIVE Final    Comment: (NOTE) SARS-CoV-2 target nucleic acids are NOT DETECTED.  The SARS-CoV-2 RNA is generally detectable in upper and lower respiratory specimens during the acute phase of infection. The lowest concentration of SARS-CoV-2 viral copies this assay can detect is 250 copies / mL. A negative result does not preclude SARS-CoV-2 infection and should not be used as the sole basis for treatment or other patient management decisions.  A negative result may occur with improper specimen collection / handling, submission of specimen other than nasopharyngeal swab, presence of viral mutation(s) within the areas targeted by this assay, and inadequate number of viral copies (<250 copies / mL). A negative result must be combined with clinical observations, patient history, and epidemiological information.  Fact Sheet for Patients:   RoadLapTop.co.za  Fact Sheet for Healthcare Providers: http://kim-miller.com/  This  test is not yet approved or  cleared by the [place] and has been authorized for detection and/or diagnosis of SARS-CoV-2 by FDA under an Emergency Use Authorization (EUA).  This EUA will remain in effect (meaning this test can be used) for the duration of the COVID-19 declaration under Section 564(b)(1) of the Act, 21 U.S.C. section 360bbb-3(b)(1), unless the  authorization is terminated or revoked sooner.  Performed at [facility], [address], [place], [place] [address]   Group A Strep by PCR     Status: None   Collection Time: 09/14/22  3:52 PM   Specimen: Throat; Sterile Swab  Result Value Ref Range Status   Group A Strep by PCR NOT DETECTED NOT DETECTED Final    Comment: Performed at [facility], [address], [place], [place] [address]  Blood Culture (routine x 2)     Status: None (Preliminary result)   Collection Time: 09/14/22  4:14 PM   Specimen: Right Antecubital; Blood  Result Value Ref Range Status   Specimen Description   Final    RIGHT ANTECUBITAL BOTTLES DRAWN AEROBIC AND ANAEROBIC   Special Requests   Final    Blood Culture results may not be optimal due to an excessive volume of blood received in culture bottles   Culture   Final    NO GROWTH 2 DAYS Performed at [facility], [address], [place], [place] [address]    Report Status PENDING  Incomplete  Blood Culture (routine x 2)     Status: None (Preliminary result)   Collection Time: 09/14/22  4:14 PM   Specimen: Left Antecubital; Blood  Result Value Ref Range Status   Specimen Description   Final    LEFT ANTECUBITAL BOTTLES DRAWN AEROBIC AND ANAEROBIC   Special Requests   Final    Blood Culture results may not be optimal due to an excessive volume of blood received in culture bottles   Culture   Final    NO GROWTH 2 DAYS Performed at [facility], [address], [place], [place] [address]    Report Status PENDING  Incomplete  Respiratory (~20 pathogens) panel by PCR     Status: None   Collection Time: 09/15/22  6:00 AM   Specimen: Nasopharyngeal Swab; Respiratory  Result Value Ref Range Status   Adenovirus NOT DETECTED NOT DETECTED Final   Coronavirus 229E NOT DETECTED NOT DETECTED Final    Comment: (NOTE) The Coronavirus on the Respiratory Panel, DOES NOT test for the novel  Coronavirus (2019 nCoV)    Coronavirus HKU1 NOT DETECTED NOT DETECTED  Final   Coronavirus NL63 NOT DETECTED NOT DETECTED Final   Coronavirus OC43 NOT DETECTED NOT DETECTED Final   Metapneumovirus NOT DETECTED NOT DETECTED Final   Rhinovirus / Enterovirus NOT DETECTED NOT DETECTED Final   Influenza A NOT DETECTED NOT DETECTED Final   Influenza B NOT DETECTED NOT DETECTED Final   Parainfluenza Virus 1 NOT DETECTED NOT DETECTED Final   Parainfluenza Virus 2 NOT DETECTED NOT DETECTED Final   Parainfluenza Virus 3 NOT DETECTED NOT DETECTED Final   Parainfluenza Virus 4 NOT DETECTED NOT DETECTED Final   Respiratory Syncytial Virus NOT DETECTED NOT DETECTED Final   Bordetella pertussis NOT DETECTED NOT DETECTED Final   Bordetella Parapertussis NOT DETECTED NOT DETECTED Final   Chlamydophila pneumoniae NOT DETECTED NOT DETECTED Final   Mycoplasma pneumoniae NOT DETECTED NOT DETECTED Final    Comment: Performed at [facility], [address]  [place], [place] [address]    Labs: CBC: Recent Labs  Lab 09/14/22 1614 09/15/22 0434 09/16/22 0411  WBC 4.8 3.6* 2.5*  NEUTROABS 4.2  --  1.7  HGB 11.9* 10.7* 11.5*  HCT 35.1* 32.1* 34.7*  MCV 90.2 93.0 92.5  PLT 133* 120* 117*   Basic Metabolic Panel: Recent Labs  Lab 09/14/22 1614 09/15/22 0434 09/16/22 0411  NA 133* 132* 132*  K 4.2 3.5 3.7  CL 98 97* 96*  CO2 22 23 24   GLUCOSE 157* 257* 168*  BUN 23 26* 24*  CREATININE 0.79 0.90 0.90  CALCIUM 9.2 8.4* 8.7*  MG  --  1.9  --   PHOS  --  4.0  --    Liver Function Tests: Recent Labs  Lab 09/14/22 1614 09/15/22 0434 09/16/22 0411  AST 100* 75* 100*  ALT 77* 76* 114*  ALKPHOS 57 47 59  BILITOT 0.7 1.0 0.7  PROT 7.5 6.1* 6.9  ALBUMIN 3.9 3.1* 3.3*   CBG: Recent Labs  Lab 09/15/22 1109 09/15/22 1648 09/15/22 2052 09/16/22 0710 09/16/22 1152  GLUCAP 146* 181* 238* 205* 114*    Discharge time spent: greater than 30 minutes.  Signed: Tyrone Nine, MD [facility] 09/16/2022

## 2022-09-16 NOTE — Evaluation (Signed)
Physical Therapy Evaluation Patient Details Name: JEROLD YOSS MRN: [id number] DOB: 08-13-1940 Today's Date: 09/16/2022  History of Present Illness  Richard Martin is a 82 y.o. male with medical history significant of hypertension, hyperlipidemia, type 2 diabetes mellitus, chronic combined systolic and diastolic CHF who presents to the emergency department after being brought by wife.  Patient started to feel sick a few days ago and this worsened last evening.  Today, patient slept late, he was weak and required assistance for ambulation (different from baseline, ambulates without assistance, drives car), on checking his temperature, it was noted to be 102F.  Patient [place] and [place] bites all the time but was not aware of any tick bite.  He denies nausea, vomiting, headache, rashes, chest pain, shortness of breath, abdominal pain.  Patient denies any recent travel or sick contacts.  Clinical Impression   Pt able to tolerate transferring from bed to chair, as well as ambulation with no loss of balance nor AD. Pt tolerated sitting up in chair after therapy. Pt functioning near baseline. Patient discharged to care of nursing for ambulation daily as tolerated for length of stay.      Assistance Recommended at Discharge    If plan is discharge home, recommend the following:  Can travel by private vehicle  A little help with walking and/or transfers;A little help with bathing/dressing/bathroom        Equipment Recommendations Rolling walker (2 wheels);Cane  Recommendations for Other Services       Functional Status Assessment Patient has had a recent decline in their functional status and demonstrates the ability to make significant improvements in function in a reasonable and predictable amount of time.     Precautions / Restrictions Precautions Precautions: None Restrictions Weight Bearing Restrictions: No      Mobility  Bed Mobility Overal bed  mobility: Independent                  Transfers Overall transfer level: Independent                      Ambulation/Gait Ambulation/Gait assistance: Independent Gait Distance (Feet): 150 Feet Assistive device: None Gait Pattern/deviations: WFL(Within Functional Limits), Step-through pattern Gait velocity: within normal function     General Gait Details: Pt able to ambulate safley without use of AD  Stairs            Wheelchair Mobility     Tilt Bed    Modified Rankin (Stroke Patients Only)       Balance Overall balance assessment: Independent                                           Pertinent Vitals/Pain Pain Assessment Pain Assessment: 0-10 Pain Score: 2  Pain Location: low back Pain Descriptors / Indicators: Dull    Home Living Family/patient expects to be discharged to:: Private residence Living Arrangements: Spouse/significant other Available Help at Discharge: Family Type of Home: House         Home Layout: One level Home Equipment: Agricultural consultant (2 wheels);Cane - single point Additional Comments: Has RW and cane available at home but previously did not frequently use them    Prior Function Prior Level of Function : Independent/Modified Independent             Mobility Comments: Able to  bath, dress, cook independently       Hand Dominance        Extremity/Trunk Assessment                Communication   Communication: No difficulties  Cognition Arousal/Alertness: Awake/alert Behavior During Therapy: WFL for tasks assessed/performed Overall Cognitive Status: Within Functional Limits for tasks assessed                                          General Comments      Exercises     Assessment/Plan    PT Assessment Patient does not need any further PT services  PT Problem List Decreased strength;Decreased balance;Decreased activity tolerance       PT Treatment  Interventions      PT Goals (Current goals can be found in the Care Plan section)  Acute Rehab PT Goals Patient Stated Goal: Return home to family assist PT Goal Formulation: With patient Time For Goal Achievement: 09/16/22 Potential to Achieve Goals: Good    Frequency       Co-evaluation               AM-PAC PT "6 Clicks" Mobility  Outcome Measure Help needed turning from your back to your side while in a flat bed without using bedrails?: None Help needed moving from lying on your back to sitting on the side of a flat bed without using bedrails?: None Help needed moving to and from a bed to a chair (including a wheelchair)?: None Help needed standing up from a chair using your arms (e.g., wheelchair or bedside chair)?: None Help needed to walk in hospital room?: None Help needed climbing 3-5 steps with a railing? : None 6 Click Score: 24    End of Session   Activity Tolerance: Patient tolerated treatment well;No increased pain Patient left: in chair;with call bell/phone within reach Nurse Communication: Mobility status PT Visit Diagnosis: Unsteadiness on feet (R26.81);Other abnormalities of gait and mobility (R26.89);Muscle weakness (generalized) (M62.81);History of falling (Z91.81)    Time: 1610-9604 PT Time Calculation (min) (ACUTE ONLY): 21 min   Charges:   PT Evaluation $PT Eval Moderate Complexity: 1 Mod PT Treatments $Therapeutic Activity: 8-22 mins PT General Charges $$ ACUTE PT VISIT: 1 Visit        Login Muckleroy SPT [place], DPT Program

## 2022-09-16 NOTE — Care Management Important Message (Signed)
Important Message  Patient Details  Name: Richard Martin MRN: [id number] Date of Birth: 05/16/1940   Medicare Important Message Given:  N/A - LOS <3 / Initial given by admissions     Corey Harold 09/16/2022, 10:59 AM

## 2022-09-16 NOTE — Progress Notes (Signed)
   09/16/22 1328  TOC Brief Assessment  Insurance and Status Reviewed  Patient has primary care physician Yes  Home environment has been reviewed Home with spouse  Prior level of function: No PT follow up recommended  Prior/Current Home Services No current home services  Social Determinants of Health Reivew SDOH reviewed no interventions necessary  Readmission risk has been reviewed Yes  Transition of care needs no transition of care needs at this time

## 2022-09-16 NOTE — Progress Notes (Signed)
This RN called to pt room by tech and observed pt lying in floor in bathroom. Denies hitting head at time states he slid down the wall. Skin tear noted to posterior (R) arm pt c/o back pain. Dr. Thomes Dinning notified. Pt grandson in room at time of fall. Bed in lowest position, bed alarm on, call bell in reach.

## 2022-09-17 LAB — SPOTTED FEVER GROUP ANTIBODIES
Spotted Fever Group IgG: <1:64 {titer}
Spotted Fever Group IgM: <1:64 {titer}

## 2022-09-18 ENCOUNTER — Encounter (HOSPITAL_COMMUNITY): Payer: Self-pay

## 2022-09-18 ENCOUNTER — Emergency Department (HOSPITAL_COMMUNITY)
Admission: EM | Admit: 2022-09-18 | Discharge: 2022-09-18 | Discharge status: Against Medical Advice | Payer: Medicare Other | Attending: Emergency Medicine | Admitting: Emergency Medicine

## 2022-09-18 ENCOUNTER — Other Ambulatory Visit: Payer: Self-pay

## 2022-09-18 ENCOUNTER — Emergency Department (HOSPITAL_COMMUNITY): Payer: Medicare Other

## 2022-09-18 DIAGNOSIS — M545 Low back pain, unspecified: Secondary | ICD-10-CM | POA: Insufficient documentation

## 2022-09-18 DIAGNOSIS — Z5321 Procedure and treatment not carried out due to patient leaving prior to being seen by health care provider: Secondary | ICD-10-CM | POA: Insufficient documentation

## 2022-09-18 DIAGNOSIS — K5900 Constipation, unspecified: Secondary | ICD-10-CM | POA: Diagnosis not present

## 2022-09-18 DIAGNOSIS — R21 Rash and other nonspecific skin eruption: Secondary | ICD-10-CM | POA: Insufficient documentation

## 2022-09-18 DIAGNOSIS — S3992XA Unspecified injury of lower back, initial encounter: Secondary | ICD-10-CM | POA: Diagnosis not present

## 2022-09-18 DIAGNOSIS — M47816 Spondylosis without myelopathy or radiculopathy, lumbar region: Secondary | ICD-10-CM | POA: Diagnosis not present

## 2022-09-18 DIAGNOSIS — M8580 Other specified disorders of bone density and structure, unspecified site: Secondary | ICD-10-CM | POA: Diagnosis not present

## 2022-09-18 LAB — BASIC METABOLIC PANEL
Anion gap: 15 (ref 5–15)
BUN: 20 mg/dL (ref 8–23)
CO2: 21 mmol/L — ABNORMAL LOW (ref 22–32)
Calcium: 8.8 mg/dL — ABNORMAL LOW (ref 8.9–10.3)
Chloride: 97 mmol/L — ABNORMAL LOW (ref 98–111)
Creatinine, Ser: 0.72 mg/dL (ref 0.61–1.24)
GFR, Estimated: >60 mL/min (ref >60)
Glucose, Bld: 134 mg/dL — ABNORMAL HIGH (ref 70–99)
Potassium: 4 mmol/L (ref 3.5–5.1)
Sodium: 133 mmol/L — ABNORMAL LOW (ref 135–145)

## 2022-09-18 NOTE — ED Triage Notes (Signed)
Pt has had constipationx5d. Pt has had stool softeners several times and miralax. Pt's wife tried to give pt an enema, but it wouldn't work. Pt's wife gave pt a suppository and all he did was pass gas, but no BM. Pt states a few hours after discharge his abd was distended and hard. Pt states he passed gas then ate a little and immediately his abdomen felt full. Pt c/o rash on abd and back since D/C from hosp. Pt has raised red bumps on entire back and a few on abdomen. Pt states it doesn't itch. PT c/o left lower back pain from when he fell in the hosp.

## 2022-09-18 NOTE — ED Notes (Addendum)
Pt's family decided to leave the ED and will go to their primary care to look at the results and will look on Pt's Mychart for results. Pt seen leaving the ED

## 2022-09-19 LAB — CULTURE, BLOOD (ROUTINE X 2)
Culture: NO GROWTH
Culture: NO GROWTH

## 2022-09-20 ENCOUNTER — Emergency Department (HOSPITAL_BASED_OUTPATIENT_CLINIC_OR_DEPARTMENT_OTHER): Payer: Medicare Other

## 2022-09-20 ENCOUNTER — Encounter (HOSPITAL_BASED_OUTPATIENT_CLINIC_OR_DEPARTMENT_OTHER): Payer: Self-pay

## 2022-09-20 ENCOUNTER — Emergency Department (HOSPITAL_BASED_OUTPATIENT_CLINIC_OR_DEPARTMENT_OTHER)
Admission: EM | Admit: 2022-09-20 | Discharge: 2022-09-20 | Disposition: A | Discharge status: Home / Self Care | Payer: Medicare Other | Attending: Emergency Medicine | Admitting: Emergency Medicine

## 2022-09-20 DIAGNOSIS — Z7984 Long term (current) use of oral hypoglycemic drugs: Secondary | ICD-10-CM | POA: Insufficient documentation

## 2022-09-20 DIAGNOSIS — N200 Calculus of kidney: Secondary | ICD-10-CM | POA: Diagnosis not present

## 2022-09-20 DIAGNOSIS — K5900 Constipation, unspecified: Secondary | ICD-10-CM | POA: Diagnosis not present

## 2022-09-20 DIAGNOSIS — K7689 Other specified diseases of liver: Secondary | ICD-10-CM | POA: Diagnosis not present

## 2022-09-20 DIAGNOSIS — R935 Abnormal findings on diagnostic imaging of other abdominal regions, including retroperitoneum: Secondary | ICD-10-CM | POA: Diagnosis not present

## 2022-09-20 DIAGNOSIS — I509 Heart failure, unspecified: Secondary | ICD-10-CM | POA: Insufficient documentation

## 2022-09-20 DIAGNOSIS — D649 Anemia, unspecified: Secondary | ICD-10-CM | POA: Insufficient documentation

## 2022-09-20 DIAGNOSIS — E119 Type 2 diabetes mellitus without complications: Secondary | ICD-10-CM | POA: Diagnosis not present

## 2022-09-20 DIAGNOSIS — N400 Enlarged prostate without lower urinary tract symptoms: Secondary | ICD-10-CM | POA: Diagnosis not present

## 2022-09-20 DIAGNOSIS — Z7901 Long term (current) use of anticoagulants: Secondary | ICD-10-CM | POA: Diagnosis not present

## 2022-09-20 DIAGNOSIS — R109 Unspecified abdominal pain: Secondary | ICD-10-CM | POA: Diagnosis present

## 2022-09-20 LAB — CBC WITH DIFFERENTIAL/PLATELET
Abs Immature Granulocytes: 0.04 10*3/uL (ref 0.00–0.07)
Basophils Absolute: 0 10*3/uL (ref 0.0–0.1)
Basophils Relative: 0 %
Eosinophils Absolute: 0.2 10*3/uL (ref 0.0–0.5)
Eosinophils Relative: 2 %
HCT: 37.9 % — ABNORMAL LOW (ref 39.0–52.0)
Hemoglobin: 12.8 g/dL — ABNORMAL LOW (ref 13.0–17.0)
Immature Granulocytes: 1 %
Lymphocytes Relative: 16 %
Lymphs Abs: 1.2 10*3/uL (ref 0.7–4.0)
MCH: 30.3 pg (ref 26.0–34.0)
MCHC: 33.8 g/dL (ref 30.0–36.0)
MCV: 89.8 fL (ref 80.0–100.0)
Monocytes Absolute: 0.7 10*3/uL (ref 0.1–1.0)
Monocytes Relative: 10 %
Neutro Abs: 5.1 10*3/uL (ref 1.7–7.7)
Neutrophils Relative %: 71 %
Platelets: 247 10*3/uL (ref 150–400)
RBC: 4.22 MIL/uL (ref 4.22–5.81)
RDW: 13.6 % (ref 11.5–15.5)
WBC: 7.2 10*3/uL (ref 4.0–10.5)
nRBC: 0 % (ref 0.0–0.2)

## 2022-09-20 LAB — COMPREHENSIVE METABOLIC PANEL
ALT: 41 U/L (ref 0–44)
AST: 18 U/L (ref 15–41)
Albumin: 4.1 g/dL (ref 3.5–5.0)
Alkaline Phosphatase: 68 U/L (ref 38–126)
Anion gap: 13 (ref 5–15)
BUN: 23 mg/dL (ref 8–23)
CO2: 25 mmol/L (ref 22–32)
Calcium: 9.7 mg/dL (ref 8.9–10.3)
Chloride: 101 mmol/L (ref 98–111)
Creatinine, Ser: 0.6 mg/dL — ABNORMAL LOW (ref 0.61–1.24)
GFR, Estimated: >60 mL/min (ref >60)
Glucose, Bld: 140 mg/dL — ABNORMAL HIGH (ref 70–99)
Potassium: 4 mmol/L (ref 3.5–5.1)
Sodium: 139 mmol/L (ref 135–145)
Total Bilirubin: 0.7 mg/dL (ref 0.3–1.2)
Total Protein: 7.3 g/dL (ref 6.5–8.1)

## 2022-09-20 MED ORDER — IOHEXOL 300 MG/ML  SOLN
100.0000 mL | Freq: Once | INTRAMUSCULAR | Status: AC | PRN
  Administered 2022-09-20: 75 mL via INTRAVENOUS

## 2022-09-20 NOTE — ED Provider Notes (Signed)
[place] EMERGENCY DEPARTMENT AT [facility] Provider Note   CSN: [id number] Arrival date & time: 09/20/22  1216     History  Chief Complaint  Patient presents with   Constipation    Richard Martin is a 82 y.o. male who presents to the emergency department with his wife and his daughter at the bedside with concern for constipation since he was discharged from the hospital.  Patient was admitted to hospital for sepsis with unknown source, discharged on Doxy and Augmentin.  He states that he has not had any bowel movements since 7/14.  States has been taking stool softeners and laxatives at home which will induce cramping of his abdomen and may be a single episode of flatus in the evenings but has not had any consistent flatus or bowel movement since 6 days.  Abdomen is distended, uncomfortable, and he is feeling progressively more nauseous over the last 24 hours.  No fevers or chills.  No confusion.  I personally reviewed his medical records.  History of type 2 diabetes, CHF, anticoagulated on Eliquis.  HPI     Home Medications Prior to Admission medications   Medication Sig Start Date End Date Taking? Authorizing Provider  acetaminophen (TYLENOL) 500 MG tablet Take 500 mg by mouth daily as needed for moderate pain or headache.    [provider]  amoxicillin-clavulanate (AUGMENTIN) 875-125 MG tablet Take 1 tablet by mouth every 12 (twelve) hours for 7 days. 09/16/22 09/23/22  Tyrone Nine, MD  apixaban (ELIQUIS) 2.5 MG TABS tablet TAKE (1) TABLET TWICE DAILY. Patient taking differently: Take 2.5 mg by mouth 2 (two) times daily. 04/23/22   Little Ishikawa, MD  carvedilol (COREG) 12.5 MG tablet TAKE (1) TABLET BY MOUTH TWICE DAILY WITH MEALS. Patient taking differently: Take 12.5 mg by mouth 2 (two) times daily with a meal. 04/09/22   Dyann Kief, PA-C  cholecalciferol (VITAMIN D3) 25 MCG (1000 UT) tablet Take 1,000 Units by mouth daily.    [provider]  doxycycline (VIBRA-TABS) 100 MG tablet Take 1 tablet (100 mg total) by mouth every 12 (twelve) hours for 7 days. 09/16/22 09/23/22  Tyrone Nine, MD  FARXIGA 10 MG TABS tablet Take 10 mg by mouth daily. Take 1 tablet by mouth every day 03/06/20   [provider]  lisinopril (ZESTRIL) 20 MG tablet TAKE (1) TABLET BY MOUTH ONCE DAILY. Patient taking differently: Take 20 mg by mouth daily. 11/21/21   Little Ishikawa, MD  metFORMIN (GLUCOPHAGE) 500 MG tablet Take 1 tablet (500 mg total) by mouth 2 (two) times daily. 01/16/19   [place], Peter M, MD  polyvinyl alcohol (LIQUIFILM TEARS) 1.4 % ophthalmic solution Apply to eye. 09/29/12   [provider]  rosuvastatin (CRESTOR) 10 MG tablet Take 10 mg by mouth daily.  08/17/12   [provider]  spironolactone (ALDACTONE) 25 MG tablet TAKE 1/2 TABLET BY MOUTH DAILY. 10/28/21   Little Ishikawa, MD  zinc gluconate 50 MG tablet Take 50 mg by mouth daily.    [provider]      Allergies    [name] allergy]    Review of Systems   Review of Systems  Constitutional:  Positive for appetite change.  HENT: Negative.    Respiratory: Negative.    Cardiovascular: Negative.   Gastrointestinal:  Positive for abdominal pain, constipation and nausea. Negative for vomiting.  Genitourinary: Negative.   Musculoskeletal: Negative.     Physical Exam Updated  Vital Signs BP 108/62 (BP Location: Right Arm)   Pulse 72   Temp 98.2 F (36.8 C) (Oral)   Resp 16   Ht 5\' 10"  (1.778 m)   Wt 56.9 kg   SpO2 96%   BMI 18.00 kg/m  Physical Exam Vitals and nursing note reviewed.  Constitutional:      Appearance: He is not ill-appearing or toxic-appearing.  HENT:     Head: Normocephalic and atraumatic.     Mouth/Throat:     Mouth: Mucous membranes are moist.     Pharynx: No oropharyngeal exudate or posterior oropharyngeal erythema.  Eyes:     General:        Right eye: No discharge.        Left  eye: No discharge.     Extraocular Movements: Extraocular movements intact.     Conjunctiva/sclera: Conjunctivae normal.     Pupils: Pupils are equal, round, and reactive to light.  Cardiovascular:     Rate and Rhythm: Normal rate and regular rhythm.     Pulses: Normal pulses.     Heart sounds: Normal heart sounds. No murmur heard. Pulmonary:     Effort: Pulmonary effort is normal. No tachypnea, bradypnea, accessory muscle usage, prolonged expiration or respiratory distress.     Breath sounds: Normal breath sounds. No wheezing or rales.  Abdominal:     General: Bowel sounds are normal. There is distension.     Palpations: Abdomen is soft.     Tenderness: There is generalized abdominal tenderness. There is no guarding or rebound.  Musculoskeletal:        General: No deformity.     Cervical back: Neck supple.     Right lower leg: No edema.     Left lower leg: No edema.  Skin:    General: Skin is warm and dry.     Capillary Refill: Capillary refill takes less than 2 seconds.  Neurological:     General: No focal deficit present.     Mental Status: He is alert and oriented to person, place, and time. Mental status is at baseline.  Psychiatric:        Mood and Affect: Mood normal.     ED Results / Procedures / Treatments   Labs (all labs ordered are listed, but only abnormal results are displayed) Labs Reviewed - No data to display  EKG None  Radiology DG Lumbar Spine Complete  Result Date: 09/18/2022 CLINICAL DATA:  injury EXAM: LUMBAR SPINE - COMPLETE 4+ VIEW COMPARISON:  CT chest abdomen and pelvis 09/14/2022. FINDINGS: When accounting for obliquity, no evidence of progressive height loss in comparison to CT from 09/14/2022. Mild broad rotatory dextrocurvature. No substantial sagittal subluxation. Multilevel degenerative change. Osteopenia. IMPRESSION: 1. No evidence of acute fracture or traumatic malalignment. Cross-sectional imaging could provide more sensitive evaluation if  clinically warranted. 2. Moderate multilevel degenerative change. 3. Osteopenia. Electronically Signed   By: Feliberto Harts M.D.   On: 09/18/2022 19:21    Procedures Procedures    Medications Ordered in ED Medications - No data to display  ED Course/ Medical Decision Making/ A&P                             Medical Decision Making 82 year old male presents to hospital with abdominal pain and constipation.  Vital signs normal intake.  Pulmonary sounds normal, abdominal exam with mild distention, dullness to percussion, distention and generalized tenderness palpation without rebound or  guarding.  Hypo-active bowel sounds.  DDx for constipation and abdominal pain includes but is not limited to obstruction, ileus, constipation, volvulus.  Amount and/or Complexity of Data Reviewed Labs: ordered.    Details: CBC with mild anemia with hemoglobin of 12.8 near patient's baseline. Radiology: ordered.    Details: CT scan without acute intra-abdominal abnormality.  Moderate constipation.  New T12 superior endplate fracture since last scan (patient did have a fall while inpatient)  Risk Prescription drug management.   Patient neurologically intact.  Soapsuds enema with significant release of stool.  Patient feeling much improved and his abdomen.  Patient feeling well at this time and requesting discharge.  Tolerating p.o.  Clinical concern for emergent underlying etiology for this patient's constipation that would warrant further ED workup or inpatient management is exceedingly low.  Ron and his wife  voiced understanding of his medical evaluation and treatment plan. Each of their questions answered to their expressed satisfaction.  Return precautions were given.  Patient is well-appearing, stable, and was discharged in good condition.  This chart was dictated using voice recognition software, Dragon. Despite the best efforts of this provider to proofread and correct errors, errors may still  occur which can change documentation meaning.          Final Clinical Impression(s) / ED Diagnoses Final diagnoses:  None    Rx / DC Orders ED Discharge Orders     None         Paris Lore, PA-C 09/20/22 1701    Rexford Maus, DO 09/21/22 [phone]

## 2022-09-20 NOTE — ED Notes (Signed)
Transported to ct via wheelchair

## 2022-09-20 NOTE — ED Notes (Signed)
Dc instructions reviewed with patient. Patient voiced understanding. Dc with belongings.  °

## 2022-09-20 NOTE — ED Triage Notes (Signed)
States D/C from hospital 7 days ago.  Has not had BM.

## 2022-09-20 NOTE — ED Notes (Signed)
Pt assisted to left side recumbent, 500 cc soap suds enema instilled into rectum. Pt able to hold the enema. Await results.

## 2022-09-20 NOTE — Discharge Instructions (Signed)
You may utilize MiraLAX daily to keep your stools soft and easy to pass.  Follow-up with your primary care doctor for further management constipation return to the ER with any severe symptoms.

## 2022-09-20 NOTE — ED Notes (Signed)
Pt had large amount of watery return after enema. There were small amounts of formed stool. Pt sitting on bsc attempting to have further BM.

## 2022-09-23 ENCOUNTER — Telehealth: Payer: Self-pay | Admitting: *Deleted

## 2022-09-23 NOTE — Telephone Encounter (Signed)
Transition Care Management Unsuccessful Follow-up Telephone Call  Date of discharge and from where:  [facility] 09/20/2022  Attempts:  1st Attempt  Reason for unsuccessful TCM follow-up call:  Left voice message

## 2022-09-24 ENCOUNTER — Telehealth: Payer: Self-pay | Admitting: *Deleted

## 2022-09-24 NOTE — Telephone Encounter (Signed)
Transition Care Management Unsuccessful Follow-up Telephone Call  Date of discharge and from where:  [facility] ed 09/20/2022  Attempts:  2nd Attempt  Reason for unsuccessful TCM follow-up call:  Left voice message

## 2022-09-25 DIAGNOSIS — E1169 Type 2 diabetes mellitus with other specified complication: Secondary | ICD-10-CM | POA: Diagnosis not present

## 2022-09-25 DIAGNOSIS — I10 Essential (primary) hypertension: Secondary | ICD-10-CM | POA: Diagnosis not present

## 2022-09-25 DIAGNOSIS — R6510 Systemic inflammatory response syndrome (SIRS) of non-infectious origin without acute organ dysfunction: Secondary | ICD-10-CM | POA: Diagnosis not present

## 2022-09-25 DIAGNOSIS — I480 Paroxysmal atrial fibrillation: Secondary | ICD-10-CM | POA: Diagnosis not present

## 2022-09-25 DIAGNOSIS — I441 Atrioventricular block, second degree: Secondary | ICD-10-CM | POA: Diagnosis not present

## 2022-09-25 DIAGNOSIS — I420 Dilated cardiomyopathy: Secondary | ICD-10-CM | POA: Diagnosis not present

## 2022-09-25 DIAGNOSIS — I5042 Chronic combined systolic (congestive) and diastolic (congestive) heart failure: Secondary | ICD-10-CM | POA: Diagnosis not present

## 2022-09-25 DIAGNOSIS — E871 Hypo-osmolality and hyponatremia: Secondary | ICD-10-CM | POA: Diagnosis not present

## 2022-09-25 DIAGNOSIS — E785 Hyperlipidemia, unspecified: Secondary | ICD-10-CM | POA: Diagnosis not present

## 2022-09-25 DIAGNOSIS — I110 Hypertensive heart disease with heart failure: Secondary | ICD-10-CM | POA: Diagnosis not present

## 2022-09-25 DIAGNOSIS — E875 Hyperkalemia: Secondary | ICD-10-CM | POA: Diagnosis not present

## 2022-10-15 DIAGNOSIS — Z125 Encounter for screening for malignant neoplasm of prostate: Secondary | ICD-10-CM | POA: Diagnosis not present

## 2022-10-15 DIAGNOSIS — E1169 Type 2 diabetes mellitus with other specified complication: Secondary | ICD-10-CM | POA: Diagnosis not present

## 2022-10-15 DIAGNOSIS — E782 Mixed hyperlipidemia: Secondary | ICD-10-CM | POA: Diagnosis not present

## 2022-10-15 DIAGNOSIS — E559 Vitamin D deficiency, unspecified: Secondary | ICD-10-CM | POA: Diagnosis not present

## 2022-10-16 LAB — LAB REPORT - SCANNED
A1c: 7.1
Albumin, Urine POC: 7.4
Creatinine, POC: 71.2 mg/dL
EGFR: 90
Microalb Creat Ratio: 10

## 2022-10-17 ENCOUNTER — Telehealth: Payer: Self-pay | Admitting: Cardiology

## 2022-10-17 MED ORDER — CARVEDILOL 12.5 MG PO TABS: 6.2500 mg | ORAL_TABLET | Freq: Two times a day (BID) | ORAL | Status: DC

## 2022-10-17 NOTE — Telephone Encounter (Signed)
Pt c/o BP issue: STAT if pt c/o blurred vision, one-sided weakness or slurred speech  1. What are your last 5 BP readings?  8/16: 133/77 (w/o medication) 8/15: 113/72 101/65  107/69  2. Are you having any other symptoms (ex. Dizziness, headache, blurred vision, passed out)?  Weakness   3. What is your BP issue?   Patient states his BP has been very low.

## 2022-10-17 NOTE — Telephone Encounter (Signed)
Patient states he can't get his "exercise in to keep myself above ground".  States it is low due to the medication, Coreg BID .  States did not take it last night or this morning and BP readings have increased.  He states feeling better and more energy.  He states he had several falls and feels more steady.  The readings yesterday were with the medication.  The BP today is without the medication.   Advised OK to hold  while waiting to hear from provider ,unless he sees his BP starting to increase.

## 2022-10-17 NOTE — Telephone Encounter (Signed)
Recommend decreasing carvedilol dose from 12.5 mg twice daily down to 6.25 mg twice daily and would check BP twice daily for next week and let us know results

## 2022-10-17 NOTE — Telephone Encounter (Signed)
Spoke with pt, aware of the recommendations.  

## 2022-10-21 ENCOUNTER — Encounter: Payer: Self-pay | Admitting: Internal Medicine

## 2022-10-21 DIAGNOSIS — E782 Mixed hyperlipidemia: Secondary | ICD-10-CM | POA: Diagnosis not present

## 2022-10-21 DIAGNOSIS — E119 Type 2 diabetes mellitus without complications: Secondary | ICD-10-CM | POA: Diagnosis not present

## 2022-10-21 DIAGNOSIS — E875 Hyperkalemia: Secondary | ICD-10-CM | POA: Diagnosis not present

## 2022-10-21 DIAGNOSIS — I441 Atrioventricular block, second degree: Secondary | ICD-10-CM | POA: Diagnosis not present

## 2022-10-21 DIAGNOSIS — E871 Hypo-osmolality and hyponatremia: Secondary | ICD-10-CM | POA: Diagnosis not present

## 2022-10-21 DIAGNOSIS — I420 Dilated cardiomyopathy: Secondary | ICD-10-CM | POA: Diagnosis not present

## 2022-10-21 DIAGNOSIS — I10 Essential (primary) hypertension: Secondary | ICD-10-CM | POA: Diagnosis not present

## 2022-10-21 DIAGNOSIS — E1169 Type 2 diabetes mellitus with other specified complication: Secondary | ICD-10-CM | POA: Diagnosis not present

## 2022-10-21 DIAGNOSIS — I480 Paroxysmal atrial fibrillation: Secondary | ICD-10-CM | POA: Diagnosis not present

## 2022-10-21 DIAGNOSIS — I110 Hypertensive heart disease with heart failure: Secondary | ICD-10-CM | POA: Diagnosis not present

## 2022-10-21 DIAGNOSIS — I5042 Chronic combined systolic (congestive) and diastolic (congestive) heart failure: Secondary | ICD-10-CM | POA: Diagnosis not present

## 2022-10-29 ENCOUNTER — Other Ambulatory Visit: Payer: Self-pay | Admitting: Adult Health

## 2022-10-31 ENCOUNTER — Ambulatory Visit: Payer: Medicare Other

## 2022-10-31 DIAGNOSIS — I428 Other cardiomyopathies: Secondary | ICD-10-CM

## 2022-10-31 LAB — CUP PACEART REMOTE DEVICE CHECK
Battery Remaining Longevity: 54 mo
Battery Remaining Percentage: 57 %
Battery Voltage: 2.95 V
Brady Statistic AP VP Percent: 9.1 %
Brady Statistic AP VS Percent: 1 %
Brady Statistic AS VP Percent: 91 %
Brady Statistic AS VS Percent: 1 %
Brady Statistic RA Percent Paced: 8.9 %
Date Time Interrogation Session: 20240830020639
HighPow Impedance: 69 Ohm
Implantable Lead Connection Status: 753985
Implantable Lead Connection Status: 753985
Implantable Lead Connection Status: 753985
Implantable Lead Implant Date: 20210305
Implantable Lead Implant Date: 20210305
Implantable Lead Implant Date: 20210305
Implantable Lead Location: 753858
Implantable Lead Location: 753859
Implantable Lead Location: 753860
Implantable Pulse Generator Implant Date: 20210305
Lead Channel Impedance Value: 1050 Ohm
Lead Channel Impedance Value: 480 Ohm
Lead Channel Impedance Value: 500 Ohm
Lead Channel Pacing Threshold Amplitude: 0.75 V
Lead Channel Pacing Threshold Amplitude: 0.875 V
Lead Channel Pacing Threshold Amplitude: 2 V
Lead Channel Pacing Threshold Pulse Width: 0.5 ms
Lead Channel Pacing Threshold Pulse Width: 0.5 ms
Lead Channel Pacing Threshold Pulse Width: 0.8 ms
Lead Channel Sensing Intrinsic Amplitude: 12 mV
Lead Channel Sensing Intrinsic Amplitude: 2.2 mV
Lead Channel Setting Pacing Amplitude: 1.75 V
Lead Channel Setting Pacing Amplitude: 1.875
Lead Channel Setting Pacing Amplitude: 2.5 V
Lead Channel Setting Pacing Pulse Width: 0.5 ms
Lead Channel Setting Pacing Pulse Width: 0.8 ms
Lead Channel Setting Sensing Sensitivity: 0.5 mV
Pulse Gen Serial Number: 111018177
Zone Setting Status: 755011

## 2022-11-04 NOTE — Progress Notes (Signed)
Remote ICD transmission.   

## 2022-11-13 ENCOUNTER — Encounter: Payer: Self-pay | Admitting: Cardiology

## 2022-11-13 ENCOUNTER — Ambulatory Visit: Payer: Medicare Other | Attending: Cardiology | Admitting: Cardiology

## 2022-11-13 VITALS — BP 124/76 | HR 63 | Ht 69.5 in | Wt 123.4 lb

## 2022-11-13 DIAGNOSIS — I441 Atrioventricular block, second degree: Secondary | ICD-10-CM | POA: Diagnosis not present

## 2022-11-13 DIAGNOSIS — I428 Other cardiomyopathies: Secondary | ICD-10-CM | POA: Diagnosis present

## 2022-11-13 DIAGNOSIS — I480 Paroxysmal atrial fibrillation: Secondary | ICD-10-CM | POA: Insufficient documentation

## 2022-11-13 DIAGNOSIS — E785 Hyperlipidemia, unspecified: Secondary | ICD-10-CM | POA: Diagnosis not present

## 2022-11-13 DIAGNOSIS — I5042 Chronic combined systolic (congestive) and diastolic (congestive) heart failure: Secondary | ICD-10-CM | POA: Diagnosis present

## 2022-11-13 NOTE — Patient Instructions (Signed)
Medication Instructions:  Your physician recommends that you continue on your current medications as directed. Please refer to the Current Medication list given to you today.  *If you need a refill on your cardiac medications before your next appointment, please call your pharmacy*  Lab Work: BMET, MAG-TODAY If you have labs (blood work) drawn today and your tests are completely normal, you will receive your results only by: MyChart Message (if you have MyChart) OR A paper copy in the mail If you have any lab test that is abnormal or we need to change your treatment, we will call you to review the results.  Follow-Up: At [facility], you and your health needs are our priority.  As part of our continuing mission to provide you with exceptional heart care, we have created designated Provider Care Teams.  These Care Teams include your primary Cardiologist (physician) and Advanced Practice Providers (APPs -  Physician Assistants and Nurse Practitioners) who all work together to provide you with the care you need, when you need it.  Your next appointment:   6 month(s)  Provider:   Little Ishikawa, MD

## 2022-11-13 NOTE — Progress Notes (Signed)
Cardiology Office Note:    Date:  11/13/2022   ID:  Richard Martin, DOB [birth year], MRN [id number]  PCP:  Benita Stabile, MD  Cardiologist:  Little Ishikawa, MD  Electrophysiologist:  Regan Lemming, MD   Referring MD: Benita Stabile, MD   Chief Complaint  Patient presents with   Congestive Heart Failure    History of Present Illness:    Richard Martin is a 82 y.o. male with a hx of diabetes, chronic combined systolic and diastolic heart failure and second-degree Mobitz 2 AV block presents for follow-up.  He was initially seen on 12/31/2018 after being referred by Dr. Margo Aye for evaluation of EKG abnormalities and syncopal episode.  He had a episode of syncope where he was walking to his kitchen and had sudden loss of consciousness.  He was seen in clinic on 12/31/2018, and rhythm strip demonstrated intermittent 2:1 AV block, with a wide QRS.  He was referred to EP for evaluation for pacemaker, and an echocardiogram was ordered.  TTE was done on 01/04/2019, which showed EF 40 to 45%, with aneurysmal basal inferior segment.  TTE was also notable for grade 2 diastolic dysfunction, normal RV function, no significant valvular disease.  Given new systolic dysfunction, cardiac catheterization was done on 01/13/2019, which showed normal coronary arteries.  Cardiac MRI was done on 02/11/2019, which showed EF 35%, basal inferior aneurysm, transmural basal inferior LGE, basal inferoseptal mid wall LGE.  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  Zio patch x14 days was completed, which showed frequent second-degree Mobitz 2 AV block, with symptoms of dizziness corresponding to Mobitz 2 AV block.  He underwent cardiac PET at [facility] on 04/01/2019, which showed no active myocardial inflammation.  He underwent BiV ICD placement by Dr. Elberta Fortis on 05/06/2019.  Since CRT-D placed, reports lightheadedness/syncope has resolved and dyspnea/fatigue significantly improved.  Echocardiogram on 08/13/2020 showed EF 40  to 45%, inferior basal aneurysm, normal RV function, no significant valvular disease.  Echocardiogram 06/12/2022 showed EF 50 to 55%, normal RV function, RVSP 46, moderate TR.  Since last clinic visit, he reports he is doing well.  Was having some lightheadedness with standing, improved with decreasing his Coreg.  He had an admission in July with SIRS, source unknown, was discharged on doxycycline and Augmentin.  Denies any chest pain, dyspnea, lower extremity edema, or palpitations.  No bleeding on Eliquis.   Wt Readings from Last 3 Encounters:  11/13/22 123 lb 6.4 oz (56 kg)  09/20/22 125 lb 7.1 oz (56.9 kg)  09/18/22 125 lb 7.1 oz (56.9 kg)   BP Readings from Last 3 Encounters:  11/13/22 124/76  09/20/22 123/74  09/18/22 128/76    Past Medical History:  Diagnosis Date   Cardiac resynchronization therapy defibrillator (CRT-D) in place    a. 05/2019 s/p SJM Gallant HF CRT-D [id number] (ser# [id number]).   Chronic combined systolic (congestive) and diastolic (congestive) heart failure (HCC)    a. 01/2019 Echo: EF 40-45%, focal basal inf aneurysmal region. Gr2 DD. Nl RV fxn. Mildly dil RA. Mod TR. Triv PR; b. 02/2019 cMRI: EF 35%.   Diabetes mellitus without complication (HCC)    LBBB (left bundle branch block)    NICM (nonischemic cardiomyopathy) (HCC)    a. 01/2019 Echo: EF 40-45%; b. 01/2019 Cath: nl cors. EF 35-45%; c. 02/2019 cMRI: Basal inf and basal infsept midwall LGE. EF 35%; d. 05/2019 s/p SJM Gallant HF CRT-D [id number] (ser# [id number]).    Past Surgical History:  Procedure Laterality  Date   BIV ICD INSERTION CRT-D N/A 05/06/2019   Procedure: BIV ICD INSERTION CRT-D;  Surgeon: Regan Lemming, MD;  Location: [facility] INVASIVE CV LAB;  Service: Cardiovascular;  Laterality: N/A;   LEFT HEART CATH AND CORONARY ANGIOGRAPHY N/A 01/13/2019   Procedure: LEFT HEART CATH AND CORONARY ANGIOGRAPHY;  Surgeon: [place], Peter M, MD;  Location: [facility] INVASIVE CV LAB;  Service: Cardiovascular;   Laterality: N/A;    Current Medications: Current Meds  Medication Sig   acetaminophen (TYLENOL) 500 MG tablet Take 500 mg by mouth daily as needed for moderate pain or headache.   apixaban (ELIQUIS) 2.5 MG TABS tablet TAKE (1) TABLET TWICE DAILY. (Patient taking differently: Take 2.5 mg by mouth 2 (two) times daily.)   carvedilol (COREG) 12.5 MG tablet Take 0.5 tablets (6.25 mg total) by mouth 2 (two) times daily with a meal.   cholecalciferol (VITAMIN D3) 25 MCG (1000 UT) tablet Take 1,000 Units by mouth daily.   FARXIGA 10 MG TABS tablet Take 10 mg by mouth daily. Take 1 tablet by mouth every day   lisinopril (ZESTRIL) 20 MG tablet TAKE (1) TABLET BY MOUTH ONCE DAILY. (Patient taking differently: Take 20 mg by mouth daily.)   metFORMIN (GLUCOPHAGE) 500 MG tablet Take 1 tablet (500 mg total) by mouth 2 (two) times daily.   polyvinyl alcohol (LIQUIFILM TEARS) 1.4 % ophthalmic solution Apply to eye.   rosuvastatin (CRESTOR) 10 MG tablet Take 10 mg by mouth daily.    spironolactone (ALDACTONE) 25 MG tablet TAKE 1/2 TABLET BY MOUTH DAILY.   zinc gluconate 50 MG tablet Take 50 mg by mouth daily.     Allergies:   [name] allergy]   Social History   Socioeconomic History   Marital status: Married    Spouse name: Not on file   Number of children: Not on file   Years of education: Not on file   Highest education level: Not on file  Occupational History   Not on file  Tobacco Use   Smoking status: Former    Current packs/day: 0.00    Types: Cigarettes    Quit date: 2002    Years since quitting: 22.7   Smokeless tobacco: Never   Tobacco comments:    Quit 2002  Substance and Sexual Activity   Alcohol use: No   Drug use: No   Sexual activity: Not on file  Other Topics Concern   Not on file  Social History Narrative   Not on file   Social Determinants of Health   Financial Resource Strain: Not on file  Food Insecurity: No Food Insecurity (09/15/2022)   Hunger Vital Sign     Worried About Running Out of Food in the Last Year: Never true    Ran Out of Food in the Last Year: Never true  Transportation Needs: No Transportation Needs (09/15/2022)   PRAPARE - Administrator, Civil Service (Medical): No    Lack of Transportation (Non-Medical): No  Physical Activity: Not on file  Stress: Not on file  Social Connections: Not on file     Family History: Father died of MI at age 68  ROS:   Please see the history of present illness.    All other systems reviewed and are negative.  EKGs/Labs/Other Studies Reviewed:    The following studies were reviewed today:   EKG:   03/05/21: Sinus rhythm, BiV paced, rate 72 05/12/22: atrial sensed, BiV paced, rate 79    TTE 01/04/19:  1. Left ventricular ejection fraction, by visual estimation, is 40 to 45%. The left ventricle has normal function. There is no left ventricular hypertrophy.  2. Basal inferior segment is abnormal.  3. Left ventricular diastolic parameters are consistent with Grade II diastolic dysfunction (pseudonormalization).  4. Global right ventricle has normal systolic function.The right ventricular size is mildly enlarged. No increase in right ventricular wall thickness.  5. Left atrial size was normal.  6. Right atrial size was mildly dilated.  7. The mitral valve is normal in structure. No evidence of mitral valve regurgitation. No evidence of mitral stenosis.  8. The tricuspid valve is normal in structure. Tricuspid valve regurgitation moderate.  9. The aortic valve is normal in structure. Aortic valve regurgitation is not visualized. No evidence of aortic valve sclerosis or stenosis. 10. The pulmonic valve was normal in structure. Pulmonic valve regurgitation is trivial. 11. Mildly elevated pulmonary artery systolic pressure. 12. The inferior vena cava is normal in size with greater than 50% respiratory variability, suggesting right atrial pressure of 3 mmHg. 13. Focal basal inferior  aneursymal region/infarct. 14. There is redundancy of the interatrial septum.  LHC 01/13/19: There is moderate left ventricular systolic dysfunction. LV end diastolic pressure is normal. The left ventricular ejection fraction is 35-45% by visual estimate.   1. Normal coronary anatomy 2. Moderate LV dysfunction 3. inferobasal aneurysm 4. Normal LVEDP   CMR 02/11/19: 1. 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 While transmural LGE suggests ischemic etiology, it is not in a coronary distribution and patient had normal coronaries on recent cath. 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 2. Mild LV dilatation with moderate systolic dysfunction (EF 35%). Basal inferior aneurysm 3.  Normal RV size and systolic function (EF 62%)  Cardiac monitor 02/13/19: Frequent second degree Mobitz II AV block. Symptoms of dizziness corresponded to Mobitz II AV block   14 days of data recorded on Zio monitor. Patient had a min HR of 29 bpm, max HR of 123 bpm, and avg HR of 69 bpm. Predominant underlying rhythm was Sinus Rhythm.  No VT, SVT, atrial fibrillation, or pauses noted. Isolated atrial and ventricular ectopy was rare (<1%).  8523 episodes of second degree Mobitz II AV block.  Patient triggered events, with symptoms reported of dizziness, corresponded to Mobitz II AV block.    Cardiac PET 04/01/19:  Scattered bilateral pulmonary nodules, largest in the right lower lobe measuring up to 1.1 cm with minimal metabolic activity,  which may represent atelectasis; recommend follow up chest CT in 3-6 months to assess for resolution.  FINAL COMMENTS  Rb-82 Cardiac PET/CT Myocardial Perfusion Imaging Study:   Abnormal rest perfusion in 4 out of 17 segments of the myocardium in a non "vascular" territory. This is consistent with  "scar/fibrosis" of the myocardium  due to prior inflamation/infiltrative disease. It is not coronary artery disease related.   Rb-82 Cardiac PET/CT Functonal Imaging Study:   Sever decreased in left ventricular function. Dilated left ventricle. These findings are compatible with a nonischemic  cardiomyopathy.   F-18 FDG Cardiac PET/CT Myocardial Metabolic Study:   No evidence of active inflamation of the myocardium. No evidence of active inflamatory process of the myocardium to suggest  active sarcoid or active myocarditis.   Limited F-18 FDG PET/CT of the Chest and Abdomen:   Note that evaluation is somewhat limited due to lack of IV contrast and  slice thickness, within this limitation:   The thyroid is normal in appearance.   No mediastinal, hilar, or axillary lymphadenopathy. There are tiny mediastinal lymph nodes which do not meet size criteria for  pathologic enlargement. There are severe coronary artery and moderate thoracic aortic calcifications. No pericardial effusion.  The esophagus is normal in appearance.   No pleural effusion. No pneumothorax. Severe bilateral centrilobular and paraseptal emphysema. There are basal predominant  subpleural reticulations. There are scattered bilateral pulmonary nodules measuring up to 1.1 cm, for example:   Nodule #1: Right upper lobe nodule measuring 6 mm (series 3, image 80).  Nodule #2: Left upper lobe pulmonary nodule measuring 5 mm (series 3, image 77).  Nodule #3: Right lower lobe subpleural nodule measuring 1.1 cm (series 3, image 110) (SUV max 1.4).   Liver has a normal contour. Multifocal hypodense liver lesions without significant metabolic activity, likely cysts. Gallbladder,  pancreas, and bilateral adrenal glands are normal in appearance. Punctate splenic calcifications, likely sequela of granulomatous  disease.   Bilateral kidneys are normal in size without evidence of hydronephrosis. Right sided nephrolithiasis.   Visualized stomach, small bowel, large  bowel is normal in caliber. No evidence of focal wall thickening.   Visualized abdominal aorta is normal in caliber. No suspicious osseous lesions.  Recent Labs: 09/15/2022: Magnesium 1.9 09/20/2022: ALT 41; BUN 23; Creatinine, Ser 0.60; Hemoglobin 12.8; Platelets 247; Potassium 4.0; Sodium 139  Recent Lipid Panel    Component Value Date/Time   CHOL 123 01/10/2019 1027   TRIG 66 01/10/2019 1027   HDL 52 01/10/2019 1027   CHOLHDL 2.4 01/10/2019 1027   LDLCALC 57 01/10/2019 1027    Physical Exam:    VS:  BP 124/76   Pulse 63   Ht 5' 9.5" (1.765 m)   Wt 123 lb 6.4 oz (56 kg)   SpO2 92%   BMI 17.96 kg/m     Wt Readings from Last 3 Encounters:  11/13/22 123 lb 6.4 oz (56 kg)  09/20/22 125 lb 7.1 oz (56.9 kg)  09/18/22 125 lb 7.1 oz (56.9 kg)     GEN:  Well nourished, well developed in no acute distress HEENT: Normal NECK: No JVD CARDIAC: RRR, no murmurs, rubs, gallops RESPIRATORY:  Clear to auscultation without rales, wheezing or rhonchi  ABDOMEN: Soft, non-tender, non-distended MUSCULOSKELETAL:  No edema SKIN: Warm and dry NEUROLOGIC:  Alert and oriented x 3 PSYCHIATRIC:  Normal affect   ASSESSMENT:    1. Chronic combined systolic and diastolic CHF (congestive heart failure) (HCC)   2. NICM (nonischemic cardiomyopathy) (HCC)   3. Second degree Mobitz II AV block   4. Paroxysmal atrial fibrillation (HCC)   5. Hyperlipidemia, unspecified hyperlipidemia type      PLAN:    Chronic combined systolic and diastolic heart failure: Normal coronary arteries on cath.  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  No evidence of active myocardial inflammation on cardiac PET at [facility] on 04/01/2019.  Status post CRT-D placement by Dr. Elberta Fortis on 05/06/2019.  Echocardiogram on 08/13/2020 showed EF 40 to 45%, inferior basal aneurysm, normal RV function, no significant valvular disease.  Echocardiogram 06/12/2022 showed EF 50 to 55%,  normal RV function, RVSP 46, moderate TR. - Continue lisinopril 20 mg daily.  Has not been on Entresto due to hyperkalemia - Continue carvedilol 6.25 mg twice daily - Continue Farxiga 10 mg daily - Continue spironolactone 12.5 mg daily - Follows with EP for ICD  management - Check BMP, magnesium  Paroxysmal atrial fibrillation: Noted on device interrogation, most recent interrogation 10/2022 showed burden less than 1%. CHA2DS2-VASc score 5 (CHF, hypertension, age x2, T2DM) -Continue Eliquis 2.5 mg twice daily (reduced dose given age 26 and weight 60 kg).   -Continue Coreg 6.25 mg twice daily   Second-degree Mobitz 2 AV block: Presented with syncope.  Zio patch confirmed frequent episodes of second-degree Mobitz 2 AV block with symptoms of dizziness corresponding to periods of Mobitz 2. S/p CRT-D on 05/06/19   Hyperlipidemia: On rosuvastatin 10 mg daily.  LDL 83 on 10/15/22   Type 2 diabetes: A1c 7.4% on 02/11/2022.  On Metformin and Farxiga  Pulmonary nodules/COPD: Scattered bilateral nodules noted on PET/CT at [facility] [place], recommended chest CT follow-up in 3 to 6 months.  CT chest 07/07/2019 showed several small pulmonary nodules, along with moderate to severe emphysema.  Referred to pulmonology for further evaluation, had PFTs which showed mild emphysema.  CT chest 01/2021 showed stable pulmonary nodules   RTC in 6 months    Medication Adjustments/Labs and Tests Ordered: Current medicines are reviewed at length with the patient today.  Concerns regarding medicines are outlined above.  Orders Placed This Encounter  Procedures   Basic metabolic panel   Magnesium   No orders of the defined types were placed in this encounter.   Patient Instructions  Medication Instructions:  Your physician recommends that you continue on your current medications as directed. Please refer to the Current Medication list given to you today.  *If you need a refill on your cardiac medications before your next  appointment, please call your pharmacy*  Lab Work: BMET, MAG-TODAY If you have labs (blood work) drawn today and your tests are completely normal, you will receive your results only by: MyChart Message (if you have MyChart) OR A paper copy in the mail If you have any lab test that is abnormal or we need to change your treatment, we will call you to review the results.  Follow-Up: At [facility], you and your health needs are our priority.  As part of our continuing mission to provide you with exceptional heart care, we have created designated Provider Care Teams.  These Care Teams include your primary Cardiologist (physician) and Advanced Practice Providers (APPs -  Physician Assistants and Nurse Practitioners) who all work together to provide you with the care you need, when you need it.  Your next appointment:   6 month(s)  Provider:   Little Ishikawa, MD        Signed, Little Ishikawa, MD  11/13/2022 10:52 AM    [facility]

## 2022-11-14 LAB — BASIC METABOLIC PANEL
BUN/Creatinine Ratio: 29 — ABNORMAL HIGH (ref 10–24)
BUN: 19 mg/dL (ref 8–27)
CO2: 26 mmol/L (ref 20–29)
Calcium: 10 mg/dL (ref 8.6–10.2)
Chloride: 99 mmol/L (ref 96–106)
Creatinine, Ser: 0.65 mg/dL — ABNORMAL LOW (ref 0.76–1.27)
Glucose: 138 mg/dL — ABNORMAL HIGH (ref 70–99)
Potassium: 5.3 mmol/L — ABNORMAL HIGH (ref 3.5–5.2)
Sodium: 139 mmol/L (ref 134–144)
eGFR: 95 mL/min/{1.73_m2} (ref >59)

## 2022-11-14 LAB — MAGNESIUM: Magnesium: 2.1 mg/dL (ref 1.6–2.3)

## 2022-11-17 ENCOUNTER — Other Ambulatory Visit: Payer: Self-pay

## 2022-11-17 DIAGNOSIS — E876 Hypokalemia: Secondary | ICD-10-CM

## 2022-11-20 DIAGNOSIS — E876 Hypokalemia: Secondary | ICD-10-CM | POA: Diagnosis not present

## 2022-11-21 LAB — BASIC METABOLIC PANEL
BUN/Creatinine Ratio: 24 (ref 10–24)
BUN: 17 mg/dL (ref 8–27)
CO2: 24 mmol/L (ref 20–29)
Calcium: 10.3 mg/dL — ABNORMAL HIGH (ref 8.6–10.2)
Chloride: 97 mmol/L (ref 96–106)
Creatinine, Ser: 0.72 mg/dL — ABNORMAL LOW (ref 0.76–1.27)
Glucose: 160 mg/dL — ABNORMAL HIGH (ref 70–99)
Potassium: 4.8 mmol/L (ref 3.5–5.2)
Sodium: 139 mmol/L (ref 134–144)
eGFR: 92 mL/min/{1.73_m2} (ref >59)

## 2022-11-25 ENCOUNTER — Other Ambulatory Visit: Payer: Self-pay | Admitting: Cardiology

## 2023-01-02 DIAGNOSIS — Z23 Encounter for immunization: Secondary | ICD-10-CM | POA: Diagnosis not present

## 2023-01-30 ENCOUNTER — Ambulatory Visit (INDEPENDENT_AMBULATORY_CARE_PROVIDER_SITE_OTHER): Payer: Medicare Other

## 2023-01-30 DIAGNOSIS — I441 Atrioventricular block, second degree: Secondary | ICD-10-CM

## 2023-01-30 LAB — CUP PACEART REMOTE DEVICE CHECK
Battery Remaining Longevity: 50 mo
Battery Remaining Percentage: 53 %
Battery Voltage: 2.95 V
Brady Statistic AP VP Percent: 8.4 %
Brady Statistic AP VS Percent: 1 %
Brady Statistic AS VP Percent: 91 %
Brady Statistic AS VS Percent: 1 %
Brady Statistic RA Percent Paced: 8.2 %
Date Time Interrogation Session: 20241129010207
HighPow Impedance: 69 Ohm
Implantable Lead Connection Status: 753985
Implantable Lead Connection Status: 753985
Implantable Lead Connection Status: 753985
Implantable Lead Implant Date: 20210305
Implantable Lead Implant Date: 20210305
Implantable Lead Implant Date: 20210305
Implantable Lead Location: 753858
Implantable Lead Location: 753859
Implantable Lead Location: 753860
Implantable Pulse Generator Implant Date: 20210305
Lead Channel Impedance Value: 1150 Ohm
Lead Channel Impedance Value: 460 Ohm
Lead Channel Impedance Value: 480 Ohm
Lead Channel Pacing Threshold Amplitude: 0.75 V
Lead Channel Pacing Threshold Amplitude: 0.875 V
Lead Channel Pacing Threshold Amplitude: 1.875 V
Lead Channel Pacing Threshold Pulse Width: 0.5 ms
Lead Channel Pacing Threshold Pulse Width: 0.5 ms
Lead Channel Pacing Threshold Pulse Width: 0.8 ms
Lead Channel Sensing Intrinsic Amplitude: 12 mV
Lead Channel Sensing Intrinsic Amplitude: 3.6 mV
Lead Channel Setting Pacing Amplitude: 1.75 V
Lead Channel Setting Pacing Amplitude: 1.875
Lead Channel Setting Pacing Amplitude: 2.375
Lead Channel Setting Pacing Pulse Width: 0.5 ms
Lead Channel Setting Pacing Pulse Width: 0.8 ms
Lead Channel Setting Sensing Sensitivity: 0.5 mV
Pulse Gen Serial Number: 111018177
Zone Setting Status: 755011

## 2023-02-11 DIAGNOSIS — E559 Vitamin D deficiency, unspecified: Secondary | ICD-10-CM | POA: Diagnosis not present

## 2023-02-11 DIAGNOSIS — E1169 Type 2 diabetes mellitus with other specified complication: Secondary | ICD-10-CM | POA: Diagnosis not present

## 2023-02-11 DIAGNOSIS — E782 Mixed hyperlipidemia: Secondary | ICD-10-CM | POA: Diagnosis not present

## 2023-02-12 LAB — LAB REPORT - SCANNED
A1c: 7.4
Albumin, Urine POC: 3.5
Creatinine, POC: 84.1 mg/dL
Microalb Creat Ratio: 4

## 2023-02-17 DIAGNOSIS — E559 Vitamin D deficiency, unspecified: Secondary | ICD-10-CM | POA: Diagnosis not present

## 2023-02-17 DIAGNOSIS — E1169 Type 2 diabetes mellitus with other specified complication: Secondary | ICD-10-CM | POA: Diagnosis not present

## 2023-02-17 DIAGNOSIS — I480 Paroxysmal atrial fibrillation: Secondary | ICD-10-CM | POA: Diagnosis not present

## 2023-02-17 DIAGNOSIS — E875 Hyperkalemia: Secondary | ICD-10-CM | POA: Diagnosis not present

## 2023-02-17 DIAGNOSIS — E782 Mixed hyperlipidemia: Secondary | ICD-10-CM | POA: Diagnosis not present

## 2023-02-17 DIAGNOSIS — I420 Dilated cardiomyopathy: Secondary | ICD-10-CM | POA: Diagnosis not present

## 2023-02-17 DIAGNOSIS — I10 Essential (primary) hypertension: Secondary | ICD-10-CM | POA: Diagnosis not present

## 2023-02-17 DIAGNOSIS — E119 Type 2 diabetes mellitus without complications: Secondary | ICD-10-CM | POA: Diagnosis not present

## 2023-02-17 DIAGNOSIS — I441 Atrioventricular block, second degree: Secondary | ICD-10-CM | POA: Diagnosis not present

## 2023-02-17 DIAGNOSIS — I5042 Chronic combined systolic (congestive) and diastolic (congestive) heart failure: Secondary | ICD-10-CM | POA: Diagnosis not present

## 2023-02-17 DIAGNOSIS — I110 Hypertensive heart disease with heart failure: Secondary | ICD-10-CM | POA: Diagnosis not present

## 2023-02-20 NOTE — Progress Notes (Signed)
Remote ICD transmission.   

## 2023-03-19 ENCOUNTER — Other Ambulatory Visit: Payer: Self-pay | Admitting: Cardiology

## 2023-03-19 DIAGNOSIS — I480 Paroxysmal atrial fibrillation: Secondary | ICD-10-CM

## 2023-04-29 DIAGNOSIS — H5203 Hypermetropia, bilateral: Secondary | ICD-10-CM | POA: Diagnosis not present

## 2023-04-29 DIAGNOSIS — H2513 Age-related nuclear cataract, bilateral: Secondary | ICD-10-CM | POA: Diagnosis not present

## 2023-04-29 DIAGNOSIS — H01001 Unspecified blepharitis right upper eyelid: Secondary | ICD-10-CM | POA: Diagnosis not present

## 2023-04-29 DIAGNOSIS — H524 Presbyopia: Secondary | ICD-10-CM | POA: Diagnosis not present

## 2023-04-29 DIAGNOSIS — H01002 Unspecified blepharitis right lower eyelid: Secondary | ICD-10-CM | POA: Diagnosis not present

## 2023-04-29 DIAGNOSIS — E119 Type 2 diabetes mellitus without complications: Secondary | ICD-10-CM | POA: Diagnosis not present

## 2023-04-29 DIAGNOSIS — H43813 Vitreous degeneration, bilateral: Secondary | ICD-10-CM | POA: Diagnosis not present

## 2023-05-01 ENCOUNTER — Ambulatory Visit (INDEPENDENT_AMBULATORY_CARE_PROVIDER_SITE_OTHER): Payer: Medicare Other

## 2023-05-01 DIAGNOSIS — I441 Atrioventricular block, second degree: Secondary | ICD-10-CM | POA: Diagnosis not present

## 2023-05-02 LAB — CUP PACEART REMOTE DEVICE CHECK
Battery Remaining Longevity: 48 mo
Battery Remaining Percentage: 51 %
Battery Voltage: 2.93 V
Brady Statistic AP VP Percent: 7.9 %
Brady Statistic AP VS Percent: 1 %
Brady Statistic AS VP Percent: 92 %
Brady Statistic AS VS Percent: 1 %
Brady Statistic RA Percent Paced: 7.7 %
Date Time Interrogation Session: 20250228023607
HighPow Impedance: 75 Ohm
Implantable Lead Connection Status: 753985
Implantable Lead Connection Status: 753985
Implantable Lead Connection Status: 753985
Implantable Lead Implant Date: 20210305
Implantable Lead Implant Date: 20210305
Implantable Lead Implant Date: 20210305
Implantable Lead Location: 753858
Implantable Lead Location: 753859
Implantable Lead Location: 753860
Implantable Pulse Generator Implant Date: 20210305
Lead Channel Impedance Value: 1025 Ohm
Lead Channel Impedance Value: 430 Ohm
Lead Channel Impedance Value: 450 Ohm
Lead Channel Pacing Threshold Amplitude: 0.75 V
Lead Channel Pacing Threshold Amplitude: 0.875 V
Lead Channel Pacing Threshold Amplitude: 2 V
Lead Channel Pacing Threshold Pulse Width: 0.5 ms
Lead Channel Pacing Threshold Pulse Width: 0.5 ms
Lead Channel Pacing Threshold Pulse Width: 0.8 ms
Lead Channel Sensing Intrinsic Amplitude: 12 mV
Lead Channel Sensing Intrinsic Amplitude: 3 mV
Lead Channel Setting Pacing Amplitude: 1.75 V
Lead Channel Setting Pacing Amplitude: 1.875
Lead Channel Setting Pacing Amplitude: 2.5 V
Lead Channel Setting Pacing Pulse Width: 0.5 ms
Lead Channel Setting Pacing Pulse Width: 0.8 ms
Lead Channel Setting Sensing Sensitivity: 0.5 mV
Pulse Gen Serial Number: 111018177
Zone Setting Status: 755011

## 2023-05-06 ENCOUNTER — Other Ambulatory Visit (HOSPITAL_COMMUNITY): Payer: Self-pay

## 2023-05-11 NOTE — Progress Notes (Unsigned)
 Cardiology Office Note:    Date:  05/12/2023   ID:  Richard Martin, DOB November 17, 1940, MRN [id number]  PCP:  Benita Stabile, MD  Cardiologist:  Little Ishikawa, MD  Electrophysiologist:  Regan Lemming, MD   Referring MD: Benita Stabile, MD   Chief Complaint  Patient presents with   Congestive Heart Failure    History of Present Illness:    Richard Martin is a 83 y.o. male with a hx of diabetes, chronic combined systolic and diastolic heart failure and second-degree Mobitz 2 AV block presents for follow-up.  He was initially seen on 12/31/2018 after being referred by Dr. Margo Aye for evaluation of EKG abnormalities and syncopal episode.  He had a episode of syncope where he was walking to his kitchen and had sudden loss of consciousness.  He was seen in clinic on 12/31/2018, and rhythm strip demonstrated intermittent 2:1 AV block, with a wide QRS.  He was referred to EP for evaluation for pacemaker, and an echocardiogram was ordered.  TTE was done on 01/04/2019, which showed EF 40 to 45%, with aneurysmal basal inferior segment.  TTE was also notable for grade 2 diastolic dysfunction, normal RV function, no significant valvular disease.  Given new systolic dysfunction, cardiac catheterization was done on 01/13/2019, which showed normal coronary arteries.  Cardiac MRI was done on 02/11/2019, which showed EF 35%, basal inferior aneurysm, transmural basal inferior LGE, basal inferoseptal mid wall LGE.  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  Zio patch x14 days was completed, which showed frequent second-degree Mobitz 2 AV block, with symptoms of dizziness corresponding to Mobitz 2 AV block.  He underwent cardiac PET at [facility] on 04/01/2019, which showed no active myocardial inflammation.  He underwent BiV ICD placement by Dr. Elberta Fortis on 05/06/2019.  Since CRT-D placed, reports lightheadedness/syncope has resolved and dyspnea/fatigue significantly improved.  Echocardiogram on 08/13/2020 showed EF 40  to 45%, inferior basal aneurysm, normal RV function, no significant valvular disease.  Echocardiogram 06/12/2022 showed EF 50 to 55%, normal RV function, RVSP 46, moderate TR.  Since last clinic visit, he reports he is doing well.  Denies any chest pain, dyspnea, lightheadedness, syncope, lower extremity edema, or palpitations.  Does report some fatigue.  He is taking Eliquis, denies any bleeding issues.    Wt Readings from Last 3 Encounters:  05/12/23 128 lb 9.6 oz (58.3 kg)  11/13/22 123 lb 6.4 oz (56 kg)  09/20/22 125 lb 7.1 oz (56.9 kg)   BP Readings from Last 3 Encounters:  05/12/23 120/62  11/13/22 124/76  09/20/22 123/74    Past Medical History:  Diagnosis Date   Cardiac resynchronization therapy defibrillator (CRT-D) in place    a. 05/2019 s/p SJM Gallant HF CRT-D [id number] (ser# [id number]).   Chronic combined systolic (congestive) and diastolic (congestive) heart failure (HCC)    a. 01/2019 Echo: EF 40-45%, focal basal inf aneurysmal region. Gr2 DD. Nl RV fxn. Mildly dil RA. Mod TR. Triv PR; b. 02/2019 cMRI: EF 35%.   Diabetes mellitus without complication (HCC)    LBBB (left bundle branch block)    NICM (nonischemic cardiomyopathy) (HCC)    a. 01/2019 Echo: EF 40-45%; b. 01/2019 Cath: nl cors. EF 35-45%; c. 02/2019 cMRI: Basal inf and basal infsept midwall LGE. EF 35%; d. 05/2019 s/p SJM Gallant HF CRT-D [id number] (ser# [id number]).    Past Surgical History:  Procedure Laterality Date   BIV ICD INSERTION CRT-D N/A 05/06/2019   Procedure: BIV ICD INSERTION CRT-D;  Surgeon: Regan Lemming, MD;  Location: [facility] INVASIVE CV LAB;  Service: Cardiovascular;  Laterality: N/A;   LEFT HEART CATH AND CORONARY ANGIOGRAPHY N/A 01/13/2019   Procedure: LEFT HEART CATH AND CORONARY ANGIOGRAPHY;  Surgeon: Swazil[place], Peter M, MD;  Location: [facility] INVASIVE CV LAB;  Service: Cardiovascular;  Laterality: N/A;    Current Medications: Current Meds  Medication Sig   acetaminophen (TYLENOL) 500 MG  tablet Take 500 mg by mouth daily as needed for moderate pain or headache.   apixaban (ELIQUIS) 2.5 MG TABS tablet Take 1 tablet (2.5 mg total) by mouth 2 (two) times daily.   carvedilol (COREG) 12.5 MG tablet Take 0.5 tablets (6.25 mg total) by mouth 2 (two) times daily with a meal.   cholecalciferol (VITAMIN D3) 25 MCG (1000 UT) tablet Take 1,000 Units by mouth daily.   FARXIGA 10 MG TABS tablet Take 10 mg by mouth daily. Take 1 tablet by mouth every day   lisinopril (ZESTRIL) 20 MG tablet TAKE (1) TABLET BY MOUTH ONCE DAILY.   metFORMIN (GLUCOPHAGE) 500 MG tablet Take 1 tablet (500 mg total) by mouth 2 (two) times daily.   polyvinyl alcohol (LIQUIFILM TEARS) 1.4 % ophthalmic solution Apply to eye.   rosuvastatin (CRESTOR) 10 MG tablet Take 10 mg by mouth daily.    spironolactone (ALDACTONE) 25 MG tablet TAKE 1/2 TABLET BY MOUTH DAILY.   zinc gluconate 50 MG tablet Take 50 mg by mouth daily.     Allergies:   [name] allergy]   Social History   Socioeconomic History   Marital status: Married    Spouse name: Not on file   Number of children: Not on file   Years of education: Not on file   Highest education level: Not on file  Occupational History   Not on file  Tobacco Use   Smoking status: Former    Current packs/day: 0.00    Types: Cigarettes    Quit date: 2002    Years since quitting: 23.2   Smokeless tobacco: Never   Tobacco comments:    Quit 2002  Substance and Sexual Activity   Alcohol use: No   Drug use: No   Sexual activity: Not Currently    Partners: Female    Comment: married  Other Topics Concern   Not on file  Social History Narrative   Not on file   Social Drivers of Health   Financial Resource Strain: Not on file  Food Insecurity: No Food Insecurity (09/15/2022)   Hunger Vital Sign    Worried About Running Out of Food in the Last Year: Never true    Ran Out of Food in the Last Year: Never true  Transportation Needs: No Transportation Needs  (09/15/2022)   PRAPARE - Administrator, Civil Service (Medical): No    Lack of Transportation (Non-Medical): No  Physical Activity: Not on file  Stress: Not on file  Social Connections: Not on file     Family History: Father died of MI at age 52  ROS:   Please see the history of present illness.    All other systems reviewed and are negative.  EKGs/Labs/Other Studies Reviewed:    The following studies were reviewed today:   EKG:   03/05/21: Sinus rhythm, BiV paced, rate 72 05/12/22: atrial sensed, BiV paced, rate 79 05/12/2023: Atrial sensed, ventricular paced rhythm, rate 77    TTE 01/04/19:  1. Left ventricular ejection fraction, by visual estimation, is 40 to 45%. The  left ventricle has normal function. There is no left ventricular hypertrophy.  2. Basal inferior segment is abnormal.  3. Left ventricular diastolic parameters are consistent with Grade II diastolic dysfunction (pseudonormalization).  4. Global right ventricle has normal systolic function.The right ventricular size is mildly enlarged. No increase in right ventricular wall thickness.  5. Left atrial size was normal.  6. Right atrial size was mildly dilated.  7. The mitral valve is normal in structure. No evidence of mitral valve regurgitation. No evidence of mitral stenosis.  8. The tricuspid valve is normal in structure. Tricuspid valve regurgitation moderate.  9. The aortic valve is normal in structure. Aortic valve regurgitation is not visualized. No evidence of aortic valve sclerosis or stenosis. 10. The pulmonic valve was normal in structure. Pulmonic valve regurgitation is trivial. 11. Mildly elevated pulmonary artery systolic pressure. 12. The inferior vena cava is normal in size with greater than 50% respiratory variability, suggesting right atrial pressure of 3 mmHg. 13. Focal basal inferior aneursymal region/infarct. 14. There is redundancy of the interatrial septum.  LHC 01/13/19: There is  moderate left ventricular systolic dysfunction. LV end diastolic pressure is normal. The left ventricular ejection fraction is 35-45% by visual estimate.   1. Normal coronary anatomy 2. Moderate LV dysfunction 3. inferobasal aneurysm 4. Normal LVEDP   CMR 02/11/19: 1. 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 While transmural LGE suggests ischemic etiology, it is not in a coronary distribution and patient had normal coronaries on recent cath. 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 2. Mild LV dilatation with moderate systolic dysfunction (EF 35%). Basal inferior aneurysm 3.  Normal RV size and systolic function (EF 62%)  Cardiac monitor 02/13/19: Frequent second degree Mobitz II AV block. Symptoms of dizziness corresponded to Mobitz II AV block   14 days of data recorded on Zio monitor. Patient had a min HR of 29 bpm, max HR of 123 bpm, and avg HR of 69 bpm. Predominant underlying rhythm was Sinus Rhythm.  No VT, SVT, atrial fibrillation, or pauses noted. Isolated atrial and ventricular ectopy was rare (<1%).  8523 episodes of second degree Mobitz II AV block.  Patient triggered events, with symptoms reported of dizziness, corresponded to Mobitz II AV block.    Cardiac PET 04/01/19:  Scattered bilateral pulmonary nodules, largest in the right lower lobe measuring up to 1.1 cm with minimal metabolic activity,  which may represent atelectasis; recommend follow up chest CT in 3-6 months to assess for resolution.  FINAL COMMENTS  Rb-82 Cardiac PET/CT Myocardial Perfusion Imaging Study:   Abnormal rest perfusion in 4 out of 17 segments of the myocardium in a non "vascular" territory. This is consistent with  "scar/fibrosis" of the myocardium due to prior inflamation/infiltrative disease. It is not coronary artery disease related.   Rb-82  Cardiac PET/CT Functonal Imaging Study:   Sever decreased in left ventricular function. Dilated left ventricle. These findings are compatible with a nonischemic  cardiomyopathy.   F-18 FDG Cardiac PET/CT Myocardial Metabolic Study:   No evidence of active inflamation of the myocardium. No evidence of active inflamatory process of the myocardium to suggest  active sarcoid or active myocarditis.   Limited F-18 FDG PET/CT of the Chest and Abdomen:   Note that evaluation is somewhat limited due to lack of IV contrast and slice thickness, within this limitation:   The thyroid is normal in appearance.  No mediastinal, hilar, or axillary lymphadenopathy. There are tiny mediastinal lymph nodes which do not meet size criteria for  pathologic enlargement. There are severe coronary artery and moderate thoracic aortic calcifications. No pericardial effusion.  The esophagus is normal in appearance.   No pleural effusion. No pneumothorax. Severe bilateral centrilobular and paraseptal emphysema. There are basal predominant  subpleural reticulations. There are scattered bilateral pulmonary nodules measuring up to 1.1 cm, for example:   Nodule #1: Right upper lobe nodule measuring 6 mm (series 3, image 80).  Nodule #2: Left upper lobe pulmonary nodule measuring 5 mm (series 3, image 77).  Nodule #3: Right lower lobe subpleural nodule measuring 1.1 cm (series 3, image 110) (SUV max 1.4).   Liver has a normal contour. Multifocal hypodense liver lesions without significant metabolic activity, likely cysts. Gallbladder,  pancreas, and bilateral adrenal glands are normal in appearance. Punctate splenic calcifications, likely sequela of granulomatous  disease.   Bilateral kidneys are normal in size without evidence of hydronephrosis. Right sided nephrolithiasis.   Visualized stomach, small bowel, large bowel is normal in caliber. No evidence of focal wall thickening.   Visualized abdominal aorta is  normal in caliber. No suspicious osseous lesions.  Recent Labs: 09/20/2022: ALT 41; Hemoglobin 12.8; Platelets 247 11/13/2022: Magnesium 2.1 11/20/2022: BUN 17; Creatinine, Ser 0.72; Potassium 4.8; Sodium 139  Recent Lipid Panel    Component Value Date/Time   CHOL 123 01/10/2019 1027   TRIG 66 01/10/2019 1027   HDL 52 01/10/2019 1027   CHOLHDL 2.4 01/10/2019 1027   LDLCALC 57 01/10/2019 1027    Physical Exam:    VS:  BP 120/62   Pulse 77   Ht 5\' 9"  (1.753 m)   Wt 128 lb 9.6 oz (58.3 kg)   SpO2 96%   BMI 18.99 kg/m     Wt Readings from Last 3 Encounters:  05/12/23 128 lb 9.6 oz (58.3 kg)  11/13/22 123 lb 6.4 oz (56 kg)  09/20/22 125 lb 7.1 oz (56.9 kg)     GEN:  Well nourished, well developed in no acute distress HEENT: Normal NECK: No JVD CARDIAC: RRR, no murmurs, rubs, gallops RESPIRATORY:  Clear to auscultation without rales, wheezing or rhonchi  ABDOMEN: Soft, non-tender, non-distended MUSCULOSKELETAL:  No edema SKIN: Warm and dry NEUROLOGIC:  Alert and oriented x 3 PSYCHIATRIC:  Normal affect   ASSESSMENT:    1. Chronic combined systolic and diastolic CHF (congestive heart failure) (HCC)   2. Paroxysmal atrial fibrillation (HCC)   3. Hyperlipidemia, unspecified hyperlipidemia type   4. Second degree Mobitz II AV block       PLAN:    Chronic combined systolic and diastolic heart failure: Normal coronary arteries on cath.  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  No evidence of active myocardial inflammation on cardiac PET at [facility] on 04/01/2019.  Status post CRT-D placement by Dr. Elberta Fortis on 05/06/2019.  Echocardiogram on 08/13/2020 showed EF 40 to 45%, inferior basal aneurysm, normal RV function, no significant valvular disease.  Echocardiogram 06/12/2022 showed EF 50 to 55%, normal RV function, RVSP 46, moderate TR. - Continue lisinopril 20 mg daily.  Has not been on Entresto due to hyperkalemia -  Continue carvedilol 6.25 mg twice daily - Continue Farxiga 10 mg daily - Continue spironolactone 12.5 mg daily - Follows with EP for ICD management -Recent BMET 02/2023 unremarkable  Paroxysmal atrial fibrillation: Noted on device interrogation, most recent interrogation 10/2022 showed burden less  than 1%. CHA2DS2-VASc score 5 (CHF, hypertension, age x2, T2DM) -Continue Eliquis 2.5 mg twice daily (reduced dose given age 38 and weight 60 kg).   -Continue Coreg 6.25 mg twice daily   Second-degree Mobitz 2 AV block: Presented with syncope.  Zio patch confirmed frequent episodes of second-degree Mobitz 2 AV block with symptoms of dizziness corresponding to periods of Mobitz 2. S/p CRT-D on 05/06/19   Hyperlipidemia: On rosuvastatin 10 mg daily.  LDL 83 on 10/15/22   Type 2 diabetes: A1c 7.4% on 02/11/2022.  On Metformin and Farxiga  Pulmonary nodules/COPD: Scattered bilateral nodules noted on PET/CT at [facility], recommended chest CT follow-up in 3 to 6 months.  CT chest 07/07/2019 showed several small pulmonary nodules, along with moderate to severe emphysema.  Referred to pulmonology for further evaluation, had PFTs which showed mild emphysema.  CT chest 08/2022 showed stable pulmonary nodules   RTC in 6 months    Medication Adjustments/Labs and Tests Ordered: Current medicines are reviewed at length with the patient today.  Concerns regarding medicines are outlined above.  Orders Placed This Encounter  Procedures   EKG 12-Lead   No orders of the defined types were placed in this encounter.   Patient Instructions  Medication Instructions:  No Changes *If you need a refill on your cardiac medications before your next appointment, please call your pharmacy*  Lab Work: None  Testing/Procedures: None   Follow-Up: At [facility], you and your health needs are our priority.  As part of our continuing mission to provide you with exceptional heart care, we have created designated  Provider Care Teams.  These Care Teams include your primary Cardiologist (physician) and Advanced Practice Providers (APPs -  Physician Assistants and Nurse Practitioners) who all work together to provide you with the care you need, when you need it.  Your next appointment:   6 month(s)  Provider:   Little Ishikawa, MD        Signed, Little Ishikawa, MD  05/12/2023 10:02 PM    [facility]

## 2023-05-12 ENCOUNTER — Encounter: Payer: Self-pay | Admitting: Cardiology

## 2023-05-12 ENCOUNTER — Ambulatory Visit: Payer: Medicare Other | Attending: Cardiology | Admitting: Cardiology

## 2023-05-12 VITALS — BP 120/62 | HR 77 | Ht 69.0 in | Wt 128.6 lb

## 2023-05-12 DIAGNOSIS — I480 Paroxysmal atrial fibrillation: Secondary | ICD-10-CM

## 2023-05-12 DIAGNOSIS — I5042 Chronic combined systolic (congestive) and diastolic (congestive) heart failure: Secondary | ICD-10-CM

## 2023-05-12 DIAGNOSIS — R079 Chest pain, unspecified: Secondary | ICD-10-CM

## 2023-05-12 DIAGNOSIS — I441 Atrioventricular block, second degree: Secondary | ICD-10-CM | POA: Diagnosis not present

## 2023-05-12 DIAGNOSIS — E785 Hyperlipidemia, unspecified: Secondary | ICD-10-CM

## 2023-05-12 NOTE — Patient Instructions (Signed)
 Medication Instructions:  No Changes *If you need a refill on your cardiac medications before your next appointment, please call your pharmacy*  Lab Work: None  Testing/Procedures: None   Follow-Up: At [facility], you and your health needs are our priority.  As part of our continuing mission to provide you with exceptional heart care, we have created designated Provider Care Teams.  These Care Teams include your primary Cardiologist (physician) and Advanced Practice Providers (APPs -  Physician Assistants and Nurse Practitioners) who all work together to provide you with the care you need, when you need it.  Your next appointment:   6 month(s)  Provider:   Little Ishikawa, MD

## 2023-05-18 DIAGNOSIS — R252 Cramp and spasm: Secondary | ICD-10-CM | POA: Diagnosis not present

## 2023-05-29 NOTE — Addendum Note (Signed)
 Addended by: Elease Etienne A on: 05/29/2023 09:32 AM   Modules accepted: Orders

## 2023-05-29 NOTE — Progress Notes (Signed)
 Remote ICD transmission.

## 2023-06-02 ENCOUNTER — Telehealth: Payer: Self-pay

## 2023-06-02 NOTE — Progress Notes (Signed)
   06/02/2023  Patient ID: Richard Martin, male   DOB: 19-Apr-1940, 83 y.o.   MRN: [id number]   HTA CSNP 90DS Conversion  Spoke with Ron about getting lisinopril, metformin, and rosuvastatin filled for 90DS going forwards. He gets these 30 days at a time from [facility] and is happy with their delivery service and consistency. He prefers to continue getting them for 30 days at a time. Confirmed also that he has an adequate supply of carvedilol (prescribed by cardiology) and Marcelline Deist which he gets from patient assistance program. I will continue monitoring adherence monthly.    Fayette Pho, PharmD

## 2023-06-04 ENCOUNTER — Other Ambulatory Visit: Payer: Self-pay | Admitting: Physician Assistant

## 2023-06-24 ENCOUNTER — Telehealth: Payer: Self-pay

## 2023-06-24 NOTE — Progress Notes (Signed)
   06/24/2023  Patient ID: Richard Martin, male   DOB: 04-06-1940, 83 y.o.   MRN: [id number]  Adherence Monitoring  Up to date on all fills, gets 30DS delivered by [facility] and he has all his medications synced. Prefers to continue with this 30 day delivery service.  -Lisinopril  20 mg - Last fill 30DS on 06/22/23, BP 102/46 on 02/17/23. Qualifies for metric this year, next fill due 07/22/23  -Metformin  500 mg - Last fill 30Ds on 06/22/23, A1C 7.4 on 02/11/23. Qualifies for metric this year, next fil due 07/22/23  -Rosuvastatin  10 mg - Last fill 30DS on 06/22/23, LDL 75 on 02/11/23. Qualifies for metric this year, next fill due 07/22/23  Plan:  Continue reviewing adherence monthly, next fill review on 07/24/23.   Flint Hummer, PharmD

## 2023-06-30 DIAGNOSIS — L821 Other seborrheic keratosis: Secondary | ICD-10-CM | POA: Diagnosis not present

## 2023-06-30 DIAGNOSIS — D225 Melanocytic nevi of trunk: Secondary | ICD-10-CM | POA: Diagnosis not present

## 2023-06-30 DIAGNOSIS — Z85828 Personal history of other malignant neoplasm of skin: Secondary | ICD-10-CM | POA: Diagnosis not present

## 2023-06-30 DIAGNOSIS — L570 Actinic keratosis: Secondary | ICD-10-CM | POA: Diagnosis not present

## 2023-06-30 DIAGNOSIS — E559 Vitamin D deficiency, unspecified: Secondary | ICD-10-CM | POA: Diagnosis not present

## 2023-06-30 DIAGNOSIS — E782 Mixed hyperlipidemia: Secondary | ICD-10-CM | POA: Diagnosis not present

## 2023-06-30 DIAGNOSIS — E1169 Type 2 diabetes mellitus with other specified complication: Secondary | ICD-10-CM | POA: Diagnosis not present

## 2023-07-01 ENCOUNTER — Telehealth: Payer: Self-pay

## 2023-07-01 LAB — LAB REPORT - SCANNED
A1c: 7.2
Albumin, Urine POC: 4.5
Creatinine, POC: 50.7 mg/dL
EGFR: 89
Microalb Creat Ratio: 9

## 2023-07-01 NOTE — Progress Notes (Signed)
   07/01/2023  Patient ID: Richard Martin, male   DOB: 10-15-1940, 83 y.o.   MRN: [id number]   Adherence Monitoring:  Patient outreached me to let me know he had an oversupply of Eliquis , it appears he's been filling it consistently and confirms he takes it twice daily as prescribed. I reviewed his fill history for MAC/MAD/MAH drugs and he is still getting a consistent 30DS from [facility] pharmacy, last fills were 06/22/23. No adherence concerns, will continue monitoring adherence monthly.   Flint Hummer, PharmD

## 2023-07-07 DIAGNOSIS — I420 Dilated cardiomyopathy: Secondary | ICD-10-CM | POA: Diagnosis not present

## 2023-07-07 DIAGNOSIS — I5042 Chronic combined systolic (congestive) and diastolic (congestive) heart failure: Secondary | ICD-10-CM | POA: Diagnosis not present

## 2023-07-07 DIAGNOSIS — I110 Hypertensive heart disease with heart failure: Secondary | ICD-10-CM | POA: Diagnosis not present

## 2023-07-07 DIAGNOSIS — E1169 Type 2 diabetes mellitus with other specified complication: Secondary | ICD-10-CM | POA: Diagnosis not present

## 2023-07-07 DIAGNOSIS — E782 Mixed hyperlipidemia: Secondary | ICD-10-CM | POA: Diagnosis not present

## 2023-07-07 DIAGNOSIS — E871 Hypo-osmolality and hyponatremia: Secondary | ICD-10-CM | POA: Diagnosis not present

## 2023-07-07 DIAGNOSIS — E1159 Type 2 diabetes mellitus with other circulatory complications: Secondary | ICD-10-CM | POA: Diagnosis not present

## 2023-07-07 DIAGNOSIS — I10 Essential (primary) hypertension: Secondary | ICD-10-CM | POA: Diagnosis not present

## 2023-07-07 DIAGNOSIS — I441 Atrioventricular block, second degree: Secondary | ICD-10-CM | POA: Diagnosis not present

## 2023-07-07 DIAGNOSIS — I480 Paroxysmal atrial fibrillation: Secondary | ICD-10-CM | POA: Diagnosis not present

## 2023-07-07 DIAGNOSIS — E1165 Type 2 diabetes mellitus with hyperglycemia: Secondary | ICD-10-CM | POA: Diagnosis not present

## 2023-07-28 ENCOUNTER — Telehealth: Payer: Self-pay

## 2023-07-28 NOTE — Progress Notes (Signed)
   07/28/2023  Patient ID: Richard Martin, male   DOB: 1940-11-15, 83 y.o.   MRN: [id number]  Adherence Monitoring:  Up to date on meds, lisinopril  dose reduced, prescription sent in to match. Documentation in [employer].   Flint Hummer, PharmD

## 2023-07-31 ENCOUNTER — Ambulatory Visit (INDEPENDENT_AMBULATORY_CARE_PROVIDER_SITE_OTHER): Payer: Medicare Other

## 2023-07-31 DIAGNOSIS — I441 Atrioventricular block, second degree: Secondary | ICD-10-CM | POA: Diagnosis not present

## 2023-07-31 LAB — CUP PACEART REMOTE DEVICE CHECK
Battery Remaining Longevity: 46 mo
Battery Remaining Percentage: 48 %
Battery Voltage: 2.93 V
Brady Statistic AP VP Percent: 7.4 %
Brady Statistic AP VS Percent: 1 %
Brady Statistic AS VP Percent: 92 %
Brady Statistic AS VS Percent: 1 %
Brady Statistic RA Percent Paced: 7.2 %
Date Time Interrogation Session: 20250530030136
HighPow Impedance: 75 Ohm
Implantable Lead Connection Status: 753985
Implantable Lead Connection Status: 753985
Implantable Lead Connection Status: 753985
Implantable Lead Implant Date: 20210305
Implantable Lead Implant Date: 20210305
Implantable Lead Implant Date: 20210305
Implantable Lead Location: 753858
Implantable Lead Location: 753859
Implantable Lead Location: 753860
Implantable Pulse Generator Implant Date: 20210305
Lead Channel Impedance Value: 1175 Ohm
Lead Channel Impedance Value: 450 Ohm
Lead Channel Impedance Value: 490 Ohm
Lead Channel Pacing Threshold Amplitude: 0.75 V
Lead Channel Pacing Threshold Amplitude: 0.875 V
Lead Channel Pacing Threshold Amplitude: 2.125 V
Lead Channel Pacing Threshold Pulse Width: 0.5 ms
Lead Channel Pacing Threshold Pulse Width: 0.5 ms
Lead Channel Pacing Threshold Pulse Width: 0.8 ms
Lead Channel Sensing Intrinsic Amplitude: 11.9 mV
Lead Channel Sensing Intrinsic Amplitude: 2.1 mV
Lead Channel Setting Pacing Amplitude: 1.75 V
Lead Channel Setting Pacing Amplitude: 1.875
Lead Channel Setting Pacing Amplitude: 2.625
Lead Channel Setting Pacing Pulse Width: 0.5 ms
Lead Channel Setting Pacing Pulse Width: 0.8 ms
Lead Channel Setting Sensing Sensitivity: 0.5 mV
Pulse Gen Serial Number: 111018177
Zone Setting Status: 755011

## 2023-08-02 ENCOUNTER — Ambulatory Visit: Payer: Self-pay | Admitting: Cardiology

## 2023-08-25 ENCOUNTER — Ambulatory Visit: Attending: Cardiology | Admitting: Cardiology

## 2023-08-25 ENCOUNTER — Encounter: Payer: Self-pay | Admitting: Cardiology

## 2023-08-25 VITALS — BP 96/60 | HR 68 | Ht 69.0 in | Wt 126.0 lb

## 2023-08-25 DIAGNOSIS — I5022 Chronic systolic (congestive) heart failure: Secondary | ICD-10-CM | POA: Diagnosis not present

## 2023-08-25 DIAGNOSIS — I441 Atrioventricular block, second degree: Secondary | ICD-10-CM

## 2023-08-25 DIAGNOSIS — I480 Paroxysmal atrial fibrillation: Secondary | ICD-10-CM

## 2023-08-25 DIAGNOSIS — D6869 Other thrombophilia: Secondary | ICD-10-CM | POA: Diagnosis not present

## 2023-08-25 NOTE — Progress Notes (Signed)
  Electrophysiology Office Note:   Date:  08/25/2023  ID:  Richard Martin, Richard Martin Feb 12, 1941, MRN [id number]  Primary Cardiologist: Lonni LITTIE Nanas, MD Primary Heart Failure: None Electrophysiologist: Simmone Cape Gladis Norton, MD      History of Present Illness:   Richard Martin is a 83 y.o. male with h/o diabetes, chronic systolic heart failure, 2-1 AV block seen today for routine electrophysiology followup.   Since last being seen in our clinic the patient reports doing well.  He has no chest pain or shortness of breath.  He has no acute complaints.  He is able to all of his daily activities.  He thinks that he has slowed down a little bit over the last few years.  He is no longer climbing under his house to do work.  he denies chest pain, palpitations, dyspnea, PND, orthopnea, nausea, vomiting, dizziness, syncope, edema, weight gain, or early satiety.   Review of systems complete and found to be negative unless listed in HPI.      EP Information / Studies Reviewed:    EKG is not ordered today. EKG from 05/12/2023 reviewed which showed atrial sensed, ventricular paced      ICD Interrogation-  reviewed in detail today,  See PACEART report.  Device History: Abbott BiV ICD implanted 05/06/2019 for chronic systolic heart failure and 2-1 AV block History of appropriate therapy: No History of AAD therapy: No   Risk Assessment/Calculations:    CHA2DS2-VASc Score = 5   This indicates a 7.2% annual risk of stroke. The patient's score is based upon: CHF History: 1 HTN History: 1 Diabetes History: 1 Stroke History: 0 Vascular Disease History: 0 Age Score: 2 Gender Score: 0             Physical Exam:   VS:  BP 96/60 (BP Location: Left Arm, Patient Position: Sitting, Cuff Size: Normal)   Pulse 68   Ht 5' 9 (1.753 m)   Wt 126 lb (57.2 kg)   SpO2 95%   BMI 18.61 kg/m    Wt Readings from Last 3 Encounters:  08/25/23 126 lb (57.2 kg)  05/12/23 128 lb 9.6 oz (58.3 kg)   11/13/22 123 lb 6.4 oz (56 kg)     GEN: Well nourished, well developed in no acute distress NECK: No JVD; No carotid bruits CARDIAC: Regular rate and rhythm, no murmurs, rubs, gallops RESPIRATORY:  Clear to auscultation without rales, wheezing or rhonchi  ABDOMEN: Soft, non-tender, non-distended EXTREMITIES:  No edema; No deformity   ASSESSMENT AND PLAN:    Chronic systolic dysfunction s/p Abbott CRT-D  euvolemic today Stable on an appropriate medical regimen Normal ICD function See Pace Art report Sensing, threshold, impedance within normal limits Programming reviewed and appropriate No changes today  2.  Second-degree 2:1 AV block: Post CRT-D as above  3.  Paroxysmal atrial fibrillation: Noted on device interrogation.  Minimal burden.  4.  Secondary hypercoagulable state: On Eliquis  for atrial fibrillation.    Disposition:   Follow up with EP APP in 12 months   Signed, Taryll Reichenberger Gladis Norton, MD

## 2023-09-16 NOTE — Progress Notes (Signed)
 Remote ICD transmission.

## 2023-09-24 ENCOUNTER — Telehealth: Payer: Self-pay

## 2023-09-24 NOTE — Telephone Encounter (Signed)
 Overdue on meds but out of refills, collaborated with PCP to send additional refills to pharmacy

## 2023-10-01 ENCOUNTER — Telehealth: Payer: Self-pay

## 2023-10-01 NOTE — Telephone Encounter (Signed)
 Up to date on meds, next review in August

## 2023-10-20 ENCOUNTER — Other Ambulatory Visit: Payer: Self-pay | Admitting: Cardiology

## 2023-10-30 ENCOUNTER — Ambulatory Visit (INDEPENDENT_AMBULATORY_CARE_PROVIDER_SITE_OTHER)

## 2023-10-30 ENCOUNTER — Ambulatory Visit: Payer: Medicare Other

## 2023-10-30 DIAGNOSIS — I5022 Chronic systolic (congestive) heart failure: Secondary | ICD-10-CM

## 2023-10-31 LAB — CUP PACEART REMOTE DEVICE CHECK
Battery Remaining Longevity: 41 mo
Battery Remaining Percentage: 44 %
Battery Voltage: 2.93 V
Brady Statistic AP VP Percent: 2.8 %
Brady Statistic AP VS Percent: 1 %
Brady Statistic AS VP Percent: 97 %
Brady Statistic AS VS Percent: 1 %
Brady Statistic RA Percent Paced: 2.4 %
Date Time Interrogation Session: 20250829020015
HighPow Impedance: 65 Ohm
Implantable Lead Connection Status: 753985
Implantable Lead Connection Status: 753985
Implantable Lead Connection Status: 753985
Implantable Lead Implant Date: 20210305
Implantable Lead Implant Date: 20210305
Implantable Lead Implant Date: 20210305
Implantable Lead Location: 753858
Implantable Lead Location: 753859
Implantable Lead Location: 753860
Implantable Pulse Generator Implant Date: 20210305
Lead Channel Impedance Value: 1025 Ohm
Lead Channel Impedance Value: 430 Ohm
Lead Channel Impedance Value: 430 Ohm
Lead Channel Pacing Threshold Amplitude: 0.75 V
Lead Channel Pacing Threshold Amplitude: 0.875 V
Lead Channel Pacing Threshold Amplitude: 2 V
Lead Channel Pacing Threshold Pulse Width: 0.5 ms
Lead Channel Pacing Threshold Pulse Width: 0.5 ms
Lead Channel Pacing Threshold Pulse Width: 0.8 ms
Lead Channel Sensing Intrinsic Amplitude: 12 mV
Lead Channel Sensing Intrinsic Amplitude: 5 mV
Lead Channel Setting Pacing Amplitude: 1.75 V
Lead Channel Setting Pacing Amplitude: 1.875
Lead Channel Setting Pacing Amplitude: 2.5 V
Lead Channel Setting Pacing Pulse Width: 0.5 ms
Lead Channel Setting Pacing Pulse Width: 0.8 ms
Lead Channel Setting Sensing Sensitivity: 0.5 mV
Pulse Gen Serial Number: 111018177
Zone Setting Status: 755011

## 2023-11-02 ENCOUNTER — Ambulatory Visit

## 2023-11-03 DIAGNOSIS — E782 Mixed hyperlipidemia: Secondary | ICD-10-CM | POA: Diagnosis not present

## 2023-11-03 DIAGNOSIS — E559 Vitamin D deficiency, unspecified: Secondary | ICD-10-CM | POA: Diagnosis not present

## 2023-11-03 DIAGNOSIS — E1169 Type 2 diabetes mellitus with other specified complication: Secondary | ICD-10-CM | POA: Diagnosis not present

## 2023-11-04 LAB — LAB REPORT - SCANNED
A1c: 7.1
Albumin, Urine POC: 52.9
Creatinine, POC: 81.3 mg/dL
EGFR: 88
Microalb Creat Ratio: 65

## 2023-11-05 ENCOUNTER — Ambulatory Visit: Payer: Self-pay | Admitting: Cardiology

## 2023-11-08 NOTE — Progress Notes (Unsigned)
 Cardiology Office Note:    Date:  11/10/2023   ID:  Richard Martin, DOB Dec 05, 1940, MRN [id number]  PCP:  Shona Norleen PEDLAR, MD  Cardiologist:  Lonni LITTIE Nanas, MD  Electrophysiologist:  Soyla Gladis Norton, MD   Referring MD: Shona Norleen PEDLAR, MD   Chief Complaint  Patient presents with   Congestive Heart Failure    History of Present Illness:    Richard Martin is a 83 y.o. male with a hx of diabetes, chronic combined systolic and diastolic heart failure and second-degree Mobitz 2 AV block presents for follow-up.  He was initially seen on 12/31/2018 after being referred by Dr. Shona for evaluation of EKG abnormalities and syncopal episode.  He had a episode of syncope where he was walking to his kitchen and had sudden loss of consciousness.  He was seen in clinic on 12/31/2018, and rhythm strip demonstrated intermittent 2:1 AV block, with a wide QRS.  He was referred to EP for evaluation for pacemaker, and an echocardiogram was ordered.  TTE was done on 01/04/2019, which showed EF 40 to 45%, with aneurysmal basal inferior segment.  TTE was also notable for grade 2 diastolic dysfunction, normal RV function, no significant valvular disease.  Given new systolic dysfunction, cardiac catheterization was done on 01/13/2019, which showed normal coronary arteries.  Cardiac MRI was done on 02/11/2019, which showed EF 35%, basal inferior aneurysm, transmural basal inferior LGE, basal inferoseptal mid wall LGE.  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  Zio patch x14 days was completed, which showed frequent second-degree Mobitz 2 AV block, with symptoms of dizziness corresponding to Mobitz 2 AV block.  He underwent cardiac PET at [facility] on 04/01/2019, which showed no active myocardial inflammation.  He underwent BiV ICD placement by Dr. Norton on 05/06/2019.  Since CRT-D placed, reports lightheadedness/syncope has resolved and dyspnea/fatigue significantly improved.  Echocardiogram on 08/13/2020 showed EF 40  to 45%, inferior basal aneurysm, normal RV function, no significant valvular disease.  Echocardiogram 06/12/2022 showed EF 50 to 55%, normal RV function, RVSP 46, moderate TR.  Since last clinic visit, he reports he is doing okay. Denies any chest pain, dyspnea, lower extremity edema, or palpitations.  Does report if standing too quickly can feel lightheaded but denies any syncope.  Did have a fall 3 to 4 months ago where he stood too quickly and then fell onto his wife.  Denies any injuries.  He is on Eliquis , denies any bleeding issues.   Wt Readings from Last 3 Encounters:  11/10/23 129 lb (58.5 kg)  08/25/23 126 lb (57.2 kg)  05/12/23 128 lb 9.6 oz (58.3 kg)   BP Readings from Last 3 Encounters:  11/10/23 (!) 102/50  08/25/23 96/60  05/12/23 120/62    Past Medical History:  Diagnosis Date   Cardiac resynchronization therapy defibrillator (CRT-D) in place    a. 05/2019 s/p SJM Gallant HF CRT-D [id number] (ser# [id number]).   Chronic combined systolic (congestive) and diastolic (congestive) heart failure (HCC)    a. 01/2019 Echo: EF 40-45%, focal basal inf aneurysmal region. Gr2 DD. Nl RV fxn. Mildly dil RA. Mod TR. Triv PR; b. 02/2019 cMRI: EF 35%.   Diabetes mellitus without complication (HCC)    LBBB (left bundle branch block)    NICM (nonischemic cardiomyopathy) (HCC)    a. 01/2019 Echo: EF 40-45%; b. 01/2019 Cath: nl cors. EF 35-45%; c. 02/2019 cMRI: Basal inf and basal infsept midwall LGE. EF 35%; d. 05/2019 s/p SJM Gallant HF CRT-D [id number] (ser#  [id number]).    Past Surgical History:  Procedure Laterality Date   BIV ICD INSERTION CRT-D N/A 05/06/2019   Procedure: BIV ICD INSERTION CRT-D;  Surgeon: Inocencio Soyla Lunger, MD;  Location: [facility] INVASIVE CV LAB;  Service: Cardiovascular;  Laterality: N/A;   LEFT HEART CATH AND CORONARY ANGIOGRAPHY N/A 01/13/2019   Procedure: LEFT HEART CATH AND CORONARY ANGIOGRAPHY;  Surgeon: [place]az[place], Peter M, MD;  Location: [facility] INVASIVE CV LAB;  Service:  Cardiovascular;  Laterality: N/A;    Current Medications: Current Meds  Medication Sig   acetaminophen  (TYLENOL ) 500 MG tablet Take 500 mg by mouth daily as needed for moderate pain or headache.   apixaban  (ELIQUIS ) 2.5 MG TABS tablet Take 1 tablet (2.5 mg total) by mouth 2 (two) times daily.   carvedilol  (COREG ) 12.5 MG tablet TAKE (1) TABLET BY MOUTH TWICE DAILY WITH MEALS.   cholecalciferol  (VITAMIN D3) 25 MCG (1000 UT) tablet Take 1,000 Units by mouth daily.   FARXIGA 10 MG TABS tablet Take 10 mg by mouth daily. Take 1 tablet by mouth every day   lisinopril  (ZESTRIL ) 10 MG tablet Take 1 tablet (10 mg total) by mouth daily.   metFORMIN  (GLUCOPHAGE ) 500 MG tablet Take 1 tablet (500 mg total) by mouth 2 (two) times daily.   polyvinyl alcohol (LIQUIFILM TEARS) 1.4 % ophthalmic solution Apply to eye.   rosuvastatin  (CRESTOR ) 10 MG tablet Take 10 mg by mouth daily.    spironolactone  (ALDACTONE ) 25 MG tablet TAKE 1/2 TABLET BY MOUTH DAILY.   zinc  gluconate 50 MG tablet Take 50 mg by mouth daily.   [DISCONTINUED] amLODipine  (NORVASC ) 10 MG tablet Take 1 tablet (10 mg total) by mouth daily.   [DISCONTINUED] lisinopril  (ZESTRIL ) 20 MG tablet TAKE (1) TABLET BY MOUTH ONCE DAILY.     Allergies:   Georgianne cerise allergy]   Social History   Socioeconomic History   Marital status: Married    Spouse name: Not on file   Number of children: Not on file   Years of education: Not on file   Highest education level: Not on file  Occupational History   Not on file  Tobacco Use   Smoking status: Former    Current packs/day: 0.00    Types: Cigarettes    Quit date: 2002    Years since quitting: 23.7   Smokeless tobacco: Never   Tobacco comments:    Quit 2002  Substance and Sexual Activity   Alcohol use: No   Drug use: No   Sexual activity: Not Currently    Partners: Female    Comment: married  Other Topics Concern   Not on file  Social History Narrative   Not on file   Social Drivers  of Health   Financial Resource Strain: Not on file  Food Insecurity: No Food Insecurity (09/15/2022)   Hunger Vital Sign    Worried About Running Out of Food in the Last Year: Never true    Ran Out of Food in the Last Year: Never true  Transportation Needs: No Transportation Needs (09/15/2022)   PRAPARE - Administrator, Civil Service (Medical): No    Lack of Transportation (Non-Medical): No  Physical Activity: Not on file  Stress: Not on file  Social Connections: Not on file     Family History: Father died of MI at age 26  ROS:   Please see the history of present illness.    All other systems reviewed and are negative.  EKGs/Labs/Other Studies Reviewed:  The following studies were reviewed today:   EKG:   03/05/21: Sinus rhythm, BiV paced, rate 72 05/12/22: atrial sensed, BiV paced, rate 79 05/12/2023: Atrial sensed, ventricular paced rhythm, rate 77 11/10/2023: Atrial sensed, ventricular paced, rate 73   TTE 01/04/19:  1. Left ventricular ejection fraction, by visual estimation, is 40 to 45%. The left ventricle has normal function. There is no left ventricular hypertrophy.  2. Basal inferior segment is abnormal.  3. Left ventricular diastolic parameters are consistent with Grade II diastolic dysfunction (pseudonormalization).  4. Global right ventricle has normal systolic function.The right ventricular size is mildly enlarged. No increase in right ventricular wall thickness.  5. Left atrial size was normal.  6. Right atrial size was mildly dilated.  7. The mitral valve is normal in structure. No evidence of mitral valve regurgitation. No evidence of mitral stenosis.  8. The tricuspid valve is normal in structure. Tricuspid valve regurgitation moderate.  9. The aortic valve is normal in structure. Aortic valve regurgitation is not visualized. No evidence of aortic valve sclerosis or stenosis. 10. The pulmonic valve was normal in structure. Pulmonic valve  regurgitation is trivial. 11. Mildly elevated pulmonary artery systolic pressure. 12. The inferior vena cava is normal in size with greater than 50% respiratory variability, suggesting right atrial pressure of 3 mmHg. 13. Focal basal inferior aneursymal region/infarct. 14. There is redundancy of the interatrial septum.  LHC 01/13/19: There is moderate left ventricular systolic dysfunction. LV end diastolic pressure is normal. The left ventricular ejection fraction is 35-45% by visual estimate.   1. Normal coronary anatomy 2. Moderate LV dysfunction 3. inferobasal aneurysm 4. Normal LVEDP   CMR 02/11/19: 1. 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 While transmural LGE suggests ischemic etiology, it is not in a coronary distribution and patient had normal coronaries on recent cath. 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 2. Mild LV dilatation with moderate systolic dysfunction (EF 35%). Basal inferior aneurysm 3.  Normal RV size and systolic function (EF 62%)  Cardiac monitor 02/13/19: Frequent second degree Mobitz II AV block. Symptoms of dizziness corresponded to Mobitz II AV block   14 days of data recorded on Zio monitor. Patient had a min HR of 29 bpm, max HR of 123 bpm, and avg HR of 69 bpm. Predominant underlying rhythm was Sinus Rhythm.  No VT, SVT, atrial fibrillation, or pauses noted. Isolated atrial and ventricular ectopy was rare (<1%).  8523 episodes of second degree Mobitz II AV block.  Patient triggered events, with symptoms reported of dizziness, corresponded to Mobitz II AV block.    Cardiac PET 04/01/19:  Scattered bilateral pulmonary nodules, largest in the right lower lobe measuring up to 1.1 cm with minimal metabolic activity,  which may represent atelectasis; recommend follow up chest CT in 3-6 months to assess for  resolution.  FINAL COMMENTS  Rb-82 Cardiac PET/CT Myocardial Perfusion Imaging Study:   Abnormal rest perfusion in 4 out of 17 segments of the myocardium in a non vascular territory. This is consistent with  scar/fibrosis of the myocardium due to prior inflamation/infiltrative disease. It is not coronary artery disease related.   Rb-82 Cardiac PET/CT Functonal Imaging Study:   Sever decreased in left ventricular function. Dilated left ventricle. These findings are compatible with a nonischemic  cardiomyopathy.   F-18 FDG Cardiac PET/CT Myocardial Metabolic Study:   No evidence of active inflamation of  the myocardium. No evidence of active inflamatory process of the myocardium to suggest  active sarcoid or active myocarditis.   Limited F-18 FDG PET/CT of the Chest and Abdomen:   Note that evaluation is somewhat limited due to lack of IV contrast and slice thickness, within this limitation:   The thyroid  is normal in appearance.   No mediastinal, hilar, or axillary lymphadenopathy. There are tiny mediastinal lymph nodes which do not meet size criteria for  pathologic enlargement. There are severe coronary artery and moderate thoracic aortic calcifications. No pericardial effusion.  The esophagus is normal in appearance.   No pleural effusion. No pneumothorax. Severe bilateral centrilobular and paraseptal emphysema. There are basal predominant  subpleural reticulations. There are scattered bilateral pulmonary nodules measuring up to 1.1 cm, for example:   Nodule #1: Right upper lobe nodule measuring 6 mm (series 3, image 80).  Nodule #2: Left upper lobe pulmonary nodule measuring 5 mm (series 3, image 77).  Nodule #3: Right lower lobe subpleural nodule measuring 1.1 cm (series 3, image 110) (SUV max 1.4).   Liver has a normal contour. Multifocal hypodense liver lesions without significant metabolic activity, likely cysts. Gallbladder,  pancreas, and bilateral adrenal glands are  normal in appearance. Punctate splenic calcifications, likely sequela of granulomatous  disease.   Bilateral kidneys are normal in size without evidence of hydronephrosis. Right sided nephrolithiasis.   Visualized stomach, small bowel, large bowel is normal in caliber. No evidence of focal wall thickening.   Visualized abdominal aorta is normal in caliber. No suspicious osseous lesions.  Recent Labs: 11/13/2022: Magnesium 2.1 11/20/2022: BUN 17; Creatinine, Ser 0.72; Potassium 4.8; Sodium 139  Recent Lipid Panel    Component Value Date/Time   CHOL 123 01/10/2019 1027   TRIG 66 01/10/2019 1027   HDL 52 01/10/2019 1027   CHOLHDL 2.4 01/10/2019 1027   LDLCALC 57 01/10/2019 1027    Physical Exam:    VS:  BP (!) 102/50   Pulse 73   Ht 5' 10 (1.778 m)   Wt 129 lb (58.5 kg)   SpO2 95%   BMI 18.51 kg/m     Wt Readings from Last 3 Encounters:  11/10/23 129 lb (58.5 kg)  08/25/23 126 lb (57.2 kg)  05/12/23 128 lb 9.6 oz (58.3 kg)     GEN:  Well nourished, well developed in no acute distress HEENT: Normal NECK: No JVD CARDIAC: RRR, no murmurs, rubs, gallops RESPIRATORY:  Clear to auscultation without rales, wheezing or rhonchi  ABDOMEN: Soft, non-tender, non-distended MUSCULOSKELETAL:  No edema SKIN: Warm and dry NEUROLOGIC:  Alert and oriented x 3 PSYCHIATRIC:  Normal affect   ASSESSMENT:    1. Chronic combined systolic and diastolic heart failure (HCC)   2. Paroxysmal atrial fibrillation (HCC)   3. Hyperlipidemia, unspecified hyperlipidemia type     PLAN:    Chronic combined systolic and diastolic heart failure: Normal coronary arteries on cath.  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  No evidence of active myocardial inflammation on cardiac PET at [facility] on 04/01/2019.  Status post CRT-D placement by Dr. Inocencio on 05/06/2019.  Echocardiogram on 08/13/2020 showed EF 40 to 45%, inferior basal aneurysm, normal RV  function, no significant valvular disease.  Echocardiogram 06/12/2022 showed EF 50 to 55%, normal RV function, RVSP 46, moderate TR. - Continue lisinopril , recommend decrease dose to 10 mg daily given soft BP and recent lightheadedness.  Has not been on Entresto due to hyperkalemia -  Continue carvedilol  12.5 mg twice daily - Continue Farxiga 10 mg daily - Continue spironolactone  12.5 mg daily - Follows with EP for ICD management - Recent BMET 11/03/2023 unremarkable  Paroxysmal atrial fibrillation: Noted on device interrogation, most recent interrogation 10/2022 showed burden less than 1%. CHA2DS2-VASc score 5 (CHF, hypertension, age x2, T2DM) -Continue Eliquis  2.5 mg twice daily (reduced dose given age and weight) -Continue Coreg  12.5 mg twice daily   Second-degree Mobitz 2 AV block: Presented with syncope.  Zio patch confirmed frequent episodes of second-degree Mobitz 2 AV block with symptoms of dizziness corresponding to periods of Mobitz 2. S/p CRT-D on 05/06/19   Hyperlipidemia: On rosuvastatin  10 mg daily.  LDL 60 on 11/03/2023   Type 2 diabetes: A1c 7.1% on 11/03/2023.  On Metformin  and [place]  Pulmonary nodules/COPD: Scattered bilateral nodules noted on PET/CT at [facility], recommended chest CT follow-up in 3 to 6 months.  CT chest 07/07/2019 showed several small pulmonary nodules, along with moderate to severe emphysema.  Referred to pulmonology for further evaluation, had PFTs which showed mild emphysema.  CT chest 08/2022 showed stable pulmonary nodules   RTC in 6 months    Medication Adjustments/Labs and Tests Ordered: Current medicines are reviewed at length with the patient today.  Concerns regarding medicines are outlined above.  Orders Placed This Encounter  Procedures   EKG 12-Lead   Meds ordered this encounter  Medications   DISCONTD: amLODipine  (NORVASC ) 10 MG tablet    Sig: Take 1 tablet (10 mg total) by mouth daily.    Dispense:  90 tablet    Refill:  3   lisinopril  (ZESTRIL )  10 MG tablet    Sig: Take 1 tablet (10 mg total) by mouth daily.    Dispense:  90 tablet    Refill:  3    Patient Instructions  Medication Instructions:  Your physician recommends that you continue on your current medications as directed. Please refer to the Current Medication list given to you today.   Decrease lisinopril  10 mg daily as directed by your provider *If you need a refill on your cardiac medications before your next appointment, please call your pharmacy*  Lab Work: none If you have labs (blood work) drawn today and your tests are completely normal, you will receive your results only by: MyChart Message (if you have MyChart) OR A paper copy in the mail If you have any lab test that is abnormal or we need to change your treatment, we will call you to review the results.  Testing/Procedures: none  Follow-Up: At [facility], you and your health needs are our priority.  As part of our continuing mission to provide you with exceptional heart care, our providers are all part of one team.  This team includes your primary Cardiologist (physician) and Advanced Practice Providers or APPs (Physician Assistants and Nurse Practitioners) who all work together to provide you with the care you need, when you need it.  Your next appointment:   6 months  Provider:   Dr. Kate  We recommend signing up for the patient portal called MyChart.  Sign up information is provided on this After Visit Summary.  MyChart is used to connect with patients for Virtual Visits (Telemedicine).  Patients are able to view lab/test results, encounter notes, upcoming appointments, etc.  Non-urgent messages can be sent to your provider as well.   To learn more about what you can do with MyChart, go to [email or web address].   Other Instructions  Please  call office and let us  know what medication an doses you are taking.    Signed, Lonni LITTIE Nanas, MD  11/10/2023 2:24 PM    [facility]

## 2023-11-09 NOTE — Progress Notes (Signed)
Remote ICD Transmission.

## 2023-11-10 ENCOUNTER — Encounter: Payer: Self-pay | Admitting: Cardiology

## 2023-11-10 ENCOUNTER — Ambulatory Visit: Attending: Cardiology | Admitting: Cardiology

## 2023-11-10 VITALS — BP 102/50 | HR 73 | Ht 70.0 in | Wt 129.0 lb

## 2023-11-10 DIAGNOSIS — I110 Hypertensive heart disease with heart failure: Secondary | ICD-10-CM | POA: Diagnosis not present

## 2023-11-10 DIAGNOSIS — I480 Paroxysmal atrial fibrillation: Secondary | ICD-10-CM | POA: Diagnosis not present

## 2023-11-10 DIAGNOSIS — I5042 Chronic combined systolic (congestive) and diastolic (congestive) heart failure: Secondary | ICD-10-CM

## 2023-11-10 DIAGNOSIS — E559 Vitamin D deficiency, unspecified: Secondary | ICD-10-CM | POA: Diagnosis not present

## 2023-11-10 DIAGNOSIS — E785 Hyperlipidemia, unspecified: Secondary | ICD-10-CM

## 2023-11-10 DIAGNOSIS — I441 Atrioventricular block, second degree: Secondary | ICD-10-CM | POA: Diagnosis not present

## 2023-11-10 DIAGNOSIS — E1169 Type 2 diabetes mellitus with other specified complication: Secondary | ICD-10-CM | POA: Diagnosis not present

## 2023-11-10 DIAGNOSIS — E119 Type 2 diabetes mellitus without complications: Secondary | ICD-10-CM | POA: Diagnosis not present

## 2023-11-10 DIAGNOSIS — I420 Dilated cardiomyopathy: Secondary | ICD-10-CM | POA: Diagnosis not present

## 2023-11-10 DIAGNOSIS — Z23 Encounter for immunization: Secondary | ICD-10-CM | POA: Diagnosis not present

## 2023-11-10 DIAGNOSIS — I10 Essential (primary) hypertension: Secondary | ICD-10-CM | POA: Diagnosis not present

## 2023-11-10 DIAGNOSIS — E875 Hyperkalemia: Secondary | ICD-10-CM | POA: Diagnosis not present

## 2023-11-10 MED ORDER — AMLODIPINE BESYLATE 10 MG PO TABS: 10.0000 mg | ORAL_TABLET | Freq: Every day | ORAL | 3 refills | Status: DC

## 2023-11-10 MED ORDER — LISINOPRIL 10 MG PO TABS: 10.0000 mg | ORAL_TABLET | Freq: Every day | ORAL | 3 refills | Status: AC

## 2023-11-10 NOTE — Patient Instructions (Addendum)
 Medication Instructions:  Your physician recommends that you continue on your current medications as directed. Please refer to the Current Medication list given to you today.   Decrease lisinopril  10 mg daily as directed by your provider *If you need a refill on your cardiac medications before your next appointment, please call your pharmacy*  Lab Work: none If you have labs (blood work) drawn today and your tests are completely normal, you will receive your results only by: MyChart Message (if you have MyChart) OR A paper copy in the mail If you have any lab test that is abnormal or we need to change your treatment, we will call you to review the results.  Testing/Procedures: none  Follow-Up: At [facility], you and your health needs are our priority.  As part of our continuing mission to provide you with exceptional heart care, our providers are all part of one team.  This team includes your primary Cardiologist (physician) and Advanced Practice Providers or APPs (Physician Assistants and Nurse Practitioners) who all work together to provide you with the care you need, when you need it.  Your next appointment:   6 months  Provider:   Dr. Kate  We recommend signing up for the patient portal called MyChart.  Sign up information is provided on this After Visit Summary.  MyChart is used to connect with patients for Virtual Visits (Telemedicine).  Patients are able to view lab/test results, encounter notes, upcoming appointments, etc.  Non-urgent messages can be sent to your provider as well.   To learn more about what you can do with MyChart, go to [email or web address].   Other Instructions  Please call office and let us  know what medication an doses you are taking.

## 2024-01-18 ENCOUNTER — Other Ambulatory Visit: Payer: Self-pay | Admitting: Cardiology

## 2024-01-18 DIAGNOSIS — I480 Paroxysmal atrial fibrillation: Secondary | ICD-10-CM

## 2024-02-01 ENCOUNTER — Ambulatory Visit

## 2024-02-01 DIAGNOSIS — I480 Paroxysmal atrial fibrillation: Secondary | ICD-10-CM

## 2024-02-02 LAB — CUP PACEART REMOTE DEVICE CHECK
Battery Remaining Longevity: 35 mo
Battery Remaining Percentage: 42 %
Battery Voltage: 2.92 V
Brady Statistic AP VP Percent: 3.8 %
Brady Statistic AP VS Percent: 1 %
Brady Statistic AS VP Percent: 96 %
Brady Statistic AS VS Percent: 1 %
Brady Statistic RA Percent Paced: 3.3 %
Date Time Interrogation Session: 20251201020010
HighPow Impedance: 77 Ohm
Implantable Lead Connection Status: 753985
Implantable Lead Connection Status: 753985
Implantable Lead Connection Status: 753985
Implantable Lead Implant Date: 20210305
Implantable Lead Implant Date: 20210305
Implantable Lead Implant Date: 20210305
Implantable Lead Location: 753858
Implantable Lead Location: 753859
Implantable Lead Location: 753860
Implantable Pulse Generator Implant Date: 20210305
Lead Channel Impedance Value: 1050 Ohm
Lead Channel Impedance Value: 450 Ohm
Lead Channel Impedance Value: 450 Ohm
Lead Channel Pacing Threshold Amplitude: 0.875 V
Lead Channel Pacing Threshold Amplitude: 1 V
Lead Channel Pacing Threshold Amplitude: 2.25 V
Lead Channel Pacing Threshold Pulse Width: 0.5 ms
Lead Channel Pacing Threshold Pulse Width: 0.5 ms
Lead Channel Pacing Threshold Pulse Width: 0.8 ms
Lead Channel Sensing Intrinsic Amplitude: 12 mV
Lead Channel Sensing Intrinsic Amplitude: 4.3 mV
Lead Channel Setting Pacing Amplitude: 1.875
Lead Channel Setting Pacing Amplitude: 2 V
Lead Channel Setting Pacing Amplitude: 2.75 V
Lead Channel Setting Pacing Pulse Width: 0.5 ms
Lead Channel Setting Pacing Pulse Width: 0.8 ms
Lead Channel Setting Sensing Sensitivity: 0.5 mV
Pulse Gen Serial Number: 111018177
Zone Setting Status: 755011

## 2024-02-04 ENCOUNTER — Ambulatory Visit: Payer: Self-pay | Admitting: Cardiology

## 2024-02-05 NOTE — Progress Notes (Signed)
 Remote ICD Transmission

## 2024-02-24 ENCOUNTER — Other Ambulatory Visit: Payer: Self-pay | Admitting: Cardiology

## 2024-02-24 DIAGNOSIS — I480 Paroxysmal atrial fibrillation: Secondary | ICD-10-CM

## 2024-02-26 NOTE — Telephone Encounter (Signed)
 Prescription refill request for Eliquis  received. Indication:afib Last office visit:9/25 Scr: 0.81  9/25 Age:83 Weight:58.5  kg  Prescription refilled

## 2024-03-09 LAB — LAB REPORT - SCANNED
A1c: 7.3
Albumin, Urine POC: 6.7
Creatinine, POC: 85.1 mg/dL
EGFR: 87
Microalb Creat Ratio: 8

## 2024-04-29 ENCOUNTER — Ambulatory Visit

## 2024-05-02 ENCOUNTER — Ambulatory Visit

## 2024-07-29 ENCOUNTER — Ambulatory Visit

## 2024-08-01 ENCOUNTER — Ambulatory Visit

## 2024-10-28 ENCOUNTER — Ambulatory Visit

## 2024-10-31 ENCOUNTER — Ambulatory Visit

## 2025-01-30 ENCOUNTER — Ambulatory Visit
